# Patient Record
Sex: Female | Born: 1935 | Race: White | Hispanic: No | Marital: Married | Smoking: Never smoker
Health system: Southern US, Community
[De-identification: ages and names within clinical notes are randomized; demographics above are authoritative.]

## PROBLEM LIST (undated history)

## (undated) DIAGNOSIS — K649 Unspecified hemorrhoids: Secondary | ICD-10-CM

## (undated) DIAGNOSIS — F32A Depression, unspecified: Secondary | ICD-10-CM

## (undated) DIAGNOSIS — M8580 Other specified disorders of bone density and structure, unspecified site: Secondary | ICD-10-CM

## (undated) DIAGNOSIS — K602 Anal fissure, unspecified: Secondary | ICD-10-CM

## (undated) DIAGNOSIS — G4700 Insomnia, unspecified: Secondary | ICD-10-CM

## (undated) DIAGNOSIS — I10 Essential (primary) hypertension: Secondary | ICD-10-CM

## (undated) DIAGNOSIS — K589 Irritable bowel syndrome without diarrhea: Secondary | ICD-10-CM

## (undated) DIAGNOSIS — F419 Anxiety disorder, unspecified: Secondary | ICD-10-CM

## (undated) DIAGNOSIS — C4491 Basal cell carcinoma of skin, unspecified: Secondary | ICD-10-CM

## (undated) DIAGNOSIS — K5790 Diverticulosis of intestine, part unspecified, without perforation or abscess without bleeding: Secondary | ICD-10-CM

## (undated) DIAGNOSIS — G43909 Migraine, unspecified, not intractable, without status migrainosus: Secondary | ICD-10-CM

## (undated) DIAGNOSIS — H269 Unspecified cataract: Secondary | ICD-10-CM

## (undated) DIAGNOSIS — F329 Major depressive disorder, single episode, unspecified: Secondary | ICD-10-CM

## (undated) DIAGNOSIS — A0472 Enterocolitis due to Clostridium difficile, not specified as recurrent: Secondary | ICD-10-CM

## (undated) DIAGNOSIS — K645 Perianal venous thrombosis: Secondary | ICD-10-CM

## (undated) DIAGNOSIS — T7840XA Allergy, unspecified, initial encounter: Secondary | ICD-10-CM

## (undated) HISTORY — DX: Unspecified cataract: H26.9

## (undated) HISTORY — DX: Insomnia, unspecified: G47.00

## (undated) HISTORY — DX: Anxiety disorder, unspecified: F41.9

## (undated) HISTORY — DX: Essential (primary) hypertension: I10

## (undated) HISTORY — DX: Major depressive disorder, single episode, unspecified: F32.9

## (undated) HISTORY — PX: APPENDECTOMY: SHX54

## (undated) HISTORY — DX: Basal cell carcinoma of skin, unspecified: C44.91

## (undated) HISTORY — DX: Anal fissure, unspecified: K60.2

## (undated) HISTORY — DX: Diverticulosis of intestine, part unspecified, without perforation or abscess without bleeding: K57.90

## (undated) HISTORY — DX: Other specified disorders of bone density and structure, unspecified site: M85.80

## (undated) HISTORY — DX: Allergy, unspecified, initial encounter: T78.40XA

## (undated) HISTORY — DX: Perianal venous thrombosis: K64.5

## (undated) HISTORY — PX: BASAL CELL CARCINOMA EXCISION: SHX1214

## (undated) HISTORY — DX: Depression, unspecified: F32.A

## (undated) HISTORY — DX: Unspecified hemorrhoids: K64.9

---

## 1997-09-14 HISTORY — PX: CATARACT EXTRACTION, BILATERAL: SHX1313

## 1998-08-30 ENCOUNTER — Other Ambulatory Visit: Admission: RE | Admit: 1998-08-30 | Discharge: 1998-08-30 | Payer: Self-pay | Admitting: Internal Medicine

## 1999-09-05 ENCOUNTER — Other Ambulatory Visit: Admission: RE | Admit: 1999-09-05 | Discharge: 1999-09-05 | Payer: Self-pay | Admitting: Internal Medicine

## 1999-09-15 HISTORY — PX: COLON RESECTION: SHX5231

## 1999-09-18 ENCOUNTER — Encounter: Admission: RE | Admit: 1999-09-18 | Discharge: 1999-09-18 | Payer: Self-pay | Admitting: Psychiatry

## 1999-09-18 ENCOUNTER — Encounter: Payer: Self-pay | Admitting: Neurology

## 1999-11-12 ENCOUNTER — Inpatient Hospital Stay (HOSPITAL_COMMUNITY): Admission: AD | Admit: 1999-11-12 | Discharge: 1999-11-15 | Payer: Self-pay | Admitting: Gastroenterology

## 1999-11-12 ENCOUNTER — Encounter: Payer: Self-pay | Admitting: Gastroenterology

## 2000-01-12 ENCOUNTER — Encounter (HOSPITAL_BASED_OUTPATIENT_CLINIC_OR_DEPARTMENT_OTHER): Payer: Self-pay | Admitting: Internal Medicine

## 2000-01-12 ENCOUNTER — Encounter: Admission: RE | Admit: 2000-01-12 | Discharge: 2000-01-12 | Payer: Self-pay | Admitting: Internal Medicine

## 2000-01-15 ENCOUNTER — Encounter: Admission: RE | Admit: 2000-01-15 | Discharge: 2000-01-15 | Payer: Self-pay | Admitting: Internal Medicine

## 2000-01-15 ENCOUNTER — Encounter (HOSPITAL_BASED_OUTPATIENT_CLINIC_OR_DEPARTMENT_OTHER): Payer: Self-pay | Admitting: Internal Medicine

## 2000-01-31 ENCOUNTER — Encounter: Payer: Self-pay | Admitting: Emergency Medicine

## 2000-01-31 ENCOUNTER — Inpatient Hospital Stay (HOSPITAL_COMMUNITY): Admission: EM | Admit: 2000-01-31 | Discharge: 2000-02-01 | Payer: Self-pay | Admitting: Emergency Medicine

## 2000-03-18 ENCOUNTER — Encounter: Payer: Self-pay | Admitting: *Deleted

## 2000-03-18 ENCOUNTER — Encounter (INDEPENDENT_AMBULATORY_CARE_PROVIDER_SITE_OTHER): Payer: Self-pay | Admitting: *Deleted

## 2000-03-18 ENCOUNTER — Inpatient Hospital Stay (HOSPITAL_COMMUNITY): Admission: RE | Admit: 2000-03-18 | Discharge: 2000-03-26 | Payer: Self-pay | Admitting: *Deleted

## 2001-01-24 ENCOUNTER — Encounter (HOSPITAL_BASED_OUTPATIENT_CLINIC_OR_DEPARTMENT_OTHER): Payer: Self-pay | Admitting: Internal Medicine

## 2001-01-24 ENCOUNTER — Encounter: Admission: RE | Admit: 2001-01-24 | Discharge: 2001-01-24 | Payer: Self-pay | Admitting: Internal Medicine

## 2001-01-31 ENCOUNTER — Encounter (HOSPITAL_BASED_OUTPATIENT_CLINIC_OR_DEPARTMENT_OTHER): Payer: Self-pay | Admitting: Internal Medicine

## 2001-01-31 ENCOUNTER — Encounter: Admission: RE | Admit: 2001-01-31 | Discharge: 2001-01-31 | Payer: Self-pay | Admitting: Internal Medicine

## 2002-01-17 ENCOUNTER — Encounter: Payer: Self-pay | Admitting: Internal Medicine

## 2002-01-17 ENCOUNTER — Encounter: Admission: RE | Admit: 2002-01-17 | Discharge: 2002-01-17 | Payer: Self-pay | Admitting: Internal Medicine

## 2003-02-01 ENCOUNTER — Encounter: Payer: Self-pay | Admitting: Internal Medicine

## 2003-02-01 ENCOUNTER — Encounter: Admission: RE | Admit: 2003-02-01 | Discharge: 2003-02-01 | Payer: Self-pay | Admitting: Internal Medicine

## 2004-02-20 ENCOUNTER — Encounter: Admission: RE | Admit: 2004-02-20 | Discharge: 2004-02-20 | Payer: Self-pay | Admitting: Internal Medicine

## 2005-02-25 ENCOUNTER — Encounter: Admission: RE | Admit: 2005-02-25 | Discharge: 2005-02-25 | Payer: Self-pay | Admitting: Internal Medicine

## 2005-09-14 HISTORY — PX: TRANSANAL EXCISION OF RECTAL MASS WITH HEMORRHOID INJECTION: SHX6135

## 2006-03-03 ENCOUNTER — Encounter: Admission: RE | Admit: 2006-03-03 | Discharge: 2006-03-03 | Payer: Self-pay | Admitting: Internal Medicine

## 2007-03-07 ENCOUNTER — Encounter: Admission: RE | Admit: 2007-03-07 | Discharge: 2007-03-07 | Payer: Self-pay | Admitting: Internal Medicine

## 2007-03-16 ENCOUNTER — Encounter: Admission: RE | Admit: 2007-03-16 | Discharge: 2007-03-16 | Payer: Self-pay | Admitting: Internal Medicine

## 2007-09-19 ENCOUNTER — Encounter: Admission: RE | Admit: 2007-09-19 | Discharge: 2007-09-19 | Payer: Self-pay | Admitting: Internal Medicine

## 2008-03-07 ENCOUNTER — Encounter: Admission: RE | Admit: 2008-03-07 | Discharge: 2008-03-07 | Payer: Self-pay | Admitting: Internal Medicine

## 2009-03-08 ENCOUNTER — Encounter: Admission: RE | Admit: 2009-03-08 | Discharge: 2009-03-08 | Payer: Self-pay | Admitting: Internal Medicine

## 2010-03-10 ENCOUNTER — Encounter: Admission: RE | Admit: 2010-03-10 | Discharge: 2010-03-10 | Payer: Self-pay | Admitting: Internal Medicine

## 2011-01-30 NOTE — Op Note (Signed)
[place] [facility]  Patient:    Christina Escobar, Christina Escobar                    MRN: [id number] Proc. Date: 03/19/00 Adm. Date:  [id number] Attending:  Daine Gip CC:         Genene Churn. Sherin Quarry, M.D.             Jacqulynn Cadet Jennelle Human, M.D.             Thomas B. Samuella Cota, M.D.                           Operative Report  CCS NUMBER:  [id number].  PREOPERATIVE DIAGNOSIS:  Chronic sigmoid diverticulitis with stenosis.  POSTOPERATIVE DIAGNOSIS:  Chronic sigmoid diverticulitis with stenosis.  OPERATION:  Sigmoid colectomy, incidental appendectomy.  SURGEON:  Maisie Fus B. Samuella Cota, M.D.  ASSISTANTSheppard Plumber. Earlene Plater, M.D.  ANESTHESIA:  General, anesthesiologist and CRNA.  DESCRIPTION OF PROCEDURE:  Patient was taken to the operating room, placed on the table in supine position and after satisfactory general anesthetic with intubation, a Foley catheter was inserted and the entire abdomen was prepped and draped into a sterile field.  A midline incision was made just below the umbilicus to the suprapubic area.  The incision was taken through skin and subcutaneous tissue and the midline fascia was divided.  Exploration revealed a normal-feeling stomach, liver and the gallbladder felt normal without stones.  The small bowel was then run from the ligament of Treitz to the ileocecal valve, with no abnormality being noted.  Patient had a fair along appendix and an incidental appendectomy was carried out.  The mesoappendix was cross-clamped, divided and ligated with 2-0 black silk.  The appendix was then clamped and amputated.  The stump was tied with a 2-0 chromic catgut.  The stump was then inverted with a pursestring suture of 3-0 black silk.  The patient had adhesions of the sigmoid colon to the lateral peritoneal reflection.  Sigmoid colon was fairly redundant.  The adhesions were taken down.  The left ureter was identified.  A 20-cm segment of sigmoid colon was then removed  by dividing the mesocolon and ligating with 2-0 black silk; the bowel proximal appeared normal as did the distal bowel.  The bowel was divided probably at the area of the rectosigmoid.  The specimen was removed.  The anastomosis was then carried out as a single-layer end-to-end anastomosis using 2-0 silk, with the knots tied on the inside, except the last few sutures which were placed as Gambee sutures.  Liberal anastomosis was created.  The proximal crushed end of the bowel had been excised and the distal bowel was never clamped.  There appeared to be no undue tension on the anastomosis.  The mesenteric defect was closed with a running suture of 2-0 chromic catgut. Patient had normal-appearing tubes and ovaries and the uterus was small with only a couple of small fibroids.  The abdomen was copiously irrigated with saline and hemostasis was obtained.  The omentum and transverse colon were then placed over the small bowel.  The midline incision was closed with two running sutures of #1 PDS, with the knot tied in the middle.  The wound was copiously irrigated with saline and skin was closed with the skin staples. Dry sterile dressing was applied.  Patient seemed to tolerate the procedure well and was taken to the PACU in satisfactory condition.DD:  03/19/00  TD:  03/19/00 Job: [id number]

## 2011-01-30 NOTE — Discharge Summary (Signed)
[place]. [facility]  Patient:    Christina Escobar, Christina Escobar                    MRN: [id number] Adm. Date:  [id number] Disc. Date: [id number] Attending:  Daine Gip                           Discharge Summary  There was no dictation for this report. DD:  04/12/00 TD:  04/13/00 Job: [id number]

## 2011-01-30 NOTE — Discharge Summary (Signed)
[facility]  Patient:    Christina Escobar, Christina Escobar                    MRN: [id number] Adm. Date:  [id number] Disc. Date: [id number] Attending:  Daine Gip CC:         Genene Churn. Sherin Quarry, M.D.                           Discharge Summary  ADMITTING DIAGNOSES: Left lower abdominal pain, possible diverticulitis.  FINAL DIAGNOSES:  Left lower quadrant pain, possible diverticulitis, improved.  PROCEDURE:  CT of the abdomen and pelvis.  BRIEF HISTORY:  The patient was admitted overnight for left-sided abdominal pain. She had a history in the past of abdominal pain that was presumed to be diverticulitis. She was admitted with 1 day of severe lower abdominal pain when she came into the emergency room.  PHYSICAL EXAMINATION:  VITAL SIGNS:  Temperature of 100.3.  HEART:  Normal.  LUNGS:  Normal.  ABDOMEN:  Soft with good bowel sounds. Mild tenderness in the left lower quadrant with possible early rebound.  RECTAL:  There was a posterior anal fissure that was not actively bleeding.  For more details, please see dictated admission history and physical.  HOSPITAL COURSE:  The patient was admitted to the medical floor and placed on IV Unasyn. She was only allowed clear liquids. White count was 15.5 and subsequently the white count the following morning was 12.4.  CT scan showed mild diverticulosis without actual diverticulitis. By the following morning, the patient was clinically feeling much better and was tolerating liquids. Her exam was much less tender. The feeling was that she may well have had mild diverticulitis.  DISPOSITION:  The patient is discharged home to see Dr. Sherin Quarry in the office the following week.  DISCHARGE MEDICATIONS:  Augmentin 500 mg t.i.d. She will advance her diet as tolerated and continue all of her outpatient medicines. DD:  02/19/00 TD:  02/25/00 Job: [id number]

## 2011-01-30 NOTE — Discharge Summary (Signed)
[place]. [facility]  Patient:    Christina Escobar, Christina Escobar                    MRN: [id number] Adm. Date:  [id number] Disc. Date: [id number] Attending:  Daine Gip CC:         Genene Churn. Sherin Quarry, M.D.             Thomas B. Samuella Cota, M.D.                           Discharge Summary  CHIEF COMPLAINT:  Colonic diverticulitis.  HISTORY OF PRESENT ILLNESS:  This 77 year old white female was seen in consultation with Dr. Tasia Catchings.  The patient gives a history of developing clinical sigmoid diverticulitis in 1996.  She underwent a colonoscopy at that time, which showed diverticulosis.  She has never had a barium enema.  She has had further attacks of right lower quadrant pain associated with low-grade fever with elevated white count.  She had an attack in November 1999 and was hospitalized at [facility] from November 12, 1999 to November 15, 1999 and had another attack starting Jan 31, 2000, when she was seen in the emergency room and kept overnight and received IV fluids.  She began having some left lower quadrant abdominal pain in early June.  The patient was seen in the office on February 26, 2000.  At that time, she was on Septra DS two tablets a day.  The patient had a CT scan of the abdomen and pelvis in February 2001 when she was at [facility].  The patient describes her pain as being colicky abdominal pain associated with a temperature of usually around 100.  As mentioned above, she has never had a barium enema. Dr. Sherin Quarry thought it was reasonable to plan colonoscopy and then have her admitted to the hospital the same day with surgery planned the next day.  The patient has undergone colonoscopy today by Dr. Sherin Quarry and is now admitted for surgery tomorrow.  ALLERGIES:  No medication produces a rash but when she was taking CIPRO and FLAGYL for diverticulitis she developed severe diarrhea and was taken off of that after she was admitted to the  hospital.  CURRENT MEDICATIONS: 1. Accupril 40 mg daily. 2. Lazol 25 mg on Monday, Wednesday, and Friday. 3. Evista 60 mg daily. 4. Lorazepam 1 mg twice a day. 5. Remeron 30 mg daily for anxiety. 6. Imitrex 25 mg p.r.n. for headaches. 7. Ambien 10 mg h.s. p.r.n.  PAST SURGICAL HISTORY:  Bilateral cataract surgery in 1999.  HOSPITALIZATIONS:  Only in February 2001 at [facility] with diverticulitis.  REVIEW OF SYSTEMS:  She has had good results from her cataract surgery.  No recent sore throats or ear infections.  No history of thyroid disease.  Does not smoke and has never smoked.  No history of any cardiac problems.  The patient has never been jaundiced, had hepatitis, or infectious mono.  Bowel elimination is usually two to three times a day.  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  She is not using suppositories.  She has nocturia x 2.  SOCIAL HISTORY:  Her husband is 51 years old, living and well.  He is a retired Development worker, international aid.  They have one adopted son.  They also have a son and daughter.  PHYSICAL EXAMINATION:  VITAL  SIGNS:  Weight 102 pounds, temperature 97.  GENERAL:  The patient is a well-developed, well-nourished white female in no acute distress.  NECK:  Supple without thyroid enlargement.  CHEST:  Clear.  HEART:  No murmurs.  BREASTS:  No masses felt in either breast or axilla.  ABDOMEN:  Liver, spleen, and kidney not felt.  The patient had some minimal tenderness in the left lower quadrant of the abdomen.  Bowel sounds were normal.  RECTAL:  Only slight increase in sphincter tone.  She is somewhat tender in the posterior midline.  IMPRESSION:  Chronic sigmoid diverticulitis.  The patient had colonoscopy, as mentioned above.  She was taken to the operating room on March 19, 2000, at which time she underwent a sigmoid colectomy and an incidental appendectomy.  She had  adhesions about the sigmoid colon and about 20 cm of colon was removed.  Exploration otherwise unremarkable.  A nasogastric tube was not placed.  The patient was started on clear liquids on the third postoperative day, at which time the Foley catheter was also removed.  The patient was continued on a clear liquid diet until she was passing flatus well on the sixth postoperative day and she was progressed to a full liquid diet and was eating a regular diet on the seventh postoperative day, at which time she was discharged home improved.  The patients skin staples have been removed and the wound treated with benzoin and Steri-Strips.  There was no evidence of any wound infection.  LABORATORY DATA:  While in the hospital, it showed a hemoglobin of 11.4, hematocrit of 33.4, white count 6600.  Electrolytes were normal.  EKG showed normal sinus rhythm with first-degree AV block and an old septal infarct, age undetermined, abnormal EKG.  Chest x-ray on March 18, 2000 showed no active lung disease.  The final pathology report is on the chart and the report is as noted above.  FINAL DIAGNOSIS:  Sigmoid diverticulitis.  OPERATIONS:  March 19, 2000:  Sigmoid colectomy, incidental appendectomy.  CONDITION ON DISCHARGE:  Improved.  FOLLOW-UP:  The patient will be seen back in the office for a follow-up in about 10 days.  DISCHARGE MEDICATIONS:  She was given a prescription for Vicodin for pain. DD:  04/12/00 TD:  04/13/00 Job: [id number]

## 2011-01-30 NOTE — H&P (Signed)
[facility]  Patient:    Christina Escobar, Christina Escobar                    MRN: [id number] Adm. Date:  [id number] Disc. Date: [id number] Attending:  Daine Gip CC:         Genene Churn. Sherin Quarry, M.D.             Jonelle Sports. Cheryll Cockayne, M.D.                         History and Physical  REASON FOR ADMISSION:  Abdominal pain.  HISTORY OF PRESENT ILLNESS:  The patient is a 77 year old woman who is the wife of Dr. Junius Argyle.  She has a history of diverticulitis going back ten years  ago.  Approximately ten years ago, she had her first episode and then went about ten years or so without any further problems.  One year ago, she had en episode of diverticulitis treated clinically with improvement.  In November 2000 and February of 2001, she had subsequent episodes.  She denies chronic constipation and, in fact, since these past two episodes of diverticulitis, her stools have been on he loose side.  She was treated in February and March with medications and apparently had an MRI of her abdomen at [facility], all consistent with diverticulitis.  She was  intolerant of Cipro and Flagyl.  [place] and felt reasonably normal yesterday.  She had what she felt was a normal bowel  movement.  She was not constipated.  She had some solid stool, followed by several loose stools whenever she had a bowel movement.  She woke up at 3:00 a.m. this morning with lower abdominal pain.  She got up and went hiking, thinking this would help.  Her pain became aggressively worse hiking to the point that she was unable to continue to hike.  She and her husband drove back to [place].  She better  sitting in the car.  I checked her temperature at home and it was 100.8.  She has had no dysuria and very little flatus today.  With this history, we had her come to the ER for evaluation.  CURRENT MEDICATIONS:  Accupril 40 mg q.d.  Indapamide 2.5  mg Monday, Wednesday nd Friday.  Evista 60 mg q.d.  Lorazepam 1 mg b.i.d. p.r.n.  Imitrex p.r.n. migraine headaches.  Remeron p.r.n.  Ambien p.r.n.  ALLERGIES:  She is intolerant of Cipro and Flagyl, which made her shake and tremble.  She is intolerant of erythromycin, which gives her GI upset.  SHe has  been on penicillin in the past with no problems.  PAST MEDICAL HISTORY: 1. Hypertension. 2. History of migraine headaches. 3. Osteoporosis by bone density.  Never any previous symptoms.  PAST SURGICAL HISTORY:  Cataract surgery.  She has never had any abdominal surgery.  FAMILY HISTORY:  Her mother died of heart failure.  Her father died of Alzheimers. There is no family history of GI cancer.  Family history is negative for renal stones.  REVIEW OF SYSTEMS:  Remarkable for no significant heart disease.  She has had anal pain and occasional bleeding with bowel movement.  This has been going on several times over the past six months and has never really completely healed.  LABORATORY DATA:  Current available data reveals a white count of 15,000.  Acute abdominal series shows no evidence of free air.  PHYSICAL EXAMINATION;  VITAL SIGNS:  Temperature 100.3.  Pulse 107.  Blood pressure 138/84.  GENERAL:  Alert white female in no acute distress.  HEENT:  Sclerae anicteric.  Extraocular movements grossly intact.  Cerumen occluding the right ear canal.  Throat - No evidence of thrush.  Mucous membranes appear normal.  LUNGS:  Clear anteriorly and posteriorly.  HEART:  Regular rate and rhythm without murmurs or gallops.  ABDOMEN:  Soft with good bowel sounds.  There is tenderness in the lower abdomen with possible rebound that appears to be really in the midline, possibly more to the right.  RECTAL:  Posterior anal fissure that is not actively bleeding.  Stool brown and  heme negative.  There is tenderness in the rectal exam, more on the right than n the  left.  ASSESSMENT: 1. Lower abdominal pain, probably diverticulitis.  There is a questionable fullness    on the abdominal series and with tenderness on the right side as well as the    left.  I would be concerned about appendicitis, as well.  There is no free air    and no evidence of any acute perforation. 2. Posterior anal fissure.  PLAN: 1. Will go ahead and treat her empirically with Unasyn. 2. Will give her only sips of clear liquids. 3. Will obtain a CT of the abdomen and pelvis to rule out appendicitis. DD:  01/31/00 TD:  02/02/00 Job: [id number]

## 2011-02-12 ENCOUNTER — Other Ambulatory Visit: Payer: Self-pay | Admitting: Internal Medicine

## 2011-02-12 DIAGNOSIS — Z1231 Encounter for screening mammogram for malignant neoplasm of breast: Secondary | ICD-10-CM

## 2011-03-17 ENCOUNTER — Ambulatory Visit
Admission: RE | Admit: 2011-03-17 | Discharge: 2011-03-17 | Disposition: A | Discharge status: Home / Self Care | Payer: Medicare Other | Source: Ambulatory Visit | Attending: Internal Medicine | Admitting: Internal Medicine

## 2011-03-17 DIAGNOSIS — Z1231 Encounter for screening mammogram for malignant neoplasm of breast: Secondary | ICD-10-CM

## 2011-11-05 ENCOUNTER — Encounter (INDEPENDENT_AMBULATORY_CARE_PROVIDER_SITE_OTHER): Payer: Self-pay | Admitting: Surgery

## 2011-11-05 ENCOUNTER — Ambulatory Visit (INDEPENDENT_AMBULATORY_CARE_PROVIDER_SITE_OTHER): Payer: Medicare Other | Admitting: Surgery

## 2011-11-05 VITALS — BP 100/64 | HR 62 | Temp 96.8°F | Resp 16 | Ht 63.0 in | Wt 109.0 lb

## 2011-11-05 DIAGNOSIS — K645 Perianal venous thrombosis: Secondary | ICD-10-CM | POA: Insufficient documentation

## 2011-11-05 HISTORY — PX: INCISION AND DRAINAGE: SHX5863

## 2011-11-05 NOTE — Progress Notes (Signed)
Chief Complaint  Patient presents with  . New Evaluation    Thrombosed hemorrhoids - referral from Dr. Odelia Gage, [facility]    HISTORY: The patient is a 78 year old white female referred by her gastroenterologist for newly diagnosed thrombosed external hemorrhoids. Patient has previously been treated in our practice by my partner for internal hemorrhoid disease. That was approximately 6 years ago. Patient has not had prior external thrombosed hemorrhoids. Approximately 2 days ago following exercise she developed pain and swelling. She has had no bleeding. She was seen yesterday by her gastroenterologist and referred to our practice for evaluation.  Past Medical History  Diagnosis Date  . Hypertension   . Thrombosed hemorrhoids   . Cataracts, bilateral      Current Outpatient Prescriptions  Medication Sig Dispense Refill  . acetaminophen (TYLENOL) 500 MG tablet Take 500 mg by mouth as needed.      . Calcium Carbonate-Vitamin D (CALTRATE 600+D PO) Take by mouth 2 (two) times daily.      Marland Kitchen EVISTA 60 MG tablet Daily.      . Hydrocortisone Acetate (PROCTOZONE H RE) Place rectally as needed.      . indapamide (LOZOL) 2.5 MG tablet Daily.      . Loperamide HCl (IMODIUM PO) Take by mouth as needed.      Marland Kitchen LORazepam (ATIVAN) 0.5 MG tablet Daily.      . mirtazapine (REMERON) 45 MG tablet Daily.      . Multiple Vitamins-Minerals (CENTRUM SILVER PO) Take by mouth daily.      . polyethylene glycol powder (GLYCOLAX/MIRALAX) powder Daily.      . psyllium (METAMUCIL) 58.6 % packet Take 1 packet by mouth daily.      . quinapril (ACCUPRIL) 40 MG tablet Daily.      . SUMAtriptan (IMITREX) 25 MG tablet Take 25 mg by mouth as needed.      . zolpidem (AMBIEN) 10 MG tablet as needed.          No Known Allergies   Family History  Problem Relation Age of Onset  . Alzheimer's disease Mother   . Heart disease Father      History   Social History  . Marital Status: Married   Spouse Name: N/A    Number of Children: N/A  . Years of Education: N/A   Social History Main Topics  . Smoking status: Never Smoker   . Smokeless tobacco: Never Used  . Alcohol Use: Yes     occasional glass of wine  . Drug Use: No  . Sexually Active: None   Other Topics Concern  . None   Social History Narrative  . None     REVIEW OF SYSTEMS - PERTINENT POSITIVES ONLY: History of internal hemorrhoids. Previous colonoscopy. No recent bleeding. Moderate pain.  EXAM: Filed Vitals:   11/05/11 1515  BP: 100/64  Pulse: 62  Temp: 96.8 F (36 C)  Resp: 16    HEENT: normocephalic; pupils equal and reactive; sclerae clear; dentition good; mucous membranes moist NECK:  symmetric on extension; no palpable anterior or posterior cervical lymphadenopathy; no supraclavicular masses; no tenderness CHEST: clear to auscultation bilaterally without rales, rhonchi, or wheezes CARDIAC: regular rate and rhythm without significant murmur; peripheral pulses are full RECTAL: External examination shows an obvious thrombosed external hemorrhoid in the right mid anal verge. There is slight edema and prolapse in the posterior midline. There is no ulceration and no sign of bleeding. There is moderate tenderness. EXT:  non-tender without edema;  no deformity NEURO: no gross focal deficits; no sign of tremor   LABORATORY RESULTS: See [facility] (CHL-Epic) for most recent results   RADIOLOGY RESULTS: See [facility] (CHL-Epic) for most recent results  Procedure: Under aseptic conditions local anesthetic is infiltrated throughout the area. Using the iris scissors an elliptical incision is made. The vein is opened and a moderate amount of thrombus was evacuated as was some of the ectatic vein.  Dry gauze dressing is placed. Patient tolerated the procedure well.   IMPRESSION: #1 thrombosed external hemorrhoid #2 history of internal hemorrhoids  PLAN: Usual instructions for local wound  care are given to the patient. Patient will return as needed.  Velora Heckler, MD, FACS General & Endocrine Surgery [facility]   Visit Diagnoses: 1. Hemorrhoids, external, thrombosed     Primary Care Physician: Gaspar Garbe, MD, MD  GI:  Dr. Odelia Gage, [facility]

## 2011-11-05 NOTE — Patient Instructions (Signed)
ANORECTAL PROCEDURES: 1.  Tub soaks 2-3 times daily in warm water (may add Epsom salts if desired) 2.  Stool softener for one month (store brand Miralax or Colace) 3.  Avoid toilet paper - use baby wipes or Tucks pads 4.  Increase water intake - 6-8 glasses daily 5.  Apply dry pad to area until drainage stops 

## 2011-12-22 ENCOUNTER — Other Ambulatory Visit: Payer: Self-pay | Admitting: Internal Medicine

## 2011-12-22 DIAGNOSIS — N63 Unspecified lump in unspecified breast: Secondary | ICD-10-CM

## 2011-12-25 ENCOUNTER — Ambulatory Visit
Admission: RE | Admit: 2011-12-25 | Discharge: 2011-12-25 | Disposition: A | Discharge status: Home / Self Care | Payer: Medicare Other | Source: Ambulatory Visit | Attending: Internal Medicine | Admitting: Internal Medicine

## 2011-12-25 DIAGNOSIS — N63 Unspecified lump in unspecified breast: Secondary | ICD-10-CM

## 2012-02-24 ENCOUNTER — Other Ambulatory Visit: Payer: Self-pay | Admitting: Internal Medicine

## 2012-02-24 DIAGNOSIS — Z1231 Encounter for screening mammogram for malignant neoplasm of breast: Secondary | ICD-10-CM

## 2012-04-11 ENCOUNTER — Ambulatory Visit
Admission: RE | Admit: 2012-04-11 | Discharge: 2012-04-11 | Disposition: A | Discharge status: Home / Self Care | Payer: Medicare Other | Source: Ambulatory Visit | Attending: Internal Medicine | Admitting: Internal Medicine

## 2012-04-11 DIAGNOSIS — Z1231 Encounter for screening mammogram for malignant neoplasm of breast: Secondary | ICD-10-CM

## 2012-07-25 ENCOUNTER — Ambulatory Visit
Admission: RE | Admit: 2012-07-25 | Discharge: 2012-07-25 | Disposition: A | Discharge status: Home / Self Care | Payer: Medicare Other | Source: Ambulatory Visit | Attending: Gastroenterology | Admitting: Gastroenterology

## 2012-07-25 ENCOUNTER — Other Ambulatory Visit: Payer: Self-pay | Admitting: Gastroenterology

## 2012-07-25 DIAGNOSIS — K5900 Constipation, unspecified: Secondary | ICD-10-CM

## 2012-09-13 ENCOUNTER — Encounter (HOSPITAL_BASED_OUTPATIENT_CLINIC_OR_DEPARTMENT_OTHER): Payer: Self-pay | Admitting: *Deleted

## 2012-09-13 ENCOUNTER — Emergency Department (HOSPITAL_BASED_OUTPATIENT_CLINIC_OR_DEPARTMENT_OTHER): Payer: Medicare Other

## 2012-09-13 ENCOUNTER — Emergency Department (HOSPITAL_BASED_OUTPATIENT_CLINIC_OR_DEPARTMENT_OTHER)
Admission: EM | Admit: 2012-09-13 | Discharge: 2012-09-14 | Disposition: A | Discharge status: Home / Self Care | Payer: Medicare Other | Attending: Emergency Medicine | Admitting: Emergency Medicine

## 2012-09-13 DIAGNOSIS — Z961 Presence of intraocular lens: Secondary | ICD-10-CM | POA: Insufficient documentation

## 2012-09-13 DIAGNOSIS — Z79899 Other long term (current) drug therapy: Secondary | ICD-10-CM | POA: Insufficient documentation

## 2012-09-13 DIAGNOSIS — Z8669 Personal history of other diseases of the nervous system and sense organs: Secondary | ICD-10-CM | POA: Insufficient documentation

## 2012-09-13 DIAGNOSIS — Z8719 Personal history of other diseases of the digestive system: Secondary | ICD-10-CM | POA: Insufficient documentation

## 2012-09-13 DIAGNOSIS — Z9849 Cataract extraction status, unspecified eye: Secondary | ICD-10-CM | POA: Insufficient documentation

## 2012-09-13 DIAGNOSIS — G43909 Migraine, unspecified, not intractable, without status migrainosus: Secondary | ICD-10-CM | POA: Insufficient documentation

## 2012-09-13 DIAGNOSIS — R110 Nausea: Secondary | ICD-10-CM | POA: Insufficient documentation

## 2012-09-13 DIAGNOSIS — I10 Essential (primary) hypertension: Secondary | ICD-10-CM | POA: Insufficient documentation

## 2012-09-13 HISTORY — DX: Migraine, unspecified, not intractable, without status migrainosus: G43.909

## 2012-09-13 MED ORDER — DIPHENHYDRAMINE HCL 50 MG/ML IJ SOLN
25.0000 mg | Freq: Once | INTRAMUSCULAR | Status: AC
Start: 2012-09-13 — End: 2012-09-13
  Administered 2012-09-13: 25 mg via INTRAVENOUS
  Filled 2012-09-13: qty 1

## 2012-09-13 MED ORDER — DEXAMETHASONE SODIUM PHOSPHATE 10 MG/ML IJ SOLN
10.0000 mg | Freq: Once | INTRAMUSCULAR | Status: AC
  Administered 2012-09-13: 10 mg via INTRAVENOUS
  Filled 2012-09-13: qty 1

## 2012-09-13 MED ORDER — OXYCODONE-ACETAMINOPHEN 5-325 MG PO TABS: 2.0000 | ORAL_TABLET | ORAL | Status: DC | PRN

## 2012-09-13 MED ORDER — MORPHINE SULFATE 4 MG/ML IJ SOLN
4.0000 mg | Freq: Once | INTRAMUSCULAR | Status: AC
  Administered 2012-09-13: 4 mg via INTRAVENOUS
  Filled 2012-09-13: qty 1

## 2012-09-13 MED ORDER — METOCLOPRAMIDE HCL 5 MG/ML IJ SOLN
10.0000 mg | Freq: Once | INTRAMUSCULAR | Status: AC
Start: 2012-09-13 — End: 2012-09-13
  Administered 2012-09-13: 10 mg via INTRAVENOUS
  Filled 2012-09-13: qty 2

## 2012-09-13 MED ORDER — SODIUM CHLORIDE 0.9 % IV SOLN
Freq: Once | INTRAVENOUS | Status: AC
  Administered 2012-09-13: 21:00:00 via INTRAVENOUS

## 2012-09-13 NOTE — ED Notes (Signed)
Pt with migraine HA since Friday pt has been using Imitrex without relief pt also with nausea

## 2012-09-13 NOTE — ED Notes (Addendum)
Pt states that she has been taking imitrex so frequently that she was told by her PCP not to take anymore and to come to ED

## 2012-09-13 NOTE — ED Provider Notes (Signed)
History     CSN: [id number]  Arrival date & time 09/13/12  1742   First MD Initiated Contact with Patient 09/13/12 1915      Chief Complaint  Patient presents with  . Migraine    (Consider location/radiation/quality/duration/timing/severity/associated sxs/prior treatment) HPI Comments: Patient presents with a headache. She has a history of migraines headaches that she's had for the majority of her life. She typically uses Imitrex with good relief. She states that for about the last week she's had increased frequency of her headaches and is been using Imitrex daily. She states that Imitrex will take the headache away for a few hours and then it tends to come back again. She describes the pain as a throbbing pain across her temples which is in the same location that she typically has her migraines. She denies any neck pain or stiffness. She denies any fevers or chills. She denies any recent head injuries. She denies any neurologic deficits or vision changes. Denies any balance problems. She states that she had a onset of a headache this morning. It subsequently went away but then came back again this afternoon. She states it's a normal type pain that she has with her migraines but she hasn't had one this bad since she started taking Imitrex.  Patient is a 78 y.o. female presenting with migraines.  Migraine Associated symptoms include headaches. Pertinent negatives include no chest pain, no abdominal pain and no shortness of breath.    Past Medical History  Diagnosis Date  . Hypertension   . Thrombosed hemorrhoids   . Cataracts, bilateral   . Migraine     Past Surgical History  Procedure Date  . Eye surgery 1999    bilateral cataract removal  . Colon surgery 2001    colon resection    Family History  Problem Relation Age of Onset  . Alzheimer's disease Mother   . Heart disease Father     History  Substance Use Topics  . Smoking status: Never Smoker   . Smokeless tobacco:  Never Used  . Alcohol Use: Yes     Comment: occasional glass of wine    OB History    Grav Para Term Preterm Abortions TAB SAB Ect Mult Living                  Review of Systems  Constitutional: Negative for fever, chills, diaphoresis and fatigue.  HENT: Negative for congestion, rhinorrhea, sneezing and neck pain.   Eyes: Negative.   Respiratory: Negative for cough, chest tightness and shortness of breath.   Cardiovascular: Negative for chest pain and leg swelling.  Gastrointestinal: Positive for nausea. Negative for vomiting, abdominal pain, diarrhea and blood in stool.  Genitourinary: Negative for frequency, hematuria, flank pain and difficulty urinating.  Musculoskeletal: Negative for back pain and arthralgias.  Skin: Negative for rash.  Neurological: Positive for headaches. Negative for dizziness, speech difficulty, weakness and numbness.    Allergies  Review of patient's allergies indicates no known allergies.  Home Medications   Current Outpatient Rx  Name  Route  Sig  Dispense  Refill  . ACETAMINOPHEN 500 MG PO TABS   Oral   Take 500 mg by mouth as needed.         Marland Kitchen CALTRATE 600+D PO   Oral   Take by mouth 2 (two) times daily.         Marland Kitchen EVISTA 60 MG PO TABS      Daily.         Marland Kitchen  PROCTOZONE H RE   Rectal   Place rectally as needed.         . INDAPAMIDE 2.5 MG PO TABS      Daily.         . IMODIUM PO   Oral   Take by mouth as needed.         Marland Kitchen LORAZEPAM 0.5 MG PO TABS      Daily.         Marland Kitchen MIRTAZAPINE 45 MG PO TABS      Daily.         . CENTRUM SILVER PO   Oral   Take by mouth daily.         . OXYCODONE-ACETAMINOPHEN 5-325 MG PO TABS   Oral   Take 2 tablets by mouth every 4 (four) hours as needed for pain.   6 tablet   0   . POLYETHYLENE GLYCOL 3350 PO POWD      Daily.         . PSYLLIUM 58.6 % PO PACK   Oral   Take 1 packet by mouth daily.         . QUINAPRIL HCL 40 MG PO TABS      Daily.         .  SUMATRIPTAN SUCCINATE 25 MG PO TABS   Oral   Take 25 mg by mouth as needed.         Marland Kitchen ZOLPIDEM TARTRATE 10 MG PO TABS      as needed.            BP 124/63  Pulse 81  Temp 97.9 F (36.6 C) (Oral)  Resp 16  SpO2 96%  Physical Exam  Constitutional: She is oriented to person, place, and time. She appears well-developed and well-nourished.  HENT:  Head: Normocephalic and atraumatic.  Eyes: Pupils are equal, round, and reactive to light.  Neck: Normal range of motion. Neck supple.       No meningeal signs  Cardiovascular: Normal rate, regular rhythm and normal heart sounds.   Pulmonary/Chest: Effort normal and breath sounds normal. No respiratory distress. She has no wheezes. She has no rales. She exhibits no tenderness.  Abdominal: Soft. Bowel sounds are normal. There is no tenderness. There is no rebound and no guarding.  Musculoskeletal: Normal range of motion. She exhibits no edema.  Lymphadenopathy:    She has no cervical adenopathy.  Neurological: She is alert and oriented to person, place, and time. She has normal strength. No cranial nerve deficit or sensory deficit. GCS eye subscore is 4. GCS verbal subscore is 5. GCS motor subscore is 6.  Skin: Skin is warm and dry. No rash noted.  Psychiatric: She has a normal mood and affect.    ED Course  Procedures (including critical care time)  Labs Reviewed - No data to display Ct Head Wo Contrast  09/13/2012  *RADIOLOGY REPORT*  Clinical Data: Headache  CT HEAD WITHOUT CONTRAST  Technique:  Contiguous axial images were obtained from the base of the skull through the vertex without contrast.  Comparison: None.  Findings: There is no evidence for acute hemorrhage, hydrocephalus, mass lesion, or abnormal extra-axial fluid collection.  No definite CT evidence for acute infarction.  Diffuse loss of parenchymal volume is consistent with atrophy. Patchy low attenuation in the deep hemispheric and periventricular white matter is  nonspecific, but likely reflects chronic microvascular ischemic demyelination. The visualized paranasal sinuses and mastoid air cells are clear.  IMPRESSION:  No acute intracranial abnormality.  Atrophy with chronic small vessel white matter ischemic demyelination.   Original Report Authenticated By: Kennith Center, M.D.      1. Migraine       MDM  Patient was given migraine cocktail and then morphine for her pain. She's feeling much better after that and says her headache is almost completely resolved. Her pain is consistent with her past migraines. She did have an increased frequency of her migraines I did do a head CT which was unremarkable. However she has no neck pain or other atypical symptoms suggestive of subarachnoid hemorrhage or meningitis. I advised her followup with her primary care physician within 2 days if her symptoms are not improving or return here as needed for any worsening symptoms or if her headache becomes atypical.        Rolan Bucco, MD 09/13/12 2317

## 2012-09-14 ENCOUNTER — Emergency Department (HOSPITAL_BASED_OUTPATIENT_CLINIC_OR_DEPARTMENT_OTHER)
Admission: EM | Admit: 2012-09-14 | Discharge: 2012-09-14 | Disposition: A | Discharge status: Home / Self Care | Payer: Medicare Other | Attending: Emergency Medicine | Admitting: Emergency Medicine

## 2012-09-14 ENCOUNTER — Encounter (HOSPITAL_BASED_OUTPATIENT_CLINIC_OR_DEPARTMENT_OTHER): Payer: Self-pay | Admitting: *Deleted

## 2012-09-14 DIAGNOSIS — Z8669 Personal history of other diseases of the nervous system and sense organs: Secondary | ICD-10-CM | POA: Insufficient documentation

## 2012-09-14 DIAGNOSIS — I10 Essential (primary) hypertension: Secondary | ICD-10-CM | POA: Insufficient documentation

## 2012-09-14 DIAGNOSIS — Z79899 Other long term (current) drug therapy: Secondary | ICD-10-CM | POA: Insufficient documentation

## 2012-09-14 DIAGNOSIS — R51 Headache: Secondary | ICD-10-CM

## 2012-09-14 DIAGNOSIS — Z8719 Personal history of other diseases of the digestive system: Secondary | ICD-10-CM | POA: Insufficient documentation

## 2012-09-14 DIAGNOSIS — Z8679 Personal history of other diseases of the circulatory system: Secondary | ICD-10-CM | POA: Insufficient documentation

## 2012-09-14 HISTORY — DX: Irritable bowel syndrome, unspecified: K58.9

## 2012-09-14 LAB — CBC WITH DIFFERENTIAL/PLATELET
Basophils Absolute: 0 K/uL (ref 0.0–0.1)
Basophils Relative: 0 % (ref 0–1)
Eosinophils Absolute: 0 10*3/uL (ref 0.0–0.7)
Eosinophils Relative: 0 % (ref 0–5)
HCT: 35.2 % — ABNORMAL LOW (ref 36.0–46.0)
Hemoglobin: 12 g/dL (ref 12.0–15.0)
Lymphocytes Relative: 12 % (ref 12–46)
Lymphs Abs: 1.5 K/uL (ref 0.7–4.0)
MCH: 31.5 pg (ref 26.0–34.0)
MCHC: 34.1 g/dL (ref 30.0–36.0)
MCV: 92.4 fL (ref 78.0–100.0)
Monocytes Absolute: 0.7 K/uL (ref 0.1–1.0)
Monocytes Relative: 6 % (ref 3–12)
Neutro Abs: 9.6 10*3/uL — ABNORMAL HIGH (ref 1.7–7.7)
Neutrophils Relative %: 81 % — ABNORMAL HIGH (ref 43–77)
Platelets: 221 10*3/uL (ref 150–400)
RBC: 3.81 MIL/uL — ABNORMAL LOW (ref 3.87–5.11)
RDW: 12.1 % (ref 11.5–15.5)
WBC: 11.8 K/uL — ABNORMAL HIGH (ref 4.0–10.5)

## 2012-09-14 LAB — URINALYSIS, ROUTINE W REFLEX MICROSCOPIC
Bilirubin Urine: NEGATIVE
Glucose, UA: NEGATIVE mg/dL
Hgb urine dipstick: NEGATIVE
Ketones, ur: 15 mg/dL — AB
Leukocytes, UA: NEGATIVE
Nitrite: NEGATIVE
Protein, ur: NEGATIVE mg/dL
Specific Gravity, Urine: 1.016 (ref 1.005–1.030)
Urobilinogen, UA: 0.2 mg/dL (ref 0.0–1.0)
pH: 6 (ref 5.0–8.0)

## 2012-09-14 LAB — BASIC METABOLIC PANEL WITH GFR
BUN: 18 mg/dL (ref 6–23)
CO2: 23 meq/L (ref 19–32)
Calcium: 9.2 mg/dL (ref 8.4–10.5)
Creatinine, Ser: 0.8 mg/dL (ref 0.50–1.10)
Glucose, Bld: 125 mg/dL — ABNORMAL HIGH (ref 70–99)

## 2012-09-14 LAB — BASIC METABOLIC PANEL
Chloride: 100 mEq/L (ref 96–112)
GFR calc Af Amer: 79 mL/min — ABNORMAL LOW (ref >90)
GFR calc non Af Amer: 68 mL/min — ABNORMAL LOW (ref >90)
Potassium: 3.9 mEq/L (ref 3.5–5.1)
Sodium: 137 mEq/L (ref 135–145)

## 2012-09-14 LAB — SEDIMENTATION RATE: Sed Rate: 40 mm/hr — ABNORMAL HIGH (ref 0–22)

## 2012-09-14 MED ORDER — PROMETHAZINE HCL 25 MG PO TABS: 25.0000 mg | ORAL_TABLET | Freq: Four times a day (QID) | ORAL | Status: DC | PRN

## 2012-09-14 MED ORDER — PREDNISONE 20 MG PO TABS: 40.0000 mg | ORAL_TABLET | Freq: Every day | ORAL | Status: DC

## 2012-09-14 MED ORDER — LORAZEPAM 2 MG/ML IJ SOLN
0.5000 mg | Freq: Once | INTRAMUSCULAR | Status: AC
  Administered 2012-09-14: 0.5 mg via INTRAVENOUS
  Filled 2012-09-14: qty 1

## 2012-09-14 MED ORDER — OXYCODONE-ACETAMINOPHEN 5-325 MG PO TABS: ORAL_TABLET | ORAL | Status: DC

## 2012-09-14 MED ORDER — DIPHENHYDRAMINE HCL 50 MG/ML IJ SOLN
25.0000 mg | Freq: Once | INTRAMUSCULAR | Status: AC
  Administered 2012-09-14: 25 mg via INTRAVENOUS
  Filled 2012-09-14: qty 1

## 2012-09-14 MED ORDER — MORPHINE SULFATE 4 MG/ML IJ SOLN
4.0000 mg | Freq: Once | INTRAMUSCULAR | Status: AC
  Administered 2012-09-14: 4 mg via INTRAVENOUS
  Filled 2012-09-14: qty 1

## 2012-09-14 MED ORDER — OXYCODONE-ACETAMINOPHEN 5-325 MG PO TABS
1.0000 | ORAL_TABLET | Freq: Once | ORAL | Status: AC
  Administered 2012-09-14: 1 via ORAL
  Filled 2012-09-14 (×2): qty 1

## 2012-09-14 MED ORDER — PROMETHAZINE HCL 25 MG/ML IJ SOLN
25.0000 mg | Freq: Once | INTRAMUSCULAR | Status: AC
  Administered 2012-09-14: 25 mg via INTRAVENOUS
  Filled 2012-09-14: qty 1

## 2012-09-14 NOTE — ED Notes (Signed)
Patient is alert and oriented X 3.  Patient's husband at the bedside.  Patient states that pain has not improved since the last medication administration.  MD aware, new orders given.

## 2012-09-14 NOTE — ED Notes (Signed)
Pt states she has a hx of migraines and was seen here last night for same. Also saw her Dr. And was told she was taking too much Imitrex and needed to stop. +nausea.

## 2012-09-14 NOTE — Discharge Instructions (Signed)
General Headache Without Cause A headache is pain or discomfort felt around the head or neck area. The specific cause of a headache may not be found. There are many causes and types of headaches. A few common ones are:  Tension headaches.  Migraine headaches.  Cluster headaches.  Chronic daily headaches. HOME CARE INSTRUCTIONS   Keep all follow-up appointments with your caregiver or any specialist referral.  Only take over-the-counter or prescription medicines for pain or discomfort as directed by your caregiver.  Lie down in a dark, quiet room when you have a headache.  Keep a headache journal to find out what may trigger your migraine headaches. For example, write down:  What you eat and drink.  How much sleep you get.  Any change to your diet or medicines.  Try massage or other relaxation techniques.  Put ice packs or heat on the head and neck. Use these 3 to 4 times per day for 15 to 20 minutes each time, or as needed.  Limit stress.  Sit up straight, and do not tense your muscles.  Quit smoking if you smoke.  Limit alcohol use.  Decrease the amount of caffeine you drink, or stop drinking caffeine.  Eat and sleep on a regular schedule.  Get 7 to 9 hours of sleep, or as recommended by your caregiver.  Keep lights dim if bright lights bother you and make your headaches worse. SEEK MEDICAL CARE IF:   You have problems with the medicines you were prescribed.  Your medicines are not working.  You have a change from the usual headache.  You have nausea or vomiting. SEEK IMMEDIATE MEDICAL CARE IF:   Your headache becomes severe.  You have a fever.  You have a stiff neck.  You have loss of vision.  You have muscular weakness or loss of muscle control.  You start losing your balance or have trouble walking.  You feel faint or pass out.  You have severe symptoms that are different from your first symptoms. MAKE SURE YOU:   Understand these  instructions.  Will watch your condition.  Will get help right away if you are not doing well or get worse. Document Released: 08/31/2005 Document Revised: 11/23/2011 Document Reviewed: 09/16/2011 [employer] Patient Information 2013 [employer].  

## 2012-09-14 NOTE — ED Provider Notes (Signed)
History     CSN: [id number]  Arrival date & time 09/14/12  1319   First MD Initiated Contact with Patient 09/14/12 1505      Chief Complaint  Patient presents with  . Migraine    (Consider location/radiation/quality/duration/timing/severity/associated sxs/prior treatment) HPI Comments: Patient with long-standing history of migraines since she was in her 15s, usually improved with Imitrex. She has had a persistent migraine for nearly a week primarily over the left temple but has had pain both on the left and right and across her forehead. Symptoms are very similar to prior migraines. She reports usually Imitrex ulcer headaches and she never gets into the face of having photophobia and vomiting. This particular headache has caused her to vomit today. She was seen last night and treated with a headache cocktail which did not seem to help much, she received 2 doses of IV morphine which did improve her headache. She also had a set CT scan which was interpreted as showing no acute or significantly abnormal findings. She reports she did go home and slept about 5 hours, but has woken up and headache seems to have come back and is almost just as severe as it was before. She denies any focal numbness or weakness. She has gone through nearly an entire box of her Imitrex, has not used another one since yesterday morning and has been told that she should not use excessive amounts Imitrex. She denies fever, rash, stiff neck. She denies any trauma. She denies any change in her vision. She does endorse slight photophobia. Family has not noticed a change in voice, slurred speech, confusion. Patient reports pain at left temple seems slightly worse when she rubs on her temple area.  Patient is a 79 y.o. female presenting with migraines. The history is provided by the patient.  Migraine Associated symptoms include headaches. Pertinent negatives include no chest pain, no abdominal pain and no shortness of breath.     Past Medical History  Diagnosis Date  . Hypertension   . Thrombosed hemorrhoids   . Cataracts, bilateral   . Migraine   . IBS (irritable bowel syndrome)     Past Surgical History  Procedure Date  . Eye surgery 1999    bilateral cataract removal  . Colon surgery 2001    colon resection    Family History  Problem Relation Age of Onset  . Alzheimer's disease Mother   . Heart disease Father     History  Substance Use Topics  . Smoking status: Never Smoker   . Smokeless tobacco: Never Used  . Alcohol Use: Yes     Comment: occasional glass of wine    OB History    Grav Para Term Preterm Abortions TAB SAB Ect Mult Living                  Review of Systems  Constitutional: Negative for fever and chills.  HENT: Negative for congestion, rhinorrhea, neck pain and neck stiffness.   Eyes: Positive for photophobia. Negative for visual disturbance.  Respiratory: Negative for shortness of breath.   Cardiovascular: Negative for chest pain.  Gastrointestinal: Positive for nausea and vomiting. Negative for abdominal pain and diarrhea.  Neurological: Positive for headaches. Negative for syncope, weakness and numbness.  All other systems reviewed and are negative.    Allergies  Review of patient's allergies indicates no known allergies.  Home Medications   Current Outpatient Rx  Name  Route  Sig  Dispense  Refill  . ACETAMINOPHEN  500 MG PO TABS   Oral   Take 500 mg by mouth as needed.         Marland Kitchen CALTRATE 600+D PO   Oral   Take by mouth 2 (two) times daily.         Marland Kitchen EVISTA 60 MG PO TABS      Daily.         Marland Kitchen PROCTOZONE H RE   Rectal   Place rectally as needed.         . INDAPAMIDE 2.5 MG PO TABS      Daily.         . IMODIUM PO   Oral   Take by mouth as needed.         Marland Kitchen LORAZEPAM 0.5 MG PO TABS      Daily.         Marland Kitchen MIRTAZAPINE 45 MG PO TABS      Daily.         . CENTRUM SILVER PO   Oral   Take by mouth daily.         .  OXYCODONE-ACETAMINOPHEN 5-325 MG PO TABS   Oral   Take 2 tablets by mouth every 4 (four) hours as needed for pain.   6 tablet   0   . POLYETHYLENE GLYCOL 3350 PO POWD      Daily.         Marland Kitchen PREDNISONE 20 MG PO TABS   Oral   Take 2 tablets (40 mg total) by mouth daily.   14 tablet   0   . PSYLLIUM 58.6 % PO PACK   Oral   Take 1 packet by mouth daily.         . QUINAPRIL HCL 40 MG PO TABS      Daily.         . SUMATRIPTAN SUCCINATE 25 MG PO TABS   Oral   Take 25 mg by mouth as needed.         Marland Kitchen ZOLPIDEM TARTRATE 10 MG PO TABS      as needed.            BP 136/68  Pulse 90  Temp 99 F (37.2 C) (Oral)  Resp 18  Ht 5\' 3"  (1.6 m)  Wt 110 lb (49.896 kg)  BMI 19.49 kg/m2  SpO2 100%  Physical Exam  Nursing note and vitals reviewed. Constitutional: She is oriented to person, place, and time. She appears well-developed and well-nourished.  HENT:  Head: Normocephalic and atraumatic.  Eyes: EOM are normal. Pupils are equal, round, and reactive to light.  Neck: Normal range of motion. Neck supple.  Cardiovascular: Normal rate and intact distal pulses.   No murmur heard. Pulmonary/Chest: Effort normal. No respiratory distress. She has no wheezes.  Abdominal: Soft. She exhibits no distension. There is no tenderness. There is no rebound.  Neurological: She is alert and oriented to person, place, and time. No cranial nerve deficit. She exhibits normal muscle tone. Coordination normal.  Skin: Skin is warm and dry. No rash noted.  Psychiatric: She has a normal mood and affect.    ED Course  Procedures (including critical care time)  Labs Reviewed  CBC WITH DIFFERENTIAL - Abnormal; Notable for the following:    WBC 11.8 (*)     RBC 3.81 (*)     HCT 35.2 (*)     Neutrophils Relative 81 (*)     Neutro Abs 9.6 (*)  All other components within normal limits  BASIC METABOLIC PANEL - Abnormal; Notable for the following:    Glucose, Bld 125 (*)     GFR calc  non Af Amer 68 (*)     GFR calc Af Amer 79 (*)     All other components within normal limits  SEDIMENTATION RATE - Abnormal; Notable for the following:    Sed Rate 40 (*)     All other components within normal limits  URINALYSIS, ROUTINE W REFLEX MICROSCOPIC   Ct Head Wo Contrast  09/13/2012  *RADIOLOGY REPORT*  Clinical Data: Headache  CT HEAD WITHOUT CONTRAST  Technique:  Contiguous axial images were obtained from the base of the skull through the vertex without contrast.  Comparison: None.  Findings: There is no evidence for acute hemorrhage, hydrocephalus, mass lesion, or abnormal extra-axial fluid collection.  No definite CT evidence for acute infarction.  Diffuse loss of parenchymal volume is consistent with atrophy. Patchy low attenuation in the deep hemispheric and periventricular white matter is nonspecific, but likely reflects chronic microvascular ischemic demyelination. The visualized paranasal sinuses and mastoid air cells are clear.  IMPRESSION: No acute intracranial abnormality.  Atrophy with chronic small vessel white matter ischemic demyelination.   Original Report Authenticated By: Kennith Center, M.D.      1. Headache     5:51 PM Sed rate is marginally elevated given her age factored in.  Will put her on prednisone and refer her back to Dr. Wylene Simmer for follow up, and consideration for temporal arteritis.    MDM   I reviewed patient's ED note from yesterday. Since patient didn't have much relief with typical migraine cocktail, I gave patient IV Phenergan as well as Benadryl. Patient noted very minimal headache improvement and now she reports headache seems to be getting worse. I've explained to the patient and family my concerns for the possibility of narcotic induced rebound headache. Given she is feeling worse, I have resorted to giving her morphine again which did improve her headache yesterday. I reviewed that she did indeed have a negative noncontrast head CT yesterday.  Given the location, I will also send a sedimentation rate as well as obtain basic blood tests and urinalysis. I anticipate that these will be normal however.        Gavin Pound. Suhaib Guzzo, MD 09/14/12 1757

## 2012-12-22 ENCOUNTER — Ambulatory Visit (INDEPENDENT_AMBULATORY_CARE_PROVIDER_SITE_OTHER): Payer: Medicare Other | Admitting: General Surgery

## 2012-12-22 ENCOUNTER — Encounter (INDEPENDENT_AMBULATORY_CARE_PROVIDER_SITE_OTHER): Payer: Self-pay | Admitting: General Surgery

## 2012-12-22 VITALS — BP 118/70 | HR 72 | Temp 98.2°F | Resp 16 | Ht 63.0 in | Wt 110.0 lb

## 2012-12-22 DIAGNOSIS — R159 Full incontinence of feces: Secondary | ICD-10-CM

## 2012-12-22 NOTE — Progress Notes (Signed)
Chief Complaint  Patient presents with  . Encopresis    HISTORY: Christina Escobar is a 79 y.o. female who presents to the office with fecal incontinence.  Other symptoms include inability to determine air vs stool and multiple BM's a day.  This had been occurring for ~2 yrs and getting worse.  She has tried metamucil and miralax in the past with no success.  Spicy foods makes the symptoms worse. She denies any leakage.  She has several episodes a day of urgency.  She has an episode of incontinence once every other week or so.  She denies any incontinence to flatus.  It is intermittent in nature.   Her fiber intake is good.  her last colonoscopy was march 2014. She has had 2 normal vaginal deliveries.  Past Medical History  Diagnosis Date  . Hypertension   . Thrombosed hemorrhoids   . Cataracts, bilateral   . Migraine   . IBS (irritable bowel syndrome)       Past Surgical History  Procedure Laterality Date  . Eye surgery  1999    bilateral cataract removal  . Colon surgery  2001    colon resection  . Incision and drainage  11/05/2011    thrombosed hemorrhoid        Current Outpatient Prescriptions  Medication Sig Dispense Refill  . acetaminophen (TYLENOL) 500 MG tablet Take 500 mg by mouth as needed.      . Calcium Carbonate-Vitamin D (CALTRATE 600+D PO) Take by mouth 2 (two) times daily.      Marland Kitchen EVISTA 60 MG tablet Daily.      . indapamide (LOZOL) 2.5 MG tablet Daily.      . Loperamide HCl (IMODIUM PO) Take by mouth as needed.      Marland Kitchen LORazepam (ATIVAN) 0.5 MG tablet Daily.      . mirtazapine (REMERON) 45 MG tablet Daily.      . Multiple Vitamins-Minerals (CENTRUM SILVER PO) Take by mouth daily.      . polyethylene glycol powder (GLYCOLAX/MIRALAX) powder Daily.      . psyllium (METAMUCIL) 58.6 % packet Take 1 packet by mouth daily.      . quinapril (ACCUPRIL) 40 MG tablet Daily.      . SUMAtriptan (IMITREX) 25 MG tablet Take 25 mg by mouth as needed.      . zolpidem (AMBIEN) 10  MG tablet as needed.        No current facility-administered medications for this visit.      No Known Allergies    Family History  Problem Relation Age of Onset  . Alzheimer's disease Mother   . Heart disease Father     History   Social History  . Marital Status: Married    Spouse Name: N/A    Number of Children: N/A  . Years of Education: N/A   Social History Main Topics  . Smoking status: Never Smoker   . Smokeless tobacco: Never Used  . Alcohol Use: Yes     Comment: occasional glass of wine  . Drug Use: No  . Sexually Active: None   Other Topics Concern  . None   Social History Narrative  . None      REVIEW OF SYSTEMS - PERTINENT POSITIVES ONLY: Review of Systems - General ROS: negative for - chills, fever or weight loss Hematological and Lymphatic ROS: negative for - bleeding problems, blood clots or bruising Respiratory ROS: no cough, shortness of breath, or wheezing Cardiovascular ROS: no chest  pain or dyspnea on exertion Gastrointestinal ROS: no abdominal pain, change in bowel habits, or black or bloody stools Genito-Urinary ROS: no dysuria, trouble voiding, or hematuria  EXAM: Filed Vitals:   12/22/12 1530  BP: 118/70  Pulse: 72  Temp: 98.2 F (36.8 C)  Resp: 16    General appearance: alert and cooperative Resp: clear to auscultation bilaterally Cardio: regular rate and rhythm GI: soft, non-tender; bowel sounds normal; no masses,  no organomegaly   Procedure: Anoscopy Surgeon: Christina Fus Diagnosis: fecal incontinence  Assistant: Christina Escobar After the risks and benefits were explained, verbal consent was obtained for above procedure  Anesthesia: none Findings: Minimal hemorrhoidal disease, good resting tone, weak squeeze (gluteal compensation), good push, normal appearing mucosa, no palpable sphincter defects    ASSESSMENT AND PLAN: Christina Escobar is a pleasant 79 y.o. F with fecal urgency and occasional fecal incontinence.  This has been  gradually worsening.  On exam, her entire ext anal sphincter appears to not contract.  I do not appreciate any sphincter defects at this time.  I presented the options of continued medical management with increasing her fiber intake, biofeedback therapy, rectal [place] to evaluate for need for sphincter repair, Solesta injection, and sacral nerve stimulator.  I think that Solesta injections and biofeedback would be reasonable things to consider in her situation.  She would like to think about this.  She will call the office when she has made a decision.    Vanita Panda, MD Colon and Rectal Surgery / General Surgery [facility]      Visit Diagnoses: 1. Fecal incontinence     Primary Care Physician: Gaspar Garbe, MD

## 2012-12-22 NOTE — Patient Instructions (Signed)
Bowel Incontinence  What is incontinence? Incontinence is the impaired ability to control gas or stool. Its severity ranges from mild difficulty with gas control to severe loss of control over liquid and formed stools. Incontinence to stool is a common problem, but often it is not discussed due to embarassment. What causes incontinence? There are many causes of incontinence. Injury during childbirth is one of the most common causes. These injuries may cause a tear in the anal muscles. The nerves supplying the anal muscles may also be injured. While some injuries may be recognized immediately following childbirth, many others may go unnoticed and not become a problem until later in life. In these situations, a prior childbirth may not be recognized as the cause of incontinence. Anal operations or traumatic injury to the tissue surrounding the anal region similarly can damage the anal muscles and hinder bowel control. Some individuals experience loss of strength in the anal muscles as they age. As a result, a minor control problem in a younger person may become more significant later in life. Diarrhea may be associated with a feeling of urgency or stool leakage due to the frequent liquid stools passing through the anal opening. If bleeding accompanies your bowel movements or you have lack of bowel control, consult your physician. These symptoms may indicate inflammation within the colon (colitis), a rectal tumor or rectal prolapse - all conditions that require prompt evaluation by a physician.    How is the cause of incontinence determined? An initial discussion of the problem with your physician will help establish the degree of control difficulty and its impact on your lifestyle. Many clues to the origin of incontinence may be found in patient histories. For example, a woman's history of past childbirths is very important. Multiple pregnancies, large weight babies, forceps deliveries, or episiotomies  may contribute to muscle or nerve injury at the time of childbirth. In some cases, medical illnesses and medications play a role in problems with control. A physical exam of the anal region should be performed. It may readily identify an obvious injury to the anal muscles. In addition, an ultrasound probe can be used within the anal area to provide a picture of the muscles and show areas in which the anal muscles have been injured. Frequently, additional studies are required to define the anal area more completely. In a test called anal manometry, a small catheter is placed into the anus to record pressure as patients relax and tighten the anal muscles. This test can demonstrate how weak or strong the muscle really is. A separate test may also be conducted to determine if the nerves that go to the anal muscles are functioning properly. What can be done to correct the problem? Treatment of incontinence may include: .     Dietary changes .     Constipating medications .     Muscle strengthening exercises .     Biofeedback  Injectable bulking agents  Surgical muscle repair  Artificial anal sphincter  Sacral nerve stimulator  After a careful history, physical examination and testing to determine the cause and severity of the problem, treatment can be addressed. Mild problems may be treated very simply with dietary changes and the use of some constipating medications. Diseases which cause inflammation in the rectum, such as colitis, may contribute to anal control problems. Treating these diseases also may eliminate or improve symptoms of incontinence. Sometimes a change in prescribed medications may help. Your physician also may recommend simple home exercises that may   strengthen the anal muscles to help in mild cases. A type of physical therapy called biofeedback can be used to help patients sense when stool is ready to be evacuated and help strengthen the muscles. Injuries to the anal muscles may be  repaired with surgery. Some individuals may benefit from a technique that delivers electrical energy to the skin and muscles surrounding the anus which results in firming and thickening of this area to help with continence. In certain individuals that have nerve damage or anal muscles that are damaged beyond repair, an artificial sphincter may be implanted. The artificial sphincter is a plastic, fluid filled doughnut that is surgically implanted around the damaged anal sphincter. This artificial sphincter keeps the anal canal closed. When an individual wants to have a bowel movement, the fluid can be pumped out of the doughnut to allow the anal canal to open. In extreme cases, patients may find that a colostomy is the best option for improving their quality of life. What is a colon and rectal surgeon? Colon and rectal surgeons are experts in the surgical and non-surgical treatment of diseases of the colon, rectum and anus. They have completed advanced surgical training in the treatment of these diseases as well as full general surgical training. Board-certified colon and rectal surgeons complete residencies in general surgery and colon and rectal surgery, and pass intensive examinations conducted by the American Board of Surgery and the American Board of Colon and Rectal Surgery. They are well-versed in the treatment of both benign and malignant diseases of the colon, rectum and anus and are able to perform routine screening examinations and surgically treat conditions if indicated to do so.  2012 American Society of Colon & Rectal Surgeons    

## 2012-12-29 ENCOUNTER — Encounter (INDEPENDENT_AMBULATORY_CARE_PROVIDER_SITE_OTHER): Payer: Self-pay

## 2013-02-22 ENCOUNTER — Ambulatory Visit (INDEPENDENT_AMBULATORY_CARE_PROVIDER_SITE_OTHER): Payer: Medicare Other | Admitting: Neurology

## 2013-02-22 ENCOUNTER — Telehealth: Payer: Self-pay | Admitting: Neurology

## 2013-02-22 VITALS — BP 112/66 | HR 72 | Temp 98.2°F

## 2013-02-22 DIAGNOSIS — G43909 Migraine, unspecified, not intractable, without status migrainosus: Secondary | ICD-10-CM

## 2013-02-22 MED ORDER — VALPROATE SODIUM 500 MG/5ML IV SOLN
1000.0000 mg | INTRAVENOUS | Status: DC
  Administered 2013-02-22: 1000 mg via INTRAVENOUS

## 2013-02-22 NOTE — Telephone Encounter (Signed)
I spoke to [facility], and he advised patient come in for a Depacon infusion.  I called and relayed this message to patient and she is coming in today for infusion.

## 2013-02-22 NOTE — Progress Notes (Signed)
Patient here for Depacon 1000mg  IV.   Patient to treatment room.  Under aseptic technique IV started in left AC, 22 g angiocath, good blood return.  Patient states her migraine 9 last night, today a little less.   Infusion started at 1245, upon completion patient states her pain has traveled to the back of her head, no change in pain level.  She wanted to know what to do to prevent them from coming on, imitrex is not working.  I told her I would have the doctor call her later today, and I will get her an appointment next week before she goes on vacation.

## 2013-02-28 ENCOUNTER — Encounter: Payer: Self-pay | Admitting: Nurse Practitioner

## 2013-02-28 ENCOUNTER — Ambulatory Visit (INDEPENDENT_AMBULATORY_CARE_PROVIDER_SITE_OTHER): Payer: Medicare Other | Admitting: Nurse Practitioner

## 2013-02-28 VITALS — BP 131/80 | HR 89 | Temp 97.9°F | Ht 63.0 in | Wt 106.0 lb

## 2013-02-28 DIAGNOSIS — G43909 Migraine, unspecified, not intractable, without status migrainosus: Secondary | ICD-10-CM | POA: Insufficient documentation

## 2013-02-28 NOTE — Patient Instructions (Addendum)
Continue current medications as advised.  Follow up with Dr. Frances Furbish 2 months.

## 2013-02-28 NOTE — Progress Notes (Signed)
[facility]  PATIENT: Christina Escobar DOB: 13-Feb-1930   HISTORY FROM: patient REASON FOR VISIT: follow up   HISTORICAL  CHIEF COMPLAINT:  Chief Complaint  Patient presents with  . Follow-up    migraines    HISTORY OF PRESENT ILLNESS:  79 year old pleasant right-handed Caucasian female with long-standing history of migraine headaches. She states that she started having migraine headaches when she was about 79 years old. At that time for the most part her migraines were controlled with Cafergot and then went to the Imitrex when it came on the market and she felt it was lifesaver for her. For years she was well controlled without medication. She used to see Dr. Anne Hahn and his nurse practitioner here in our office but did not have to come for the past 2 years because her migraines were well controlled with use of Imitrex as needed. However recently after having been on a trip to the [facility] she came back with headaches and found herself taking Imitrex 3 days in a row. Then on New Year's Eve she finally had to go to the emergency room and was given IV fluids and oxycodone as well as a prescription for prednisone. According to records the emergency room doctor checked her ESR and it was 40. The sedimentation rate would speak against the existence of giant cell arteritis and the patient ended up not taking the prednisone. However she did not improve in the next day she had a go back to the emergency room. The prednisone she got in the emergency room helped and she was given 40 mg daily to take for a week. She has 3 pills left. She took 20 mg this morning. Her headaches have resolved after the second ER visit. She had a left-sided headache before along with nausea and vomiting and photophobia as well as sonophobia. Her typical migraine is bilateral or bitemporal generalized and associated with photophobia but not often with nausea.  She rarely has had any headaches  thankfully and the only times she has had headaches were triggered by sleep deprivation or changes in her day-to-day schedule or traveling with jet lag. She is mostly worried about why she had an unrelenting headache that did not resolve and prompted her to go the emergency room 2 days in a row. Currently she feels back to baseline and is in good spirits. She reports no other changes other than recent traveling with air travel and possible dehydration as well as jet lag.  UPDATE: 02/28/13: Patient comes in for followup today after coming in office on 02/22/2013 acutely for an infusion of Depacon 1000 mg IV for severe migraine.  She states that the Depacon infusion dulled the headache a little but she do not see much benefit. She is not having headache today and has not had headache since that day. She states that her headaches started coming back after a long absence close to the beginning of this year in which she started having severe problems with IBS and fecal incontinence.  In the past for migraines usually were brought on by vacations but did not occur until after getting home from vacation.  She feels that the increased stress that she has felt since the beginning of her problems with IBS and incontinence has brought back the migraines.  She has questions about how often she is to take her Imitrex and oxycodone.  She and her husband plan on leaving for vacation on Saturday. She has never taken a preventative  medicine for migraine because they are never more frequent than once a month.  REVIEW OF SYSTEMS: Full 14 system review of systems performed and notable only for ringing in ears, bowel incontinence, and allergies, anxiety, not enough sleep, decreased energy.  ALLERGIES: Allergies  Allergen Reactions  . Flagyl (Metronidazole)     HOME MEDICATIONS: Outpatient Prescriptions Prior to Visit  Medication Sig Dispense Refill  . acetaminophen (TYLENOL) 500 MG tablet Take 500 mg by mouth as needed.       . Calcium Carbonate-Vitamin D (CALTRATE 600+D PO) Take by mouth 2 (two) times daily.      . indapamide (LOZOL) 2.5 MG tablet Daily.      . Loperamide HCl (IMODIUM PO) Take by mouth as needed.      Marland Kitchen LORazepam (ATIVAN) 0.5 MG tablet Daily.      . mirtazapine (REMERON) 45 MG tablet Daily.      . Multiple Vitamins-Minerals (CENTRUM SILVER PO) Take by mouth daily.      . polyethylene glycol powder (GLYCOLAX/MIRALAX) powder Daily.      . psyllium (METAMUCIL) 58.6 % packet Take 1 packet by mouth daily.      . quinapril (ACCUPRIL) 40 MG tablet Daily.      . SUMAtriptan (IMITREX) 25 MG tablet Take 25 mg by mouth as needed.      . zolpidem (AMBIEN) 10 MG tablet as needed.       Marland Kitchen EVISTA 60 MG tablet Daily.       Facility-Administered Medications Prior to Visit  Medication Dose Route Frequency Provider Last Rate Last Dose  . valproate (DEPACON) 1,000 mg in sodium chloride 0.9 % 100 mL IVPB  1,000 mg Intravenous Continuous Suanne Marker, MD   1,000 mg at 02/22/13 1253   . EVISTA 60 MG tablet    Sig: Take 1 tablet by mouth daily.  . Oxycodone HCl 10 MG TABS    Sig: Take by mouth.  . promethazine (PHENERGAN) 12.5 MG tablet    Sig: Take 12.5 mg by mouth every 6 (six) hours as needed for nausea.     PAST MEDICAL HISTORY: Past Medical History  Diagnosis Date  . Hypertension   . Thrombosed hemorrhoids   . Cataracts, bilateral   . Migraine   . IBS (irritable bowel syndrome)   . Basal cell carcinoma   . Anxiety   . Depression   . Osteopenia   . Diverticulosis     hx of stricture in colon  . Hemorrhoids   . Insomnia     PAST SURGICAL HISTORY: Past Surgical History  Procedure Laterality Date  . Eye surgery  1999    bilateral cataract removal  . Colon surgery  2001    colon resection  . Incision and drainage  11/05/2011    thrombosed hemorrhoid  . Transanal excision of rectal mass with hemorrhoid injection  2007  . Basal cell carcinoma excision  2007,2008    FAMILY  HISTORY: Family History  Problem Relation Age of Onset  . Alzheimer's disease Mother   . Heart disease Father     SOCIAL HISTORY: History   Social History  . Marital Status: Married    Spouse Name: Solicitor    Number of Children: 2  . Years of Education: MA   Occupational History  . Not on file.   Social History Main Topics  . Smoking status: Never Smoker   . Smokeless tobacco: Never Used  . Alcohol Use: Yes     Comment: occasional  glass of wine   . Drug Use: No  . Sexually Active: Not on file   Other Topics Concern  . Not on file   Social History Narrative   Pt lives at home with her Spouse. She has 3 children, (1 adopted). Spouse is a retired Development worker, international aid.   Caffeine Use: 1-2 cups daily.     PHYSICAL EXAM  Filed Vitals:   02/28/13 1000  BP: 131/80  Pulse: 89  Temp: 97.9 F (36.6 C)  TempSrc: Oral  Height: 5\' 3"  (1.6 m)  Weight: 106 lb (48.081 kg)   Body mass index is 18.78 kg/(m^2).  GENERAL EXAM: Patient is in no distress, pleasant Caucasian female, well-developed.  CARDIOVASCULAR: Regular rate and rhythm, no murmurs, no carotid bruits  NEUROLOGIC: MENTAL STATUS: awake, alert, language fluent, comprehension intact, naming intact CRANIAL NERVE: no papilledema on fundoscopic exam, pupils equal and reactive to light, visual fields full to confrontation, extraocular muscles intact, no nystagmus, facial sensation and strength symmetric, uvula midline, shoulder shrug symmetric, tongue midline. MOTOR: normal bulk and tone, full strength in the BUE, BLE SENSORY: normal and symmetric to light touch, pinprick, temperature, vibration and proprioception COORDINATION: finger-nose-finger, fine finger movements normal REFLEXES: deep tendon reflexes present and symmetric GAIT/STATION: narrow based gait; able to walk on toes, heels and tandem; romberg is negative   DIAGNOSTIC DATA (LABS, IMAGING, TESTING) - I reviewed patient records, labs, notes, testing and  imaging myself where available.  Lab Results  Component Value Date   WBC 11.8* 09/14/2012   HGB 12.0 09/14/2012   HCT 35.2* 09/14/2012   MCV 92.4 09/14/2012   PLT 221 09/14/2012      Component Value Date/Time   NA 137 09/14/2012 1547   K 3.9 09/14/2012 1547   CL 100 09/14/2012 1547   CO2 23 09/14/2012 1547   GLUCOSE 125* 09/14/2012 1547   BUN 18 09/14/2012 1547   CREATININE 0.80 09/14/2012 1547   CALCIUM 9.2 09/14/2012 1547   GFRNONAA 68* 09/14/2012 1547   GFRAA 79* 09/14/2012 1547     ASSESSMENT AND PLAN  79 y.o. year old female  has a past medical history of Hypertension; Thrombosed hemorrhoids; Cataracts, bilateral; Migraine; IBS (irritable bowel syndrome); Basal cell carcinoma; Anxiety; Depression; Osteopenia; Diverticulosis; Hemorrhoids; and Insomnia. here with migraines. Counseled patient on use of Imitrex for acute migraine, no more than 2 pills in a 24-hour period but she may take them on consecutive days if needed. Advised patient to take oxycodone only if needed and if Imitrex has not taken her headache away.  Recommended that she keep herself well hydrated while traveling and keep a good sleep-wake schedule.  Do not feel that migraine prophylaxis is warranted because headaches are still once a month or less.  Follow up with Dr. Frances Furbish 2 months.  Makarios Madlock NP-C 02/28/2013, 11:10 AM  [facility] [address] [place], [place] [address] [phone]

## 2013-03-03 NOTE — Progress Notes (Signed)
I reviewed note and agree with plan.   Trinda Harlacher R. Dreana Britz, MD 03/03/2013, 12:08 AM Certified in Neurology, Neurophysiology and Neuroimaging  [facility] [address], [address] [place], [place] [address] [phone]  

## 2013-04-10 ENCOUNTER — Other Ambulatory Visit: Payer: Self-pay

## 2013-04-10 DIAGNOSIS — Z1231 Encounter for screening mammogram for malignant neoplasm of breast: Secondary | ICD-10-CM

## 2013-05-01 ENCOUNTER — Ambulatory Visit: Payer: Medicare Other

## 2013-05-02 ENCOUNTER — Ambulatory Visit
Admission: RE | Admit: 2013-05-02 | Discharge: 2013-05-02 | Disposition: A | Discharge status: Home / Self Care | Payer: Medicare Other | Source: Ambulatory Visit

## 2013-05-02 DIAGNOSIS — Z1231 Encounter for screening mammogram for malignant neoplasm of breast: Secondary | ICD-10-CM

## 2013-05-22 ENCOUNTER — Encounter: Payer: Self-pay | Admitting: Neurology

## 2013-05-22 ENCOUNTER — Ambulatory Visit (INDEPENDENT_AMBULATORY_CARE_PROVIDER_SITE_OTHER): Payer: Medicare Other | Admitting: Neurology

## 2013-05-22 VITALS — BP 131/67 | HR 70 | Temp 97.9°F | Ht 63.0 in | Wt 108.0 lb

## 2013-05-22 DIAGNOSIS — G43909 Migraine, unspecified, not intractable, without status migrainosus: Secondary | ICD-10-CM

## 2013-05-22 MED ORDER — PREDNISONE 20 MG PO TABS: 20.0000 mg | ORAL_TABLET | Freq: Every day | ORAL | Status: DC

## 2013-05-22 NOTE — Progress Notes (Signed)
Subjective:    Patient ID: Christina Escobar is a 79 y.o. female.  HPI  Interim history:   Christina Escobar is a very pleasant 79 year old right-handed woman who presents for followup consultation of her migraine headaches. She is unaccompanied today. I first met her on 09/21/2012, at which time she gave a long-standing history of migraine headaches since age 63 approximately. In the past she used Cafergot and Imitrex. She had seen Dr. Anne Hahn in our office in the past. She had flareup of her migraines in December of last year and was treated in the emergency room with IV fluids and oxycodone as well as prednisone. In the interim on 02/22/2013 she came in for Depacon infusion 1000 milligrams, which was marginally helpful. She was then seen by our nurse practitioner on 02/28/2013 and followup and reported feeling better. She has an underlying medical history basal cell cancer, anxiety, depression, osteopenia, irritable bowel syndrome, hemorrhoids, insomnia and diverticulosis. She feels, that cutting back on her Miralax has helped her migraines. She is also going to cut back on her traveling by plane.   Her Past Medical History Is Significant For: Past Medical History  Diagnosis Date  . Hypertension   . Thrombosed hemorrhoids   . Cataracts, bilateral   . Migraine   . IBS (irritable bowel syndrome)   . Basal cell carcinoma   . Anxiety   . Depression   . Osteopenia   . Diverticulosis     hx of stricture in colon  . Hemorrhoids   . Insomnia     Her Past Surgical History Is Significant For: Past Surgical History  Procedure Laterality Date  . Eye surgery  1999    bilateral cataract removal  . Colon surgery  2001    colon resection  . Incision and drainage  11/05/2011    thrombosed hemorrhoid  . Transanal excision of rectal mass with hemorrhoid injection  2007  . Basal cell carcinoma excision  2007,2008    Her Family History Is Significant For: Family History  Problem Relation Age of  Onset  . Alzheimer's disease Mother   . Heart disease Father     Her Social History Is Significant For: History   Social History  . Marital Status: Married    Spouse Name: Solicitor    Number of Children: 2  . Years of Education: MA   Social History Main Topics  . Smoking status: Never Smoker   . Smokeless tobacco: Never Used  . Alcohol Use: Yes     Comment: occasional glass of wine   . Drug Use: No  . Sexual Activity: None   Other Topics Concern  . None   Social History Narrative   Pt lives at home with her Spouse. She has 3 children, (1 adopted). Spouse is a retired Development worker, international aid.   Caffeine Use: 1-2 cups daily.    Her Allergies Are:  Allergies  Allergen Reactions  . Flagyl [Metronidazole]   :   Her Current Medications Are:  Outpatient Encounter Prescriptions as of 05/22/2013  Medication Sig Dispense Refill  . acetaminophen (TYLENOL) 500 MG tablet Take 500 mg by mouth as needed.      . Calcium Carbonate-Vitamin D (CALTRATE 600+D PO) Take by mouth 2 (two) times daily.      Marland Kitchen EVISTA 60 MG tablet Take 1 tablet by mouth daily.      . indapamide (LOZOL) 2.5 MG tablet Daily.      . Loperamide HCl (IMODIUM PO) Take by mouth as needed.      Marland Kitchen  LORazepam (ATIVAN) 0.5 MG tablet Take 2.5 mg by mouth Daily.       . mirtazapine (REMERON) 45 MG tablet Daily.      . Multiple Vitamins-Minerals (CENTRUM SILVER PO) Take by mouth daily.      . Oxycodone HCl 10 MG TABS Take by mouth.      . polyethylene glycol powder (GLYCOLAX/MIRALAX) powder Take by mouth as needed.       . promethazine (PHENERGAN) 12.5 MG tablet Take 12.5 mg by mouth every 6 (six) hours as needed for nausea.      . psyllium (METAMUCIL) 58.6 % packet Take 1 packet by mouth daily.      . quinapril (ACCUPRIL) 40 MG tablet Daily.      . SUMAtriptan (IMITREX) 25 MG tablet Take 25 mg by mouth as needed.      . zolpidem (AMBIEN) 10 MG tablet as needed.        Facility-Administered Encounter Medications as of 05/22/2013   Medication Dose Route Frequency Provider Last Rate Last Dose  . valproate (DEPACON) 1,000 mg in sodium chloride 0.9 % 100 mL IVPB  1,000 mg Intravenous Continuous Suanne Marker, MD   1,000 mg at 02/22/13 1253  : Review of Systems  Gastrointestinal: Positive for constipation.  Neurological: Positive for headaches.    Objective:  Neurologic Exam  Physical Exam Physical Examination:   Filed Vitals:   05/22/13 1530  BP: 131/67  Pulse: 70  Temp: 97.9 F (36.6 C)    General Examination: The patient is a very pleasant 79 y.o. female in no acute distress. She appears well-developed and well-nourished and well groomed.   Head: Normocephalic, atraumatic,no palpable  vessels at her temples no temporal tenderness. neck is supple with FROM Ears, Nose and Throat:   speech is clear Neck: supple Respiratory: clear to auscultation bilaterally Cardiovascular: regular rate rhythm,  with no murmurs, rubs, or gallops Musculoskeletal: no pitting edema, no joint defomites, no muscle tenderness Skin: slightly dry and warm to touch Trunk:  normal posture for age  Neurologic Exam  Mental Status: pleasant, awake, alert, cooperative to history, talking, and casual conversation.  oriented in all 3 spheres,  good memory noted. Cranial Nerves: CN II-XII pupils were equal round reactive to light.  Fundi were sharp bilaterally.  Extraocular movements were full.  Visual fields were full on confrontational test.  Facial sensation and strength were normal.  Hearing was intact to finger rubbing bilaterally.  Uvula tongue were midline.  Head turning and shoulder shrugging were normal and symmetric.  Tongue protrusion into the cheeks strength were normal.  Motor: Normal tone, bulk, and strength. Sensory: Normal to light touch. Coordination: Normal finger-to-nose. There was no dysmetria Or ataxia noticed. Gait and Station: Narrow based and steady,  posture age-appropriate.Romberg sign: Negative Reflexes: Deep  tendon reflexes:  2+ throughout, she has an area of redness around her L knee, s/p recent staph infection.   Assessment and Plan:   In summary, Christina Escobar is a very pleasant 79 y.o.-year old female with a history of migraine headaches since her 41s. She has been well controlled over the past years but experienced a longer lasting migraine attacks prompting her to go to the emergency room 2 days in a row in December. She was given Depacon infusion in June and has had limited use of Imitrex in the last 6 months. She is requesting a Prednisone Rx to be able to use it as needed if she has a a prednisone taper  at the time. She had IV depacon in June for a flare up. She feels, that her cutting back on her Miralax has helped her migraines and she kept a diary. She has a non-focal exam. Her migraines are triggered by jet lag with sleep deprivation, dehydration and changes in her food schedule. She has taken 9 pills of Imitrex since February and has taken oxycodone of 5 occasions. I do not think she will need daily preventative medications. I recommended that she keep herself well hydrated keep a good sleep-wake schedule.  As far as medications are concerned, I recommended the following at this time: Use Imitrex sparingly, avoid narcotics, and I prescribed a short Prednisone course for 3 days, for as needed use, especially if she is on a trip out of town. I answered all her questions today and the patient was in agreement with the above outlined plan. I would like to see the patient back in 6 months, sooner if the need arises and encouraged her to call with any interim questions, concerns, problems or updates.

## 2013-05-22 NOTE — Patient Instructions (Addendum)
Please remember, common headache triggers are: sleep deprivation, dehydration, overheating, stress, hypoglycemia or skipping meals and blood sugar fluctuations, excessive pain medications or excessive alcohol use or caffeine withdrawal. Some people have food triggers such as aged cheese, orange juice or chocolate, especially dark chocolate, or MSG (monosodium glutamate). Try to avoid these headache triggers as much possible. It may be helpful to keep a headache diary to figure out what makes your headaches worse or brings them on and what alleviates them. Some people report headache onset after exercise but studies have shown that regular exercise may actually prevent headaches from coming. If you have exercise-induced headaches, please make sure that you drink plenty of fluid before and after exercising and that you do not over do it and do not overheat. I have prescribed a 3 day course of oral prednisone for use as needed, especially when you are away on a trip. Use Imitrex sparingly.

## 2013-08-26 ENCOUNTER — Encounter (HOSPITAL_BASED_OUTPATIENT_CLINIC_OR_DEPARTMENT_OTHER): Payer: Self-pay | Admitting: Emergency Medicine

## 2013-08-26 ENCOUNTER — Emergency Department (HOSPITAL_BASED_OUTPATIENT_CLINIC_OR_DEPARTMENT_OTHER)
Admission: EM | Admit: 2013-08-26 | Discharge: 2013-08-26 | Disposition: A | Discharge status: Home / Self Care | Payer: Medicare Other | Attending: Emergency Medicine | Admitting: Emergency Medicine

## 2013-08-26 DIAGNOSIS — F3289 Other specified depressive episodes: Secondary | ICD-10-CM | POA: Insufficient documentation

## 2013-08-26 DIAGNOSIS — IMO0002 Reserved for concepts with insufficient information to code with codable children: Secondary | ICD-10-CM | POA: Insufficient documentation

## 2013-08-26 DIAGNOSIS — G4700 Insomnia, unspecified: Secondary | ICD-10-CM | POA: Insufficient documentation

## 2013-08-26 DIAGNOSIS — R6889 Other general symptoms and signs: Secondary | ICD-10-CM | POA: Insufficient documentation

## 2013-08-26 DIAGNOSIS — R519 Headache, unspecified: Secondary | ICD-10-CM

## 2013-08-26 DIAGNOSIS — Z85828 Personal history of other malignant neoplasm of skin: Secondary | ICD-10-CM | POA: Insufficient documentation

## 2013-08-26 DIAGNOSIS — J029 Acute pharyngitis, unspecified: Secondary | ICD-10-CM | POA: Insufficient documentation

## 2013-08-26 DIAGNOSIS — F329 Major depressive disorder, single episode, unspecified: Secondary | ICD-10-CM | POA: Insufficient documentation

## 2013-08-26 DIAGNOSIS — Z8669 Personal history of other diseases of the nervous system and sense organs: Secondary | ICD-10-CM | POA: Insufficient documentation

## 2013-08-26 DIAGNOSIS — Z8719 Personal history of other diseases of the digestive system: Secondary | ICD-10-CM | POA: Insufficient documentation

## 2013-08-26 DIAGNOSIS — Z79899 Other long term (current) drug therapy: Secondary | ICD-10-CM | POA: Insufficient documentation

## 2013-08-26 DIAGNOSIS — I10 Essential (primary) hypertension: Secondary | ICD-10-CM | POA: Insufficient documentation

## 2013-08-26 DIAGNOSIS — G43909 Migraine, unspecified, not intractable, without status migrainosus: Secondary | ICD-10-CM | POA: Insufficient documentation

## 2013-08-26 DIAGNOSIS — K589 Irritable bowel syndrome without diarrhea: Secondary | ICD-10-CM | POA: Insufficient documentation

## 2013-08-26 DIAGNOSIS — F411 Generalized anxiety disorder: Secondary | ICD-10-CM | POA: Insufficient documentation

## 2013-08-26 MED ORDER — DEXAMETHASONE SODIUM PHOSPHATE 10 MG/ML IJ SOLN
6.0000 mg | Freq: Once | INTRAMUSCULAR | Status: AC
  Administered 2013-08-26: 6 mg via INTRAVENOUS
  Filled 2013-08-26: qty 1

## 2013-08-26 MED ORDER — METOCLOPRAMIDE HCL 5 MG/ML IJ SOLN
10.0000 mg | Freq: Once | INTRAMUSCULAR | Status: AC
  Administered 2013-08-26: 10 mg via INTRAVENOUS
  Filled 2013-08-26: qty 2

## 2013-08-26 MED ORDER — FENTANYL CITRATE 0.05 MG/ML IJ SOLN
50.0000 ug | Freq: Once | INTRAMUSCULAR | Status: AC
  Administered 2013-08-26: 50 ug via INTRAVENOUS
  Filled 2013-08-26: qty 2

## 2013-08-26 MED ORDER — SODIUM CHLORIDE 0.9 % IV BOLUS (SEPSIS)
500.0000 mL | Freq: Once | INTRAVENOUS | Status: AC
  Administered 2013-08-26: 500 mL via INTRAVENOUS

## 2013-08-26 MED ORDER — DIPHENHYDRAMINE HCL 50 MG/ML IJ SOLN
25.0000 mg | Freq: Once | INTRAMUSCULAR | Status: AC
  Administered 2013-08-26: 25 mg via INTRAVENOUS
  Filled 2013-08-26: qty 1

## 2013-08-26 MED ORDER — HYDROMORPHONE HCL PF 1 MG/ML IJ SOLN
0.5000 mg | Freq: Once | INTRAMUSCULAR | Status: AC
  Administered 2013-08-26: 0.5 mg via INTRAVENOUS
  Filled 2013-08-26: qty 1

## 2013-08-26 MED ORDER — KETOROLAC TROMETHAMINE 15 MG/ML IJ SOLN: 15.0000 mg | Freq: Once | INTRAMUSCULAR | Status: DC

## 2013-08-26 MED ORDER — PREDNISONE 20 MG PO TABS: ORAL_TABLET | ORAL | Status: DC

## 2013-08-26 NOTE — ED Provider Notes (Signed)
CSN: [id number]     Arrival date & time 08/26/13  1441 History  This chart was scribed for Enid Skeens, MD by Dorothey Baseman, ED Scribe. This patient was seen in room MH01/MH01 and the patient's care was started at 4:08 PM.    Chief Complaint  Patient presents with  . Headache   Patient is a 79 y.o. female presenting with headaches. The history is provided by the patient. No language interpreter was used.  Headache Pain location:  Frontal and R temporal Onset quality:  Sudden Timing:  Constant Chronicity:  Recurrent Similar to prior headaches: yes   Context: activity   Relieved by:  Nothing Ineffective treatments:  Acetaminophen and prescription medications (oxycodon, imitrex, prednisone) Associated symptoms: sore throat   Associated symptoms: no abdominal pain, no back pain, no diarrhea, no fever, no neck pain, no numbness and no vomiting   Risk factors: insomnia    HPI Comments: Christina Escobar is a 79 y.o. Female with a history of migraines who presents to the Emergency Department complaining of a constant headache to the middle of the forehead and right temple area onset yesterday while driving home from the beach. She reports that her current symptoms feel similar to her prior migraines. She reports taking Imitrex,Tylenol, oxycodone, and prednisone at home without relief. She reports associated sore throat, rhinorrhea, and sneezing the presented just before the headache. She denies neck pain, chest pain, back pain, abdominal pain, weakness, numbness, visual disturbance, emesis, diarrhea, gait problems, or fever. Patient also has a history of HTN and insomnia.   Past Medical History  Diagnosis Date  . Hypertension   . Thrombosed hemorrhoids   . Cataracts, bilateral   . Migraine   . IBS (irritable bowel syndrome)   . Basal cell carcinoma   . Anxiety   . Depression   . Osteopenia   . Diverticulosis     hx of stricture in colon  . Hemorrhoids   . Insomnia    Past Surgical  History  Procedure Laterality Date  . Eye surgery  1999    bilateral cataract removal  . Colon surgery  2001    colon resection  . Incision and drainage  11/05/2011    thrombosed hemorrhoid  . Transanal excision of rectal mass with hemorrhoid injection  2007  . Basal cell carcinoma excision  2007,2008   Family History  Problem Relation Age of Onset  . Alzheimer's disease Mother   . Heart disease Father    History  Substance Use Topics  . Smoking status: Never Smoker   . Smokeless tobacco: Never Used  . Alcohol Use: Yes     Comment: occasional glass of wine    OB History   Grav Para Term Preterm Abortions TAB SAB Ect Mult Living                 Review of Systems  Constitutional: Negative for fever.  HENT: Positive for rhinorrhea, sneezing and sore throat.   Eyes: Negative for visual disturbance.  Cardiovascular: Negative for chest pain.  Gastrointestinal: Negative for vomiting, abdominal pain and diarrhea.  Musculoskeletal: Negative for back pain, gait problem and neck pain.  Neurological: Positive for headaches. Negative for weakness and numbness.  All other systems reviewed and are negative.    Allergies  Flagyl  Home Medications   Current Outpatient Rx  Name  Route  Sig  Dispense  Refill  . acetaminophen (TYLENOL) 500 MG tablet   Oral   Take 500 mg  by mouth as needed.         . Calcium Carbonate-Vitamin D (CALTRATE 600+D PO)   Oral   Take by mouth 2 (two) times daily.         Marland Kitchen EVISTA 60 MG tablet   Oral   Take 1 tablet by mouth daily.         . indapamide (LOZOL) 2.5 MG tablet      Daily.         . Loperamide HCl (IMODIUM PO)   Oral   Take by mouth as needed.         Marland Kitchen LORazepam (ATIVAN) 0.5 MG tablet   Oral   Take 2.5 mg by mouth Daily.          . mirtazapine (REMERON) 45 MG tablet      Daily.         . Multiple Vitamins-Minerals (CENTRUM SILVER PO)   Oral   Take by mouth daily.         . Oxycodone HCl 10 MG TABS    Oral   Take by mouth.         . polyethylene glycol powder (GLYCOLAX/MIRALAX) powder   Oral   Take by mouth as needed.          . predniSONE (DELTASONE) 20 MG tablet   Oral   Take 1 tablet (20 mg total) by mouth daily. For 3 days, for severe migraine attack.   3 tablet   0   . promethazine (PHENERGAN) 12.5 MG tablet   Oral   Take 12.5 mg by mouth every 6 (six) hours as needed for nausea.         . psyllium (METAMUCIL) 58.6 % packet   Oral   Take 1 packet by mouth daily.         . quinapril (ACCUPRIL) 40 MG tablet      Daily.         . SUMAtriptan (IMITREX) 25 MG tablet   Oral   Take 25 mg by mouth as needed.         . zolpidem (AMBIEN) 10 MG tablet      as needed.           Triage Vitals: BP 144/72  Pulse 97  Temp(Src) 99.7 F (37.6 C) (Oral)  Resp 16  Ht 5\' 3"  (1.6 m)  Wt 109 lb (49.442 kg)  BMI 19.31 kg/m2  SpO2 98%  Physical Exam  Nursing note and vitals reviewed. Constitutional: She is oriented to person, place, and time. She appears well-developed and well-nourished. No distress.  HENT:  Head: Normocephalic and atraumatic.  Eyes: Conjunctivae and EOM are normal. Pupils are equal, round, and reactive to light.  Neck: Normal range of motion. Neck supple.  Pulmonary/Chest: Effort normal. No respiratory distress.  Abdominal: She exhibits no distension.  Musculoskeletal: Normal range of motion.  Neurological: She is alert and oriented to person, place, and time. No cranial nerve deficit.  No drift. Finger to nose intact bilaterally. Normal strength and sensation throughout.  5+ strength in UE and LE with f/e at major joints. Sensation to palpation intact in UE and LE. CNs 2-12 grossly intact.  EOMFI.  PERRL.   Finger nose and coordination intact bilateral.   Visual fields intact to finger testing.   Skin: Skin is warm and dry.  Psychiatric: She has a normal mood and affect. Her behavior is normal.    ED Course  Procedures (including  critical care  time)  DIAGNOSTIC STUDIES: Oxygen Saturation is 98% on room air, normal by my interpretation.    COORDINATION OF CARE: 4:13 PM- Offered patient a CT of the head, but patient refused due to radiation concerns. Will order a migraine cocktail to manage symptoms. Discussed treatment plan with patient at bedside and patient verbalized agreement.   6:36 PM- Patient reports that her symptoms have not significantly improved. Will order UA. Will order Decadron, Dilaudid, and Reglan to manage symptoms. Discussed treatment plan with patient at bedside and patient verbalized agreement.   8:17 PM- Discussed that ESR results were normal. Patient reports feeling much better. Will discharge patient with prednisone. Discussed treatment plan with patient at bedside and patient verbalized agreement.   Labs Review Labs Reviewed  SEDIMENTATION RATE   Imaging Review No results found.  EKG Interpretation   None       MDM   1. Headache    I personally performed the services described in this documentation, which was scribed in my presence. The recorded information has been reviewed and is accurate.  HA similar to previous, pt refuses head CT, she has capacity to make decisions, she says identical to previous and she understands I cannot diagnose bleed without. First recheck no improvement. Steroids and dilaudid given. Fluids given. Pt improved on second recheck.  No meningeal signs. ESR low.    Results and differential diagnosis were discussed with the patient. Close follow up outpatient was discussed, patient comfortable with the plan.     Enid Skeens, MD 08/27/13 [phone]

## 2013-08-26 NOTE — ED Notes (Signed)
Pt c/o h/a and nausea x 1 day 

## 2013-11-20 ENCOUNTER — Ambulatory Visit (INDEPENDENT_AMBULATORY_CARE_PROVIDER_SITE_OTHER): Payer: Medicare Other | Admitting: Neurology

## 2013-11-20 ENCOUNTER — Encounter (INDEPENDENT_AMBULATORY_CARE_PROVIDER_SITE_OTHER): Payer: Self-pay

## 2013-11-20 ENCOUNTER — Encounter: Payer: Self-pay | Admitting: Neurology

## 2013-11-20 VITALS — BP 110/58 | HR 78 | Temp 97.0°F | Ht 63.0 in | Wt 111.0 lb

## 2013-11-20 DIAGNOSIS — G43909 Migraine, unspecified, not intractable, without status migrainosus: Secondary | ICD-10-CM

## 2013-11-20 NOTE — Progress Notes (Signed)
Subjective:    Patient ID: Christina Escobar is a 80 y.o. female.  HPI    Interim history:   Christina Escobar is a very pleasant 80 year old right-handed woman with an underlying medical history basal cell cancer, anxiety, depression, osteopenia, irritable bowel syndrome, hemorrhoids, insomnia and diverticulosis. who presents for followup consultation of her migraine headaches. She is unaccompanied today. I last saw her on 05/22/2013, at which time I asked her to stay well-hydrated, keep a scheduled sleep and wake time and avoid jetlag, and use Imitrex sparingly, avoid narcotics, and I prescribed a short Prednisone course for 3 days, for as needed use, especially if she is on a trip out of town. In the interim, she presented to the emergency room on 08/26/2013 with a migraine attack which did not improve with her home medication regimen including her prednisone. She was treated with Toradol and Benadryl, then IV fluids and fentanyl, Dilaudid, and Reglan, she eventually improved and was discharged on a four-day course of prednisone.  Today, she reports, that she is generally stable. She has recent seen her PCP for her yearly check up. She generally has a mild headache once a week. Her headaches usually are at night. She tries to eat regularly and drinks water. She and her husband live at [facility].   I first met her on 09/21/2012, at which time she gave a long-standing history of migraine headaches since age 28 approximately. In the past she used Cafergot and Imitrex. She had seen Dr. Jannifer Franklin in our office in the past. She had flareup of her migraines in December 2013, and was treated in the emergency room with IV fluids and oxycodone as well as prednisone. In the interim, on 02/22/2013 she came in for Depacon infusion 1000 milligrams, which was marginally helpful. She was then seen by our nurse practitioner on 02/28/2013 in followup and reported feeling better.   Her Past Medical History Is  Significant For: Past Medical History  Diagnosis Date  . Hypertension   . Thrombosed hemorrhoids   . Cataracts, bilateral   . Migraine   . IBS (irritable bowel syndrome)   . Basal cell carcinoma   . Anxiety   . Depression   . Osteopenia   . Diverticulosis     hx of stricture in colon  . Hemorrhoids   . Insomnia     Her Past Surgical History Is Significant For: Past Surgical History  Procedure Laterality Date  . Eye surgery  1999    bilateral cataract removal  . Colon surgery  2001    colon resection  . Incision and drainage  11/05/2011    thrombosed hemorrhoid  . Transanal excision of rectal mass with hemorrhoid injection  2007  . Basal cell carcinoma excision  2007,2008    Her Family History Is Significant For: Family History  Problem Relation Age of Onset  . Alzheimer's disease Mother   . Heart disease Father     Her Social History Is Significant For: History   Social History  . Marital Status: Married    Spouse Name: Paediatric nurse    Number of Children: 2  . Years of Education: MA   Social History Main Topics  . Smoking status: Never Smoker   . Smokeless tobacco: Never Used  . Alcohol Use: Yes     Comment: occasional glass of wine   . Drug Use: No  . Sexual Activity: None   Other Topics Concern  . None   Social History Narrative  Pt lives at home with her Spouse. She has 3 children, (1 adopted). Spouse is a retired Film/video editor.   Caffeine Use: 1-2 cups daily.    Her Allergies Are:  Allergies  Allergen Reactions  . Flagyl [Metronidazole]   :   Her Current Medications Are:  Outpatient Encounter Prescriptions as of 11/20/2013  Medication Sig  . acetaminophen (TYLENOL) 500 MG tablet Take 500 mg by mouth as needed.  . Calcium Carbonate-Vitamin D (CALTRATE 600+D PO) Take by mouth 2 (two) times daily.  Marland Kitchen EVISTA 60 MG tablet Take 1 tablet by mouth daily.  . indapamide (LOZOL) 2.5 MG tablet Daily.  . Loperamide HCl (IMODIUM PO) Take by mouth as needed.   Marland Kitchen LORazepam (ATIVAN) 0.5 MG tablet Take 2.5 mg by mouth Daily.   . mirtazapine (REMERON) 45 MG tablet Daily.  . Multiple Vitamins-Minerals (CENTRUM SILVER PO) Take by mouth daily.  . Oxycodone HCl 10 MG TABS Take by mouth.  . polyethylene glycol powder (GLYCOLAX/MIRALAX) powder Take by mouth as needed.   . predniSONE (DELTASONE) 20 MG tablet Take 1 tablet (20 mg total) by mouth daily. For 3 days, for severe migraine attack.  . predniSONE (DELTASONE) 20 MG tablet 2 tabs po daily x 4 days  . psyllium (METAMUCIL) 58.6 % packet Take 1 packet by mouth daily.  . quinapril (ACCUPRIL) 40 MG tablet Daily.  . SUMAtriptan (IMITREX) 25 MG tablet Take 25 mg by mouth as needed.  . zolpidem (AMBIEN) 10 MG tablet as needed.   . promethazine (PHENERGAN) 12.5 MG tablet Take 12.5 mg by mouth every 6 (six) hours as needed for nausea.   Review of Systems:  Out of a complete 14 point review of systems, all are reviewed and negative with the exception of these symptoms as listed below:   Review of Systems  Constitutional: Negative.   Eyes: Negative.   Respiratory: Negative.   Cardiovascular: Negative.   Gastrointestinal: Negative.   Endocrine: Negative.   Genitourinary: Negative.   Musculoskeletal: Negative.   Skin: Negative.   Allergic/Immunologic: Negative.   Neurological: Positive for headaches.  Hematological: Negative.   Psychiatric/Behavioral: Positive for sleep disturbance (eds).    Objective:  Neurologic Exam  Physical Exam Physical Examination:   Filed Vitals:   11/20/13 1425  BP: 110/58  Pulse: 78  Temp: 97 F (36.1 C)    General Examination: The patient is a very pleasant 80 y.o. female in no acute distress. She appears well-developed and well-nourished and well groomed.   Head: Normocephalic, atraumatic,no palpable  vessels at her temples no temporal tenderness. neck is supple with FROM Ears, Nose and Throat:   speech is clear Neck: supple Respiratory: clear to auscultation  bilaterally Cardiovascular: regular rate rhythm,  with no murmurs, rubs, or gallops Musculoskeletal: no pitting edema, no joint defomites, no muscle tenderness Skin: slightly dry and warm to touch Trunk:  normal posture for age  Neurologic Exam  Mental Status: pleasant, awake, alert, cooperative to history, talking, and casual conversation, oriented in all 3 spheres,  good memory noted.  Cranial Nerves: CN II-XII pupils were equal round reactive to light. Fundi were sharp bilaterally. Extraocular movements were full.  Visual fields were full on confrontational test.  Facial sensation and strength were normal.  Hearing was intact to finger rubbing bilaterally.  Uvula tongue were midline. Head turning and shoulder shrugging were normal and symmetric.  Tongue protrusion into the cheeks strength were normal.  Motor: Normal tone, bulk, and strength. Sensory: Normal to light touch and .  Coordination: Normal finger-to-nose. There was no dysmetria or ataxia noticed. Gait and Station: Narrow based and steady,  posture age-appropriate. Romberg: Negative Reflexes: Deep tendon reflexes are 2+ throughout.   Assessment and Plan:   In summary, Christina Escobar is a very pleasant 80 year old female with a history of migraine headaches since her 36s. She has been well controlled over the past years but experienced a longer lasting migraine attack prompting her to go to the emergency room in December. She was given multiple medications, which eventually helped. She has been using tylenol, oxycodone, and imitrex prn. She felt, that cutting back on her Miralax has helped her migraines and she keeps. She has a non-focal exam. Her migraines are triggered by jet lag with sleep deprivation, dehydration and changes in her food schedule. She has infrequent migraines enough, that she will not need daily preventative medications. I recommended that she keep herself well hydrated keep a good sleep-wake schedule.  As far as  medications are concerned, I recommended the following at this time: Use Imitrex sparingly, avoid narcotics, and she can take excedrine without aspirin.  I answered all her questions today and the patient was in agreement with the above outlined plan. I would like to see the patient back in 6 months, sooner if the need arises and encouraged her to call with any interim questions, concerns, problems or updates.

## 2013-11-20 NOTE — Patient Instructions (Addendum)
Please remember, common headache triggers are: sleep deprivation, dehydration, overheating, stress, hypoglycemia or skipping meals and blood sugar fluctuations, excessive pain medications or excessive alcohol use or caffeine withdrawal. Some people have food triggers such as aged cheese, orange juice or chocolate, especially dark chocolate, or MSG (monosodium glutamate). Try to avoid these headache triggers as much possible. It may be helpful to keep a headache diary to figure out what makes your headaches worse or brings them on and what alleviates them. Some people report headache onset after exercise but studies have shown that regular exercise may actually prevent headaches from coming. If you have exercise-induced headaches, please make sure that you drink plenty of fluid before and after exercising and that you do not over do it and do not overheat.  I do not think the prednisone has helped you. Take tylenol as needed. Use Imitrex as needed. Avoid oxycodone. You can take excedrine without aspirin as needed, but remember, it has Tylenol in it. You can repeat Imitrex after 1 hour for a total of 2 pills in 24 hours. Do not take Imitrex more than 3 times a week.

## 2014-04-10 ENCOUNTER — Other Ambulatory Visit: Payer: Self-pay

## 2014-04-10 DIAGNOSIS — Z1231 Encounter for screening mammogram for malignant neoplasm of breast: Secondary | ICD-10-CM

## 2014-05-04 ENCOUNTER — Ambulatory Visit
Admission: RE | Admit: 2014-05-04 | Discharge: 2014-05-04 | Disposition: A | Discharge status: Home / Self Care | Payer: Medicare Other | Source: Ambulatory Visit

## 2014-05-04 ENCOUNTER — Encounter (INDEPENDENT_AMBULATORY_CARE_PROVIDER_SITE_OTHER): Payer: Self-pay

## 2014-05-04 DIAGNOSIS — Z1231 Encounter for screening mammogram for malignant neoplasm of breast: Secondary | ICD-10-CM

## 2014-05-24 ENCOUNTER — Ambulatory Visit: Payer: Medicare Other | Admitting: Neurology

## 2014-09-08 ENCOUNTER — Emergency Department (HOSPITAL_COMMUNITY)
Admission: EM | Admit: 2014-09-08 | Discharge: 2014-09-08 | Disposition: A | Discharge status: Home / Self Care | Payer: Medicare Other | Attending: Emergency Medicine | Admitting: Emergency Medicine

## 2014-09-08 ENCOUNTER — Encounter (HOSPITAL_COMMUNITY): Payer: Self-pay | Admitting: Family Medicine

## 2014-09-08 ENCOUNTER — Emergency Department (HOSPITAL_COMMUNITY): Payer: Medicare Other

## 2014-09-08 DIAGNOSIS — Y93K1 Activity, walking an animal: Secondary | ICD-10-CM | POA: Insufficient documentation

## 2014-09-08 DIAGNOSIS — G43909 Migraine, unspecified, not intractable, without status migrainosus: Secondary | ICD-10-CM | POA: Diagnosis not present

## 2014-09-08 DIAGNOSIS — F419 Anxiety disorder, unspecified: Secondary | ICD-10-CM | POA: Insufficient documentation

## 2014-09-08 DIAGNOSIS — Y998 Other external cause status: Secondary | ICD-10-CM | POA: Diagnosis not present

## 2014-09-08 DIAGNOSIS — W19XXXA Unspecified fall, initial encounter: Secondary | ICD-10-CM

## 2014-09-08 DIAGNOSIS — Z79899 Other long term (current) drug therapy: Secondary | ICD-10-CM | POA: Insufficient documentation

## 2014-09-08 DIAGNOSIS — S4992XA Unspecified injury of left shoulder and upper arm, initial encounter: Secondary | ICD-10-CM | POA: Insufficient documentation

## 2014-09-08 DIAGNOSIS — I10 Essential (primary) hypertension: Secondary | ICD-10-CM | POA: Diagnosis not present

## 2014-09-08 DIAGNOSIS — S32512A Fracture of superior rim of left pubis, initial encounter for closed fracture: Secondary | ICD-10-CM | POA: Diagnosis not present

## 2014-09-08 DIAGNOSIS — Z8719 Personal history of other diseases of the digestive system: Secondary | ICD-10-CM | POA: Diagnosis not present

## 2014-09-08 DIAGNOSIS — G4700 Insomnia, unspecified: Secondary | ICD-10-CM | POA: Insufficient documentation

## 2014-09-08 DIAGNOSIS — M25512 Pain in left shoulder: Secondary | ICD-10-CM

## 2014-09-08 DIAGNOSIS — Z85828 Personal history of other malignant neoplasm of skin: Secondary | ICD-10-CM | POA: Insufficient documentation

## 2014-09-08 DIAGNOSIS — Z7952 Long term (current) use of systemic steroids: Secondary | ICD-10-CM | POA: Diagnosis not present

## 2014-09-08 DIAGNOSIS — S32592A Other specified fracture of left pubis, initial encounter for closed fracture: Secondary | ICD-10-CM

## 2014-09-08 DIAGNOSIS — F329 Major depressive disorder, single episode, unspecified: Secondary | ICD-10-CM | POA: Diagnosis not present

## 2014-09-08 DIAGNOSIS — Y9289 Other specified places as the place of occurrence of the external cause: Secondary | ICD-10-CM | POA: Diagnosis not present

## 2014-09-08 DIAGNOSIS — W01198A Fall on same level from slipping, tripping and stumbling with subsequent striking against other object, initial encounter: Secondary | ICD-10-CM | POA: Diagnosis not present

## 2014-09-08 MED ORDER — HYDROCODONE-ACETAMINOPHEN 5-325 MG PO TABS
1.0000 | ORAL_TABLET | Freq: Once | ORAL | Status: AC
  Administered 2014-09-08: 1 via ORAL
  Filled 2014-09-08: qty 1

## 2014-09-08 MED ORDER — HYDROCODONE-ACETAMINOPHEN 5-325 MG PO TABS: 1.0000 | ORAL_TABLET | Freq: Four times a day (QID) | ORAL | Status: DC | PRN

## 2014-09-08 NOTE — ED Notes (Signed)
Waiting on [facility] to bring [facility] to the ED

## 2014-09-08 NOTE — Discharge Instructions (Signed)
Stable Pelvic Fracture °You have one or more fractures (this means there is a break in the bones) of the pelvis. The pelvis is the ring of bones that make up your hipbones. These are the bones you sit on and the lower part of the spine. It is like a boney ring where your legs attach and which supports your upper body. You have an undisplaced fracture. This means the bones are in good position. The pelvic fracture you have is a simple (uncomplicated) fracture. °DIAGNOSIS  °X-rays usually diagnose these fractures. °TREATMENT  °The goal of treating pelvic fractures is to get the bones to heal in a good position. The patient should return to normal activities as soon as possible. Such fractures are often treated with normal bed rest and conservative measures.  °HOME CARE INSTRUCTIONS  °· You should be on bed rest for as long as directed by your caregiver. Change positions of your legs every 1-2 hours to maintain good blood flow. You may sit as long as is tolerable. Following this, you may do usual activities, but avoid strenuous activities for as long as directed by your caregiver. °· Only take over-the-counter or prescription medicines for pain, discomfort, or fever as directed by your caregiver. °· Bed rest may also be used for discomfort. °· Resume your activities when you are able. Use a cane or crutch on the injured side to reduce pain while walking, as needed. °· If you develop increased pain or discomfort not relieved with medications, contact your caregiver. °· Warning: Do not drive a car or operate a motor vehicle until your caregiver specifically tells you it is safe to do so. °SEEK IMMEDIATE MEDICAL CARE IF:  °· You feel light-headed or faint, develop chest pain or shortness of breath. °· An unexplained oral temperature above 102° F (38.9° C) develops. °· You develop blood in the urine or in the stools. °· There is difficulty urinating, and/or having a bowel movement, or pain with these efforts. °· There is a  difficulty or increased pain with walking. °· There is swelling in one or both legs that is not normal. °Document Released: 11/09/2001 Document Revised: 01/15/2014 Document Reviewed: 04/13/2008 °[employer]® Patient Information ©2015 [employer]. This information is not intended to replace advice given to you by your health care provider. Make sure you discuss any questions you have with your health care provider. ° °

## 2014-09-08 NOTE — ED Provider Notes (Signed)
CSN: [id number]     Arrival date & time 09/08/14  1008 History  This chart was scribed for non-physician practitioner working with Orpah Greek, by Molli Posey, ED Scribe. This patient was seen in room TR11C/TR11C and the patient's care was started at 11:58 AM.      Chief Complaint  Patient presents with  . Fall   The history is provided by the patient. No language interpreter was used.   HPI Comments: Christina Escobar is a 80 y.o. female with a history of HTN who presents to the Emergency Department complaining of a fall that occurred this morning at 9AM. Pt states she was walking her dog and her dog took off and caused her to fall to the ground. She complains of slight pain in her left arm and left groin area. She denies LOC. Pt states her pain worsens with certain movements. She reports she is not on any blood thinning medication. She reports no alleviating factors at this time.     Past Medical History  Diagnosis Date  . Hypertension   . Thrombosed hemorrhoids   . Cataracts, bilateral   . Migraine   . IBS (irritable bowel syndrome)   . Basal cell carcinoma   . Anxiety   . Depression   . Osteopenia   . Diverticulosis     hx of stricture in colon  . Hemorrhoids   . Insomnia    Past Surgical History  Procedure Laterality Date  . Eye surgery  1999    bilateral cataract removal  . Colon surgery  2001    colon resection  . Incision and drainage  11/05/2011    thrombosed hemorrhoid  . Transanal excision of rectal mass with hemorrhoid injection  2007  . Basal cell carcinoma excision  2007,2008   Family History  Problem Relation Age of Onset  . Alzheimer's disease Mother   . Heart disease Father    History  Substance Use Topics  . Smoking status: Never Smoker   . Smokeless tobacco: Never Used  . Alcohol Use: Yes     Comment: occasional glass of wine    OB History    No data available     Review of Systems  Musculoskeletal: Positive for myalgias and  arthralgias.  All other systems reviewed and are negative.   Allergies  Flagyl  Home Medications   Prior to Admission medications   Medication Sig Start Date End Date Taking? Authorizing Provider  acetaminophen (TYLENOL) 500 MG tablet Take 500 mg by mouth as needed.    Historical Provider, MD  Calcium Carbonate-Vitamin D (CALTRATE 600+D PO) Take by mouth 2 (two) times daily.    Historical Provider, MD  EVISTA 60 MG tablet Take 1 tablet by mouth daily. 01/30/13   Historical Provider, MD  indapamide (LOZOL) 2.5 MG tablet Daily. 10/16/11   Historical Provider, MD  Loperamide HCl (IMODIUM PO) Take by mouth as needed.    Historical Provider, MD  LORazepam (ATIVAN) 0.5 MG tablet Take 2.5 mg by mouth Daily.  10/29/11   Historical Provider, MD  mirtazapine (REMERON) 45 MG tablet Daily. 10/16/11   Historical Provider, MD  Multiple Vitamins-Minerals (CENTRUM SILVER PO) Take by mouth daily.    Historical Provider, MD  Oxycodone HCl 10 MG TABS Take by mouth.    Historical Provider, MD  polyethylene glycol powder (GLYCOLAX/MIRALAX) powder Take by mouth as needed.  10/16/11   Historical Provider, MD  predniSONE (DELTASONE) 20 MG tablet Take 1 tablet (20 mg  total) by mouth daily. For 3 days, for severe migraine attack. 05/22/13   Star Age, MD  predniSONE (DELTASONE) 20 MG tablet 2 tabs po daily x 4 days 08/26/13   Mariea Clonts, MD  promethazine (PHENERGAN) 12.5 MG tablet Take 12.5 mg by mouth every 6 (six) hours as needed for nausea.    Historical Provider, MD  psyllium (METAMUCIL) 58.6 % packet Take 1 packet by mouth daily.    Historical Provider, MD  quinapril (ACCUPRIL) 40 MG tablet Daily. 10/16/11   Historical Provider, MD  SUMAtriptan (IMITREX) 25 MG tablet Take 25 mg by mouth as needed.    Historical Provider, MD  zolpidem (AMBIEN) 10 MG tablet as needed.  09/21/11   Historical Provider, MD   BP 131/56 mmHg  Pulse 78  Temp(Src) 98.2 F (36.8 C) (Oral)  Resp 18  SpO2 100% Physical Exam   Constitutional: She is oriented to person, place, and time. She appears well-developed and well-nourished. No distress.  HENT:  Head: Normocephalic and atraumatic.  Right Ear: External ear normal.  Left Ear: External ear normal.  Mouth/Throat: Oropharynx is clear and moist.  Eyes: Conjunctivae are normal. Pupils are equal, round, and reactive to light. Right eye exhibits no discharge. Left eye exhibits no discharge.  Neck: Neck supple. No tracheal deviation present.  Cardiovascular: Normal rate.   Pulmonary/Chest: Effort normal. No respiratory distress.  Abdominal: She exhibits no distension.  Musculoskeletal:       Cervical back: Normal.       Thoracic back: Normal.       Lumbar back: Normal.  No shortening or rotation of the left leg. Pt has full rom with pain. No deformity noted. Tender in the lateral left shoulder. Full rom no deformity or swelling  area  Neurological: She is alert and oriented to person, place, and time.  Skin: Skin is warm and dry. She is not diaphoretic.  Psychiatric: She has a normal mood and affect. Her behavior is normal.  Nursing note and vitals reviewed.   ED Course  Procedures   DIAGNOSTIC STUDIES: Oxygen Saturation is 100% on RA, normal by my interpretation.    COORDINATION OF CARE: 12:02 PM Discussed treatment plan with pt at bedside and pt agreed to plan.   Labs Review Labs Reviewed - No data to display  Imaging Review Dg Hip Complete Left  09/08/2014   CLINICAL DATA:  80 year old female fell in her yd today while walking dog. Left hip pain. Initial encounter.  EXAM: LEFT HIP - COMPLETE 2+ VIEW  COMPARISON:  None.  FINDINGS: Bone mineralization is within normal limits for age. The femoral heads are normally located. Hip joint spaces are preserved. Grossly intact proximal right femur. Proximal left femur appears intact.  However, there is a minimally displaced fracture of the medial left superior pubic ramus, about 12 mm lateral to the symphysis  pubis. The fracture may have a horizontal component tracking toward the symphysis. The inferior pubic rami appear intact. No other acute pelvic fracture. SI joints within normal limits.  IMPRESSION: 1. Mildly displaced fracture of the medial left superior pubic ramus. 2. No other acute fracture or dislocation identified about the left hip.   Electronically Signed   By: Lars Pinks M.D.   On: 09/08/2014 14:24   Dg Shoulder Left  09/08/2014   CLINICAL DATA:  Fall, acute left shoulder pain earlier today  EXAM: LEFT SHOULDER - 2+ VIEW  COMPARISON:  None.  FINDINGS: Limited two view exam. Patient was unable  to perform an axillary view. Degenerative changes of the glenohumeral joint with glenoid bony spurring. No gross malalignment or fracture. AC joint aligned.  IMPRESSION: Limited exam.  No definite acute osseous finding.  Degenerative changes.   Electronically Signed   By: Daryll Brod M.D.   On: 09/08/2014 14:22     EKG Interpretation None      MDM   Final diagnoses:  Fracture of multiple pubic rami, left, closed, initial encounter    Pt [facility] walker here by [facility]. Pt is neurologically intact. Will treat with hydrocodone for pain discussed follow up with ortho. Pt lives at [facility] and has assistance if needed   I personally performed the services described in this documentation, which was scribed in my presence. The recorded information has been reviewed and is accurate.       Glendell Docker, NP 09/08/14 [place], MD 09/09/14 [phone]

## 2014-09-08 NOTE — ED Notes (Signed)
Pt sts that she was walking her dog and her dog took off and cause her to fall to the ground. sts slight pain in left arm and left groin area. Pt MAE well.

## 2014-09-21 ENCOUNTER — Ambulatory Visit (INDEPENDENT_AMBULATORY_CARE_PROVIDER_SITE_OTHER): Payer: Medicare Other | Admitting: Family Medicine

## 2014-09-21 VITALS — BP 126/68 | HR 82 | Temp 98.2°F | Resp 17 | Ht 63.0 in | Wt 112.0 lb

## 2014-09-21 DIAGNOSIS — R05 Cough: Secondary | ICD-10-CM

## 2014-09-21 DIAGNOSIS — J029 Acute pharyngitis, unspecified: Secondary | ICD-10-CM

## 2014-09-21 DIAGNOSIS — R059 Cough, unspecified: Secondary | ICD-10-CM

## 2014-09-21 LAB — POCT RAPID STREP A (OFFICE): Rapid Strep A Screen: NEGATIVE

## 2014-09-21 MED ORDER — BENZONATATE 100 MG PO CAPS: ORAL_CAPSULE | ORAL | Status: DC

## 2014-09-21 NOTE — Patient Instructions (Addendum)
Drink plenty of fluids and get enough rest  Take tylenol 500 mg 1-2 tablets three times daily if needed for pain  Return if worse  Take Tessalon one pill 3 times daily (benzonatate) if needed for cough    Pharyngitis Pharyngitis is redness, pain, and swelling (inflammation) of your pharynx.  CAUSES  Pharyngitis is usually caused by infection. Most of the time, these infections are from viruses (viral) and are part of a cold. However, sometimes pharyngitis is caused by bacteria (bacterial). Pharyngitis can also be caused by allergies. Viral pharyngitis may be spread from person to person by coughing, sneezing, and personal items or utensils (cups, forks, spoons, toothbrushes). Bacterial pharyngitis may be spread from person to person by more intimate contact, such as kissing.  SIGNS AND SYMPTOMS  Symptoms of pharyngitis include:   Sore throat.   Tiredness (fatigue).   Low-grade fever.   Headache.  Joint pain and muscle aches.  Skin rashes.  Swollen lymph nodes.  Plaque-like film on throat or tonsils (often seen with bacterial pharyngitis). DIAGNOSIS  Your health care provider will ask you questions about your illness and your symptoms. Your medical history, along with a physical exam, is often all that is needed to diagnose pharyngitis. Sometimes, a rapid strep test is done. Other lab tests may also be done, depending on the suspected cause.  TREATMENT  Viral pharyngitis will usually get better in 3-4 days without the use of medicine. Bacterial pharyngitis is treated with medicines that kill germs (antibiotics).  HOME CARE INSTRUCTIONS   Drink enough water and fluids to keep your urine clear or pale yellow.   Only take over-the-counter or prescription medicines as directed by your health care provider:   If you are prescribed antibiotics, make sure you finish them even if you start to feel better.   Do not take aspirin.   Get lots of rest.   Gargle with 8 oz of  salt water ( tsp of salt per 1 qt of water) as often as every 1-2 hours to soothe your throat.   Throat lozenges (if you are not at risk for choking) or sprays may be used to soothe your throat. SEEK MEDICAL CARE IF:   You have large, tender lumps in your neck.  You have a rash.  You cough up green, yellow-brown, or bloody spit. SEEK IMMEDIATE MEDICAL CARE IF:   Your neck becomes stiff.  You drool or are unable to swallow liquids.  You vomit or are unable to keep medicines or liquids down.  You have severe pain that does not go away with the use of recommended medicines.  You have trouble breathing (not caused by a stuffy nose). MAKE SURE YOU:   Understand these instructions.  Will watch your condition.  Will get help right away if you are not doing well or get worse. Document Released: 08/31/2005 Document Revised: 06/21/2013 Document Reviewed: 05/08/2013 [facility] Patient Information 2015 [place], [place]. This information is not intended to replace advice given to you by your health care provider. Make sure you discuss any questions you have with your health care provider.

## 2014-09-21 NOTE — Progress Notes (Signed)
Subjective: 81 year old lady who lives at [facility].  She has had a pharyngitis for several days. It started with a sore throat. She has little cough now but still has a sore throat. Does not have much head congestion. She's not had fever. She is married and her husband has not gotten it yet.  Objective: Her TMs are normal. Throat erythematous without exudate right now. She had seen some mucus. Her neck was supple without significant nodes. Chest is clear to auscultation. Heart regular without murmurs.  Assessment: Pharyngitis Cough  Plan: Strep test  and decide treatment   Results for orders placed or performed in visit on 09/21/14  POCT rapid strep A  Result Value Ref Range   Rapid Strep A Screen Negative Negative   Symptomatic treatment.

## 2014-09-21 NOTE — Addendum Note (Signed)
Addended by: Madie Reno on: 09/21/2014 04:25 PM   Modules accepted: Level of Service

## 2014-09-23 LAB — CULTURE, GROUP A STREP

## 2014-12-05 ENCOUNTER — Other Ambulatory Visit (HOSPITAL_COMMUNITY): Payer: Self-pay | Admitting: Internal Medicine

## 2014-12-05 ENCOUNTER — Ambulatory Visit (HOSPITAL_COMMUNITY)
Admission: RE | Admit: 2014-12-05 | Discharge: 2014-12-05 | Disposition: A | Discharge status: Home / Self Care | Payer: Medicare Other | Source: Ambulatory Visit | Attending: Internal Medicine | Admitting: Internal Medicine

## 2014-12-05 DIAGNOSIS — M810 Age-related osteoporosis without current pathological fracture: Secondary | ICD-10-CM | POA: Diagnosis not present

## 2014-12-05 MED ORDER — DENOSUMAB 60 MG/ML ~~LOC~~ SOLN
60.0000 mg | Freq: Once | SUBCUTANEOUS | Status: AC
  Administered 2014-12-05: 60 mg via SUBCUTANEOUS
  Filled 2014-12-05: qty 1

## 2014-12-05 NOTE — Progress Notes (Signed)
Uneventful 1st injection of PROLIA. Pt was observed 15 minutes post injection then escorted ambulatory by staff to  Exit where she is to meet her husband at the car

## 2014-12-05 NOTE — Discharge Instructions (Signed)
PROLIA °Denosumab injection °What is this medicine? °DENOSUMAB (den oh sue mab) slows bone breakdown. Prolia is used to treat osteoporosis in women after menopause and in men. Xgeva is used to prevent bone fractures and other bone problems caused by cancer bone metastases. Xgeva is also used to treat giant cell tumor of the bone. °This medicine may be used for other purposes; ask your health care provider or pharmacist if you have questions. °COMMON BRAND NAME(S): Prolia, XGEVA °What should I tell my health care provider before I take this medicine? °They need to know if you have any of these conditions: °-dental disease °-eczema °-infection or history of infections °-kidney disease or on dialysis °-low blood calcium or vitamin D °-malabsorption syndrome °-scheduled to have surgery or tooth extraction °-taking medicine that contains denosumab °-thyroid or parathyroid disease °-an unusual reaction to denosumab, other medicines, foods, dyes, or preservatives °-pregnant or trying to get pregnant °-breast-feeding °How should I use this medicine? °This medicine is for injection under the skin. It is given by a health care professional in a hospital or clinic setting. °If you are getting Prolia, a special MedGuide will be given to you by the pharmacist with each prescription and refill. Be sure to read this information carefully each time. °For Prolia, talk to your pediatrician regarding the use of this medicine in children. Special care may be needed. For Xgeva, talk to your pediatrician regarding the use of this medicine in children. While this drug may be prescribed for children as young as 13 years for selected conditions, precautions do apply. °Overdosage: If you think you've taken too much of this medicine contact a poison control center or emergency room at once. °Overdosage: If you think you have taken too much of this medicine contact a poison control center or emergency room at once. °NOTE: This medicine is only  for you. Do not share this medicine with others. °What if I miss a dose? °It is important not to miss your dose. Call your doctor or health care professional if you are unable to keep an appointment. °What may interact with this medicine? °Do not take this medicine with any of the following medications: °-other medicines containing denosumab °This medicine may also interact with the following medications: °-medicines that suppress the immune system °-medicines that treat cancer °-steroid medicines like prednisone or cortisone °This list may not describe all possible interactions. Give your health care provider a list of all the medicines, herbs, non-prescription drugs, or dietary supplements you use. Also tell them if you smoke, drink alcohol, or use illegal drugs. Some items may interact with your medicine. °What should I watch for while using this medicine? °Visit your doctor or health care professional for regular checks on your progress. Your doctor or health care professional may order blood tests and other tests to see how you are doing. °Call your doctor or health care professional if you get a cold or other infection while receiving this medicine. Do not treat yourself. This medicine may decrease your body's ability to fight infection. °You should make sure you get enough calcium and vitamin D while you are taking this medicine, unless your doctor tells you not to. Discuss the foods you eat and the vitamins you take with your health care professional. °See your dentist regularly. Brush and floss your teeth as directed. Before you have any dental work done, tell your dentist you are receiving this medicine. °Do not become pregnant while taking this medicine or for 5 months after   stopping it. Women should inform their doctor if they wish to become pregnant or think they might be pregnant. There is a potential for serious side effects to an unborn child. Talk to your health care professional or pharmacist for  more information. °What side effects may I notice from receiving this medicine? °Side effects that you should report to your doctor or health care professional as soon as possible: °-allergic reactions like skin rash, itching or hives, swelling of the face, lips, or tongue °-breathing problems °-chest pain °-fast, irregular heartbeat °-feeling faint or lightheaded, falls °-fever, chills, or any other sign of infection °-muscle spasms, tightening, or twitches °-numbness or tingling °-skin blisters or bumps, or is dry, peels, or red °-slow healing or unexplained pain in the mouth or jaw °-unusual bleeding or bruising °Side effects that usually do not require medical attention (Report these to your doctor or health care professional if they continue or are bothersome.): °-muscle pain °-stomach upset, gas °This list may not describe all possible side effects. Call your doctor for medical advice about side effects. You may report side effects to FDA at 1-800-FDA-1088. °Where should I keep my medicine? °This medicine is only given in a clinic, doctor's office, or other health care setting and will not be stored at home. °NOTE: This sheet is a summary. It may not cover all possible information. If you have questions about this medicine, talk to your doctor, pharmacist, or health care provider. °© 2015, [employer]. (2012-02-29 12:37:47) °Osteoporosis °Throughout your life, your body breaks down old bone and replaces it with new bone. As you get older, your body does not replace bone as quickly as it breaks it down. By the age of 30 years, most people begin to gradually lose bone because of the imbalance between bone loss and replacement. Some people lose more bone than others. Bone loss beyond a specified normal degree is considered osteoporosis.  °Osteoporosis affects the strength and durability of your bones. The inside of the ends of your bones and your flat bones, like the bones of your pelvis, look like  honeycomb, filled with tiny open spaces. As bone loss occurs, your bones become less dense. This means that the open spaces inside your bones become bigger and the walls between these spaces become thinner. This makes your bones weaker. Bones of a person with osteoporosis can become so weak that they can break (fracture) during minor accidents, such as a simple fall. °CAUSES  °The following factors have been associated with the development of osteoporosis: °· Smoking. °· Drinking more than 2 alcoholic drinks several days per week. °· Long-term use of certain medicines: °¨ Corticosteroids. °¨ Chemotherapy medicines. °¨ Thyroid medicines. °¨ Antiepileptic medicines. °¨ Gonadal hormone suppression medicine. °¨ Immunosuppression medicine. °· Being underweight. °· Lack of physical activity. °· Lack of exposure to the sun. This can lead to vitamin D deficiency. °· Certain medical conditions: °¨ Certain inflammatory bowel diseases, such as Crohn disease and ulcerative colitis. °¨ Diabetes. °¨ Hyperthyroidism. °¨ Hyperparathyroidism. °RISK FACTORS °Anyone can develop osteoporosis. However, the following factors can increase your risk of developing osteoporosis: °· Gender--Women are at higher risk than men. °· Age--Being older than 50 years increases your risk. °· Ethnicity--White and Asian people have an increased risk. °· Weight --Being extremely underweight can increase your risk of osteoporosis. °· Family history of osteoporosis--Having a family member who has developed osteoporosis can increase your risk. °SYMPTOMS  °Usually, people with osteoporosis have no symptoms.  °DIAGNOSIS  °Signs during   a physical exam that may prompt your caregiver to suspect osteoporosis include: °· Decreased height. This is usually caused by the compression of the bones that form your spine (vertebrae) because they have weakened and become fractured. °· A curving or rounding of the upper back (kyphosis). °To confirm signs of osteoporosis,  your caregiver may request a procedure that uses 2 low-dose X-ray beams with different levels of energy to measure your bone mineral density (dual-energy X-ray absorptiometry [DXA]). Also, your caregiver may check your level of vitamin D. °TREATMENT  °The goal of osteoporosis treatment is to strengthen bones in order to decrease the risk of bone fractures. There are different types of medicines available to help achieve this goal. Some of these medicines work by slowing the processes of bone loss. Some medicines work by increasing bone density. Treatment also involves making sure that your levels of calcium and vitamin D are adequate. °PREVENTION  °There are things you can do to help prevent osteoporosis. Adequate intake of calcium and vitamin D can help you achieve optimal bone mineral density. Regular exercise can also help, especially resistance and weight-bearing activities. If you smoke, quitting smoking is an important part of osteoporosis prevention. °MAKE SURE YOU: °· Understand these instructions. °· Will watch your condition. °· Will get help right away if you are not doing well or get worse. °FOR MORE INFORMATION °www.osteo.org and www.nof.org °Document Released: 06/10/2005 Document Revised: 12/26/2012 Document Reviewed: 08/15/2011 °Exit[employer]® Patient Information ©2015 [employer]. This information is not intended to replace advice given to you by your health care provider. Make sure you discuss any questions you have with your health care provider. ° ° °

## 2014-12-06 ENCOUNTER — Encounter: Payer: Self-pay | Admitting: Physician Assistant

## 2014-12-21 ENCOUNTER — Encounter: Payer: Self-pay | Admitting: Gastroenterology

## 2014-12-26 ENCOUNTER — Ambulatory Visit (INDEPENDENT_AMBULATORY_CARE_PROVIDER_SITE_OTHER): Payer: Medicare Other | Admitting: Physician Assistant

## 2014-12-26 ENCOUNTER — Encounter: Payer: Self-pay | Admitting: Physician Assistant

## 2014-12-26 VITALS — BP 140/82 | HR 80 | Ht 61.5 in | Wt 112.4 lb

## 2014-12-26 DIAGNOSIS — M6289 Other specified disorders of muscle: Secondary | ICD-10-CM

## 2014-12-26 DIAGNOSIS — R159 Full incontinence of feces: Secondary | ICD-10-CM | POA: Diagnosis not present

## 2014-12-26 DIAGNOSIS — N8184 Pelvic muscle wasting: Secondary | ICD-10-CM

## 2014-12-26 NOTE — Patient Instructions (Signed)
You have been scheduled to see Christina Escobar, PT at [facility] for evaluation/treatment of pelvic floor dysfunction. Your appointment is on Monday, 12/31/14 @ 11:00 am. Please arrive at 10:45 am for registration. Make certain to bring all medications as well as insurance cards and any copay you may have. This appointment will last about 1 hour.  Their information is as follows: Address: [address], [place], [place] [address]  Phone: [phone]  Please follow up with Dr Ardis Hughs on Tuesday, 03/05/15 @ 2:15 pm. Unfortunately, this is the only date available at this time. If this date does not work, please call our office in 1-2 weeks to see if additional appointments are available.

## 2014-12-27 ENCOUNTER — Encounter: Payer: Self-pay | Admitting: Physician Assistant

## 2014-12-27 NOTE — Progress Notes (Signed)
Patient ID: Christina Escobar, female   DOB: [birth year], 81 y.o.   MRN: [id number]    HPI:  Christina Escobar is a 81 y.o.   female referred by Drenda Freeze, MD for evaluation of incontinence.  [name] underwent a sigmoid colectomy and incidental appendectomy in July 2001 due to sigmoid diverticulitis with stenosis. She had a colonoscopy in February 2007 by Dr. Timmothy Euler that was a normal colonoscopy to the cecum status post sigmoid resection. In 2014 she was evaluated by Dr. Paulita Fujita for hemorrhoids. She had a colonoscopy by Dr. Paulita Fujita in March 2014. She was noted to have benign colonic mucosa and biopsies were negative for microscopic colitis, active inflammation, granulomas or adenomatous changes. A few medium mouth diverticuli were found in the sigmoid and in the descending colon. She was advised to use fiber but states it has not helped her symptoms. She reports that her stools alternate between formed and loose but are more often formed her problem is it takes a long time to get the stool out. She has to sit on the toilet and sometimes strain and occasionally has scant bloody spotting on the toilet tissue. She has no associated rectal pain, itching or burning but she does have a sensation of incomplete evacuation. She has occasional episodes when stool will slip out without her noticing. She has not had to apply pressure to the perineal area to help a stool come out. She does also have some intermittent urinary dribbling. She has been instructed to use Mira lax in the past and feels it does not help. She was sent for anorectal manometry that she had performed at [facility]. 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 She was evaluated by Dr. Marcello Moores in April 2014 he was presented with the options of continued medical management with increased fiber, biofeedback therapy, or rectal ultrasound to evaluate for the need  injection and sacral nerve stimulators. The patient did not pursue any of these options and presents for further evaluation today.  Past medical history includes hypertension, thrombosed hemorrhoids, cataracts, migraines, IBS, 2 vaginal deliveries, colon resection, cataract surgery and incision and drainage of a thrombosed hemorrhoid.   Past Medical History  Diagnosis Date  . Hypertension   . Thrombosed hemorrhoids   . Cataracts, bilateral   . Migraine   . IBS (irritable bowel syndrome)   . Basal cell carcinoma   . Anxiety   . Depression   . Osteopenia   . Diverticulosis     hx of stricture in colon  . Hemorrhoids   . Insomnia   . Allergy   . Anal fissure     Past Surgical History  Procedure Laterality Date  . Cataract extraction, bilateral Bilateral 1999  . Colon resection  2001  . Incision and drainage  11/05/2011    thrombosed hemorrhoid  . Transanal excision of rectal mass with hemorrhoid injection  2007    anal fissure  . Basal cell carcinoma excision  [id number]  . Appendectomy     Family History  Problem Relation Age of Onset  . Alzheimer's disease Mother   . Heart disease Father   . Heart failure Father   . Heart failure Mother   . Other Maternal Grandfather     cerebral hemorrhage  . Skin cancer Brother   . Heart disease Brother    History  Substance Use Topics  . Smoking status:  Never Smoker   . Smokeless tobacco: Never Used  . Alcohol Use: 0.0 oz/week    0 Standard drinks or equivalent per week     Comment: occasional glass of wine -2 per week   Current Outpatient Prescriptions  Medication Sig Dispense Refill  . acetaminophen (TYLENOL) 500 MG tablet Take 500 mg by mouth as needed.    Marland Kitchen amLODipine (NORVASC) 2.5 MG tablet Take 2.5 mg by mouth daily.    . Calcium Carbonate-Vitamin D (CALTRATE 600+D PO) Take by mouth 2 (two) times daily.    . fluticasone (FLONASE) 50 MCG/ACT nasal spray Place 1 spray into both nostrils as needed.     Marland Kitchen  HYDROcodone-acetaminophen (NORCO/VICODIN) 5-325 MG per tablet Take 1-2 tablets by mouth every 6 (six) hours as needed. 15 tablet 0  . Loperamide HCl (IMODIUM PO) Take by mouth as needed.    . loratadine (CLARITIN) 10 MG tablet Take 10 mg by mouth daily.    Marland Kitchen LORazepam (ATIVAN) 0.5 MG tablet Take 2.5 mg by mouth Daily.     . mirtazapine (REMERON) 45 MG tablet Daily.    . Multiple Vitamins-Minerals (CENTRUM SILVER PO) Take by mouth daily.    . polyethylene glycol powder (GLYCOLAX/MIRALAX) powder Take 8.5 g by mouth daily.     . psyllium (METAMUCIL) 58.6 % packet Take 0.5 packets by mouth daily.     . quinapril (ACCUPRIL) 40 MG tablet Daily.    . SUMAtriptan (IMITREX) 25 MG tablet Take 25 mg by mouth as needed.    . zolpidem (AMBIEN) 10 MG tablet as needed.      No current facility-administered medications for this visit.   Allergies  Allergen Reactions  . Erythromycin Nausea And Vomiting  . Flagyl [Metronidazole]      Review of Systems: As noted in history of present illness, otherwise negative.   Prior Endoscopies:  Colonoscopy 11/11/2005 by Dr. Timmothy Euler was a normal colonoscopy to the cecum status post sigmoid resection. Colonoscopy on 11/29/2012 by Dr. Paulita Fujita revealed normal perianal examination. A few medium mouth diverticula were found in the sigmoid colon and in the descending colon. The colon appeared normal biopsies were taken and were negative for microscopic colitis. A retroflexed view of the distal rectum and anal verge was normal and showed no anal or rectal abnormalities.    Physical Exam: BP 140/82 mmHg  Pulse 80  Ht 5' 1.5" (1.562 m)  Wt 112 lb 6 oz (50.973 kg)  BMI 20.89 kg/m2 Constitutional: Pleasant,well-developed, elderly female in no acute distress. HEENT: Normocephalic and atraumatic. Conjunctivae are normal. No scleral icterus. Neck supple.  Cardiovascular: Normal rate, regular rhythm.  Pulmonary/chest: Effort normal and breath sounds normal. No wheezing,  rales or rhonchi. Abdominal: Soft, nondistended, nontender. Bowel sounds active throughout. There are no masses palpable. No hepatomegaly. Extremities: no edema Rectal: Brown stool tests heme negative, anal wink  presents, poor sphincter squeeze tone. Lymphadenopathy: No cervical adenopathy noted. Neurological: Alert and oriented to person place and time. Skin: Skin is warm and dry. No rashes noted. Psychiatric: Normal mood and affect. Behavior is normal.  ASSESSMENT AND PLAN: 81 year old female with a history of IBS experiencing some fecal incontinence likely due to pelvic floor dysfunction. Patient will be scheduled for evaluation by Ileana Roup for evaluation and treatment of pelvic floor dysfunction either by biofeedback or tibial nerve stimulation.. Patient will follow up with Dr. Ardis Hughs in 2 months.    Jaanvi Fizer, Deloris Ping 12/27/2014, 9:46 PM  CC: Drenda Freeze, MD

## 2014-12-28 NOTE — Progress Notes (Signed)
i agree with the above note, plan 

## 2015-01-01 ENCOUNTER — Telehealth: Payer: Self-pay | Admitting: Physician Assistant

## 2015-01-01 NOTE — Telephone Encounter (Signed)
Left message for patient to call back  

## 2015-01-01 NOTE — Telephone Encounter (Signed)
I have spoken to patient and have advised her of Lori's recommendations. She verbalizes understanding.

## 2015-01-01 NOTE — Telephone Encounter (Signed)
sHE CAN USE CALMOSEPTINE TO THE PERIRECTAL AREA FOR ANY IRRITATION. [employer] INSURANCE DOESN'T COVER IT, A THIN LAYER OF DESITIN DIAPER RASH OINTMENT TO AREA WILL PREVENT BREAKDOWN FROM STOOL.

## 2015-01-01 NOTE — Telephone Encounter (Signed)
Cecille Rubin, was there a medication that you wanted to be sent to the pharmacy for patient? I dont see any mention of it in your note or patient instructions but patient is calling for hemorrhoidal medication????

## 2015-03-05 ENCOUNTER — Ambulatory Visit: Payer: Medicare Other | Admitting: Gastroenterology

## 2015-03-19 ENCOUNTER — Encounter: Payer: Self-pay | Admitting: Gastroenterology

## 2015-03-19 ENCOUNTER — Ambulatory Visit (INDEPENDENT_AMBULATORY_CARE_PROVIDER_SITE_OTHER): Payer: Medicare Other | Admitting: Gastroenterology

## 2015-03-19 VITALS — BP 140/76 | HR 80 | Ht 61.5 in | Wt 109.0 lb

## 2015-03-19 DIAGNOSIS — R159 Full incontinence of feces: Secondary | ICD-10-CM | POA: Diagnosis not present

## 2015-03-19 NOTE — Progress Notes (Signed)
HPI: This is a very pleasant 81 yo woman whom i am meeting for the first time today.  She was in our office and saw Cecille Rubin about 3 months ago  Chief complaint is fecal incontinence  Porsche underwent a sigmoid colectomy and incidental appendectomy in July 2001 due to sigmoid diverticulitis with stenosis. She had a colonoscopy in February 2007 by Dr. Timmothy Euler that was a normal colonoscopy to the cecum status post sigmoid resection. In 2014 she was evaluated by Dr. Paulita Fujita for hemorrhoids. She had a colonoscopy by Dr. Paulita Fujita in March 2014. She was noted to have benign colonic mucosa and biopsies were negative for microscopic colitis, active inflammation, granulomas or adenomatous changes. A few medium mouth diverticuli were found in the sigmoid and in the descending colon. She was advised to use fiber but states it has not helped her symptoms. She reports that her stools alternate between formed and loose but are more often formed her problem is it takes a long time to get the stool out. She has to sit on the toilet and sometimes strain and occasionally has scant bloody spotting on the toilet tissue. She has no associated rectal pain, itching or burning but she does have a sensation of incomplete evacuation. She has occasional episodes when stool will slip out without her noticing. She has not had to apply pressure to the perineal area to help a stool come out. She does also have some intermittent urinary dribbling. She has been instructed to use Mira lax in the past and feels it does not help. She was sent for anorectal manometry that she had performed at [facility]. 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 She was evaluated by Dr. Marcello Moores in April 2014 he was presented with the options of continued medical management with increased fiber, biofeedback therapy, or rectal ultrasound to evaluate for the need injection  and sacral nerve stimulators. The patient did not pursue any of these options and presents for further evaluation today.  She was seen here in our office 2-3 months ago and was referred for biofeedback.  Biofeedback appts went somewhat well. She believes she made some progress and is going to be seeing them again.  She was told not to sit on toilett more than 5 minutes and they really pushed water.  She is pretty happy about the outcome, kegle exercises at home.      Past Medical History  Diagnosis Date  . Hypertension   . Thrombosed hemorrhoids   . Cataracts, bilateral   . Migraine   . IBS (irritable bowel syndrome)   . Basal cell carcinoma   . Anxiety   . Depression   . Osteopenia   . Diverticulosis     hx of stricture in colon  . Hemorrhoids   . Insomnia   . Allergy   . Anal fissure     Past Surgical History  Procedure Laterality Date  . Cataract extraction, bilateral Bilateral 1999  . Colon resection  2001  . Incision and drainage  11/05/2011    thrombosed hemorrhoid  . Transanal excision of rectal mass with hemorrhoid injection  2007    anal fissure  . Basal cell carcinoma excision  [id number]  . Appendectomy      Current Outpatient Prescriptions  Medication Sig Dispense Refill  . acetaminophen (TYLENOL) 500 MG tablet Take 500 mg by mouth as needed.    Marland Kitchen  amLODipine (NORVASC) 2.5 MG tablet Take 2.5 mg by mouth daily.    . Calcium Carbonate-Vitamin D (CALTRATE 600+D PO) Take by mouth 2 (two) times daily.    . fluticasone (FLONASE) 50 MCG/ACT nasal spray Place 1 spray into both nostrils as needed.     Marland Kitchen HYDROcodone-acetaminophen (NORCO/VICODIN) 5-325 MG per tablet Take 1-2 tablets by mouth every 6 (six) hours as needed. 15 tablet 0  . Loperamide HCl (IMODIUM PO) Take by mouth as needed.    . loratadine (CLARITIN) 10 MG tablet Take 10 mg by mouth daily.    Marland Kitchen LORazepam (ATIVAN) 0.5 MG tablet Take 2.5 mg by mouth Daily.     . mirtazapine (REMERON) 45 MG tablet Take 45 mg  by mouth daily.     . Multiple Vitamins-Minerals (CENTRUM SILVER PO) Take by mouth daily.    . polyethylene glycol powder (GLYCOLAX/MIRALAX) powder Take 8.5 g by mouth daily.     . psyllium (METAMUCIL) 58.6 % packet Take 0.5 packets by mouth daily.     . quinapril (ACCUPRIL) 40 MG tablet Take 40 mg by mouth Daily.     . SUMAtriptan (IMITREX) 25 MG tablet Take 25 mg by mouth as needed.    . zolpidem (AMBIEN) 10 MG tablet Take 10 mg by mouth at bedtime as needed.      No current facility-administered medications for this visit.    Allergies as of 03/19/2015 - Review Complete 03/19/2015  Allergen Reaction Noted  . Erythromycin Nausea And Vomiting 12/05/2014  . Flagyl [metronidazole]  02/28/2013    Family History  Problem Relation Age of Onset  . Alzheimer's disease Mother   . Heart disease Father   . Heart failure Father   . Heart failure Mother   . Other Maternal Grandfather     cerebral hemorrhage  . Skin cancer Brother   . Heart disease Brother     History   Social History  . Marital Status: Married    Spouse Name: Paediatric nurse  . Number of Children: 2  . Years of Education: MA   Occupational History  . retired Pharmacist, hospital    Social History Main Topics  . Smoking status: Never Smoker   . Smokeless tobacco: Never Used  . Alcohol Use: 0.0 oz/week    0 Standard drinks or equivalent per week     Comment: occasional glass of wine -2 per week  . Drug Use: No  . Sexual Activity: Not on file   Other Topics Concern  . Not on file   Social History Narrative   Pt lives at home with her Spouse. She has 3 children, (1 adopted). Spouse is a retired Film/video editor.   Caffeine Use: 1-2 cups daily.     Physical Exam: BP 140/76 mmHg  Pulse 80  Ht 5' 1.5" (1.562 m)  Wt 109 lb (49.442 kg)  BMI 20.26 kg/m2 Constitutional: generally frail appearing Psychiatric: alert and oriented x3 Abdomen: soft, nontender, nondistended, no obvious ascites, no peritoneal signs, normal bowel  sounds Rectal exam with female assistant in the room: poor anal tone, no distal rectal mass, stool was brown, not checked for hemocult, + small external hemorrhoids  Assessment and plan: 81 y.o. female with fecal incontinence, poor anal tone  She has noticed improvement since starting behavioral therapy and really enjoys the visits, interactions with the team.  Her response in incomplete but hopefully will improve further.  Follow up as needed.     Owens Loffler, MD [facility] 03/19/2015, 3:35 PM

## 2015-03-19 NOTE — Patient Instructions (Addendum)
Continue with biofeedback appts since it sounds like they may be helping you. Please call if any other questions or concerns.

## 2015-04-15 ENCOUNTER — Other Ambulatory Visit: Payer: Self-pay

## 2015-04-15 DIAGNOSIS — Z1231 Encounter for screening mammogram for malignant neoplasm of breast: Secondary | ICD-10-CM

## 2015-05-23 ENCOUNTER — Ambulatory Visit
Admission: RE | Admit: 2015-05-23 | Discharge: 2015-05-23 | Disposition: A | Discharge status: Home / Self Care | Payer: Medicare Other | Source: Ambulatory Visit

## 2015-05-23 DIAGNOSIS — Z1231 Encounter for screening mammogram for malignant neoplasm of breast: Secondary | ICD-10-CM

## 2015-05-27 ENCOUNTER — Other Ambulatory Visit: Payer: Self-pay | Admitting: Internal Medicine

## 2015-05-27 DIAGNOSIS — R928 Other abnormal and inconclusive findings on diagnostic imaging of breast: Secondary | ICD-10-CM

## 2015-06-10 ENCOUNTER — Ambulatory Visit
Admission: RE | Admit: 2015-06-10 | Discharge: 2015-06-10 | Disposition: A | Discharge status: Home / Self Care | Payer: Medicare Other | Source: Ambulatory Visit | Attending: Internal Medicine | Admitting: Internal Medicine

## 2015-06-10 DIAGNOSIS — R928 Other abnormal and inconclusive findings on diagnostic imaging of breast: Secondary | ICD-10-CM

## 2015-07-12 ENCOUNTER — Ambulatory Visit (HOSPITAL_COMMUNITY)
Admission: RE | Admit: 2015-07-12 | Discharge: 2015-07-12 | Disposition: A | Discharge status: Home / Self Care | Payer: Medicare Other | Source: Ambulatory Visit | Attending: Internal Medicine | Admitting: Internal Medicine

## 2015-07-12 ENCOUNTER — Encounter (HOSPITAL_COMMUNITY): Payer: Self-pay

## 2015-07-12 ENCOUNTER — Other Ambulatory Visit (HOSPITAL_COMMUNITY): Payer: Self-pay | Admitting: Internal Medicine

## 2015-07-12 DIAGNOSIS — M810 Age-related osteoporosis without current pathological fracture: Secondary | ICD-10-CM | POA: Insufficient documentation

## 2015-07-12 MED ORDER — DENOSUMAB 60 MG/ML ~~LOC~~ SOLN
60.0000 mg | Freq: Once | SUBCUTANEOUS | Status: AC
  Administered 2015-07-12: 60 mg via SUBCUTANEOUS
  Filled 2015-07-12: qty 1

## 2015-07-12 NOTE — Discharge Instructions (Signed)
Prolia °Denosumab injection °What is this medicine? °DENOSUMAB (den oh sue mab) slows bone breakdown. Prolia is used to treat osteoporosis in women after menopause and in men. Xgeva is used to prevent bone fractures and other bone problems caused by cancer bone metastases. Xgeva is also used to treat giant cell tumor of the bone. °This medicine may be used for other purposes; ask your health care provider or pharmacist if you have questions. °What should I tell my health care provider before I take this medicine? °They need to know if you have any of these conditions: °-dental disease °-eczema °-infection or history of infections °-kidney disease or on dialysis °-low blood calcium or vitamin D °-malabsorption syndrome °-scheduled to have surgery or tooth extraction °-taking medicine that contains denosumab °-thyroid or parathyroid disease °-an unusual reaction to denosumab, other medicines, foods, dyes, or preservatives °-pregnant or trying to get pregnant °-breast-feeding °How should I use this medicine? °This medicine is for injection under the skin. It is given by a health care professional in a hospital or clinic setting. °If you are getting Prolia, a special MedGuide will be given to you by the pharmacist with each prescription and refill. Be sure to read this information carefully each time. °For Prolia, talk to your pediatrician regarding the use of this medicine in children. Special care may be needed. For Xgeva, talk to your pediatrician regarding the use of this medicine in children. While this drug may be prescribed for children as young as 13 years for selected conditions, precautions do apply. °Overdosage: If you think you have taken too much of this medicine contact a poison control center or emergency room at once. °NOTE: This medicine is only for you. Do not share this medicine with others. °What if I miss a dose? °It is important not to miss your dose. Call your doctor or health care professional  if you are unable to keep an appointment. °What may interact with this medicine? °Do not take this medicine with any of the following medications: °-other medicines containing denosumab °This medicine may also interact with the following medications: °-medicines that suppress the immune system °-medicines that treat cancer °-steroid medicines like prednisone or cortisone °This list may not describe all possible interactions. Give your health care provider a list of all the medicines, herbs, non-prescription drugs, or dietary supplements you use. Also tell them if you smoke, drink alcohol, or use illegal drugs. Some items may interact with your medicine. °What should I watch for while using this medicine? °Visit your doctor or health care professional for regular checks on your progress. Your doctor or health care professional may order blood tests and other tests to see how you are doing. °Call your doctor or health care professional if you get a cold or other infection while receiving this medicine. Do not treat yourself. This medicine may decrease your body's ability to fight infection. °You should make sure you get enough calcium and vitamin D while you are taking this medicine, unless your doctor tells you not to. Discuss the foods you eat and the vitamins you take with your health care professional. °See your dentist regularly. Brush and floss your teeth as directed. Before you have any dental work done, tell your dentist you are receiving this medicine. °Do not become pregnant while taking this medicine or for 5 months after stopping it. Women should inform their doctor if they wish to become pregnant or think they might be pregnant. There is a potential for serious side   effects to an unborn child. Talk to your health care professional or pharmacist for more information. °What side effects may I notice from receiving this medicine? °Side effects that you should report to your doctor or health care professional  as soon as possible: °-allergic reactions like skin rash, itching or hives, swelling of the face, lips, or tongue °-breathing problems °-chest pain °-fast, irregular heartbeat °-feeling faint or lightheaded, falls °-fever, chills, or any other sign of infection °-muscle spasms, tightening, or twitches °-numbness or tingling °-skin blisters or bumps, or is dry, peels, or red °-slow healing or unexplained pain in the mouth or jaw °-unusual bleeding or bruising °Side effects that usually do not require medical attention (Report these to your doctor or health care professional if they continue or are bothersome.): °-muscle pain °-stomach upset, gas °This list may not describe all possible side effects. Call your doctor for medical advice about side effects. You may report side effects to FDA at 1-800-FDA-1088. °Where should I keep my medicine? °This medicine is only given in a clinic, doctor's office, or other health care setting and will not be stored at home. °NOTE: This sheet is a summary. It may not cover all possible information. If you have questions about this medicine, talk to your doctor, pharmacist, or health care provider. °  °© 2016, [employer]. (2012-02-29 12:37:47) ° °

## 2015-12-18 ENCOUNTER — Other Ambulatory Visit (HOSPITAL_COMMUNITY): Payer: Self-pay | Admitting: *Deleted

## 2015-12-19 ENCOUNTER — Ambulatory Visit (HOSPITAL_COMMUNITY)
Admission: RE | Admit: 2015-12-19 | Discharge: 2015-12-19 | Disposition: A | Discharge status: Home / Self Care | Payer: Medicare Other | Source: Ambulatory Visit | Attending: Internal Medicine | Admitting: Internal Medicine

## 2015-12-19 DIAGNOSIS — M810 Age-related osteoporosis without current pathological fracture: Secondary | ICD-10-CM | POA: Insufficient documentation

## 2015-12-19 MED ORDER — DENOSUMAB 60 MG/ML ~~LOC~~ SOLN
60.0000 mg | Freq: Once | SUBCUTANEOUS | Status: AC
  Administered 2015-12-19: 60 mg via SUBCUTANEOUS
  Filled 2015-12-19: qty 1

## 2016-01-21 ENCOUNTER — Ambulatory Visit (HOSPITAL_COMMUNITY)
Admission: RE | Admit: 2016-01-21 | Discharge: 2016-01-21 | Disposition: A | Discharge status: Home / Self Care | Payer: Medicare Other | Source: Ambulatory Visit | Attending: Internal Medicine | Admitting: Internal Medicine

## 2016-03-25 ENCOUNTER — Encounter (HOSPITAL_COMMUNITY): Payer: Self-pay

## 2016-03-25 ENCOUNTER — Emergency Department (HOSPITAL_COMMUNITY): Payer: Medicare Other

## 2016-03-25 ENCOUNTER — Observation Stay (HOSPITAL_COMMUNITY)
Admission: EM | Admit: 2016-03-25 | Discharge: 2016-03-28 | Disposition: A | Discharge status: Home / Self Care | Payer: Medicare Other | Attending: Internal Medicine | Admitting: Internal Medicine

## 2016-03-25 DIAGNOSIS — Z79899 Other long term (current) drug therapy: Secondary | ICD-10-CM | POA: Insufficient documentation

## 2016-03-25 DIAGNOSIS — F419 Anxiety disorder, unspecified: Secondary | ICD-10-CM | POA: Diagnosis not present

## 2016-03-25 DIAGNOSIS — J329 Chronic sinusitis, unspecified: Secondary | ICD-10-CM | POA: Insufficient documentation

## 2016-03-25 DIAGNOSIS — Z7951 Long term (current) use of inhaled steroids: Secondary | ICD-10-CM | POA: Diagnosis not present

## 2016-03-25 DIAGNOSIS — K521 Toxic gastroenteritis and colitis: Secondary | ICD-10-CM | POA: Diagnosis present

## 2016-03-25 DIAGNOSIS — A498 Other bacterial infections of unspecified site: Secondary | ICD-10-CM | POA: Diagnosis present

## 2016-03-25 DIAGNOSIS — K529 Noninfective gastroenteritis and colitis, unspecified: Secondary | ICD-10-CM

## 2016-03-25 DIAGNOSIS — I10 Essential (primary) hypertension: Secondary | ICD-10-CM | POA: Insufficient documentation

## 2016-03-25 DIAGNOSIS — A047 Enterocolitis due to Clostridium difficile: Secondary | ICD-10-CM | POA: Diagnosis not present

## 2016-03-25 DIAGNOSIS — T3695XA Adverse effect of unspecified systemic antibiotic, initial encounter: Secondary | ICD-10-CM

## 2016-03-25 DIAGNOSIS — G43909 Migraine, unspecified, not intractable, without status migrainosus: Secondary | ICD-10-CM | POA: Diagnosis not present

## 2016-03-25 DIAGNOSIS — R197 Diarrhea, unspecified: Secondary | ICD-10-CM | POA: Diagnosis present

## 2016-03-25 LAB — COMPREHENSIVE METABOLIC PANEL
ALBUMIN: 4.5 g/dL (ref 3.5–5.0)
ALK PHOS: 74 U/L (ref 38–126)
ALT: 21 U/L (ref 14–54)
ANION GAP: 9 (ref 5–15)
AST: 28 U/L (ref 15–41)
BUN: 13 mg/dL (ref 6–20)
CHLORIDE: 103 mmol/L (ref 101–111)
CO2: 23 mmol/L (ref 22–32)
Calcium: 9.3 mg/dL (ref 8.9–10.3)
Creatinine, Ser: 0.87 mg/dL (ref 0.44–1.00)
GFR calc Af Amer: >60 mL/min (ref >60)
GFR calc non Af Amer: 60 mL/min — ABNORMAL LOW (ref >60)
GLUCOSE: 130 mg/dL — AB (ref 65–99)
POTASSIUM: 3.9 mmol/L (ref 3.5–5.1)
SODIUM: 135 mmol/L (ref 135–145)
TOTAL PROTEIN: 7.9 g/dL (ref 6.5–8.1)
Total Bilirubin: 0.7 mg/dL (ref 0.3–1.2)

## 2016-03-25 LAB — URINALYSIS, ROUTINE W REFLEX MICROSCOPIC
Bilirubin Urine: NEGATIVE
GLUCOSE, UA: NEGATIVE mg/dL
Hgb urine dipstick: NEGATIVE
Ketones, ur: 15 mg/dL — AB
LEUKOCYTES UA: NEGATIVE
NITRITE: NEGATIVE
Protein, ur: NEGATIVE mg/dL
Specific Gravity, Urine: 1.017 (ref 1.005–1.030)
pH: 5 (ref 5.0–8.0)

## 2016-03-25 LAB — LIPASE, BLOOD: LIPASE: 28 U/L (ref 11–51)

## 2016-03-25 LAB — CBC
HCT: 38.1 % (ref 36.0–46.0)
Hemoglobin: 12.9 g/dL (ref 12.0–15.0)
MCH: 30.9 pg (ref 26.0–34.0)
MCHC: 33.9 g/dL (ref 30.0–36.0)
MCV: 91.1 fL (ref 78.0–100.0)
Platelets: 325 10*3/uL (ref 150–400)
RBC: 4.18 MIL/uL (ref 3.87–5.11)
RDW: 12.3 % (ref 11.5–15.5)
WBC: 20.8 10*3/uL — AB (ref 4.0–10.5)

## 2016-03-25 MED ORDER — ACETAMINOPHEN 500 MG PO TABS
1000.0000 mg | ORAL_TABLET | ORAL | Status: DC | PRN
  Administered 2016-03-26 (×2): 1000 mg via ORAL
  Filled 2016-03-25 (×3): qty 2

## 2016-03-25 MED ORDER — ONDANSETRON HCL 4 MG/2ML IJ SOLN
4.0000 mg | Freq: Four times a day (QID) | INTRAMUSCULAR | Status: DC | PRN
  Administered 2016-03-26 – 2016-03-27 (×3): 4 mg via INTRAVENOUS
  Filled 2016-03-25 (×3): qty 2

## 2016-03-25 MED ORDER — AMLODIPINE BESYLATE 5 MG PO TABS
5.0000 mg | ORAL_TABLET | Freq: Every day | ORAL | Status: DC
  Administered 2016-03-26 – 2016-03-28 (×3): 5 mg via ORAL
  Filled 2016-03-25 (×3): qty 1

## 2016-03-25 MED ORDER — LOPERAMIDE HCL 2 MG PO CAPS: 2.0000 mg | ORAL_CAPSULE | Freq: Four times a day (QID) | ORAL | Status: DC | PRN

## 2016-03-25 MED ORDER — KETOROLAC TROMETHAMINE 30 MG/ML IJ SOLN
15.0000 mg | Freq: Once | INTRAMUSCULAR | Status: AC
  Administered 2016-03-25: 15 mg via INTRAVENOUS
  Filled 2016-03-25: qty 1

## 2016-03-25 MED ORDER — FLUTICASONE PROPIONATE 50 MCG/ACT NA SUSP
1.0000 | Freq: Every day | NASAL | Status: DC
  Filled 2016-03-25: qty 16

## 2016-03-25 MED ORDER — SODIUM CHLORIDE 0.9 % IV SOLN
INTRAVENOUS | Status: DC
  Administered 2016-03-26 (×2): via INTRAVENOUS

## 2016-03-25 MED ORDER — SODIUM CHLORIDE 0.9 % IV SOLN
INTRAVENOUS | Status: DC
  Administered 2016-03-25 – 2016-03-28 (×3): via INTRAVENOUS

## 2016-03-25 MED ORDER — IOPAMIDOL (ISOVUE-300) INJECTION 61%
100.0000 mL | Freq: Once | INTRAVENOUS | Status: AC | PRN
  Administered 2016-03-25: 100 mL via INTRAVENOUS

## 2016-03-25 MED ORDER — QUINAPRIL HCL 10 MG PO TABS: 40.0000 mg | ORAL_TABLET | Freq: Every day | ORAL | Status: DC

## 2016-03-25 MED ORDER — MIRTAZAPINE 15 MG PO TABS
45.0000 mg | ORAL_TABLET | Freq: Every day | ORAL | Status: DC
  Administered 2016-03-26 – 2016-03-27 (×3): 45 mg via ORAL
  Filled 2016-03-25 (×3): qty 3
  Filled 2016-03-25: qty 2

## 2016-03-25 MED ORDER — LORAZEPAM 1 MG PO TABS
1.0000 mg | ORAL_TABLET | Freq: Two times a day (BID) | ORAL | Status: DC
  Administered 2016-03-25 – 2016-03-28 (×6): 1 mg via ORAL
  Filled 2016-03-25 (×6): qty 1

## 2016-03-25 MED ORDER — SODIUM CHLORIDE 0.9 % IV BOLUS (SEPSIS)
500.0000 mL | Freq: Once | INTRAVENOUS | Status: AC
  Administered 2016-03-25: 500 mL via INTRAVENOUS

## 2016-03-25 MED ORDER — ENOXAPARIN SODIUM 40 MG/0.4ML ~~LOC~~ SOLN
40.0000 mg | SUBCUTANEOUS | Status: DC
  Administered 2016-03-26 – 2016-03-28 (×3): 40 mg via SUBCUTANEOUS
  Filled 2016-03-25 (×3): qty 0.4

## 2016-03-25 MED ORDER — ZOLPIDEM TARTRATE 5 MG PO TABS
5.0000 mg | ORAL_TABLET | Freq: Every evening | ORAL | Status: DC | PRN
  Administered 2016-03-26 – 2016-03-27 (×3): 5 mg via ORAL
  Filled 2016-03-25 (×3): qty 1

## 2016-03-25 NOTE — ED Notes (Signed)
5E called for report but no one answered

## 2016-03-25 NOTE — ED Notes (Signed)
Pt in CT.

## 2016-03-25 NOTE — ED Notes (Signed)
Pt sitting on bedside commode for about 40 minutes; pt states "I just keep on peeing"

## 2016-03-25 NOTE — ED Provider Notes (Signed)
CSN: [id number]     Arrival date & time 03/25/16  1346 History   First MD Initiated Contact with Patient 03/25/16 1803     Chief Complaint  Patient presents with  . Back Pain  . Emesis  . Diarrhea     (Consider location/radiation/quality/duration/timing/severity/associated sxs/prior Treatment) HPI   Christina Escobar is a 82 y.o. female who presents for evaluation of diarrhea, which began today. Diarrhea is dark in color but does not appear bloody to the patient. She is being treated with an oral antibiotic for "a cold". She had chills earlier today and felt like she had a fever. Temperature was 100.2. She denies cough, chest pain, nausea or vomiting. She's been eating well. She did not eat or drink anything since about 9 AM today because she felt it would make her diarrhea worse. No known sick contacts or abnormal food ingestions. There are no other known modifying factors.   Past Medical History  Diagnosis Date  . Hypertension   . Thrombosed hemorrhoids   . Cataracts, bilateral   . Migraine   . IBS (irritable bowel syndrome)   . Basal cell carcinoma   . Anxiety   . Depression   . Osteopenia   . Diverticulosis     hx of stricture in colon  . Hemorrhoids   . Insomnia   . Allergy   . Anal fissure    Past Surgical History  Procedure Laterality Date  . Cataract extraction, bilateral Bilateral 1999  . Colon resection  2001  . Incision and drainage  11/05/2011    thrombosed hemorrhoid  . Transanal excision of rectal mass with hemorrhoid injection  2007    anal fissure  . Basal cell carcinoma excision  [id number]  . Appendectomy     Family History  Problem Relation Age of Onset  . Alzheimer's disease Mother   . Heart disease Father   . Heart failure Father   . Heart failure Mother   . Other Maternal Grandfather     cerebral hemorrhage  . Skin cancer Brother   . Heart disease Brother    Social History  Substance Use Topics  . Smoking status: Never Smoker   .  Smokeless tobacco: Never Used  . Alcohol Use: 0.0 oz/week    0 Standard drinks or equivalent per week     Comment: occasional glass of wine -2 per week   OB History    No data available     Review of Systems  All other systems reviewed and are negative.     Allergies  Erythromycin and Flagyl  Home Medications   Prior to Admission medications   Medication Sig Start Date End Date Taking? Authorizing Provider  acetaminophen (TYLENOL) 500 MG tablet Take 1,000 mg by mouth as needed for moderate pain.    Yes Historical Provider, MD  amLODipine (NORVASC) 5 MG tablet TK 1 T PO QD 02/17/16  Yes Historical Provider, MD  Calcium Carbonate-Vitamin D (CALTRATE 600+D PO) Take 1 tablet by mouth 2 (two) times daily.    Yes Historical Provider, MD  fluticasone (FLONASE) 50 MCG/ACT nasal spray Place 1 spray into both nostrils at bedtime.    Yes Historical Provider, MD  loperamide (IMODIUM A-D) 2 MG tablet Take 2 mg by mouth 4 (four) times daily as needed for diarrhea or loose stools.   Yes Historical Provider, MD  loratadine (CLARITIN) 10 MG tablet Take 10 mg by mouth daily.   Yes Historical Provider, MD  LORazepam (ATIVAN) 1  MG tablet TK 0.5 T PO BID 03/18/16  Yes Historical Provider, MD  mirtazapine (REMERON) 45 MG tablet Take 45 mg by mouth at bedtime.  10/16/11  Yes Historical Provider, MD  Multiple Vitamins-Minerals (CENTRUM SILVER PO) Take 1 tablet by mouth daily.    Yes Historical Provider, MD  polyethylene glycol powder (GLYCOLAX/MIRALAX) powder Take 8.5 g by mouth at bedtime.  10/16/11  Yes Historical Provider, MD  psyllium (METAMUCIL) 58.6 % packet Take 0.5 packets by mouth daily.    Yes Historical Provider, MD  quinapril (ACCUPRIL) 40 MG tablet Take 40 mg by mouth Daily.  10/16/11  Yes Historical Provider, MD  zolpidem (AMBIEN) 10 MG tablet Take 10 mg by mouth at bedtime as needed for sleep.  09/21/11  Yes Historical Provider, MD  HYDROcodone-acetaminophen (NORCO/VICODIN) 5-325 MG per tablet Take 1-2  tablets by mouth every 6 (six) hours as needed. Patient not taking: Reported on 03/25/2016 09/08/14   Glendell Docker, NP  LORazepam (ATIVAN) 0.5 MG tablet Take 0.25 mg by mouth 2 (two) times daily. Reported on 03/25/2016 10/29/11   Historical Provider, MD  SUMAtriptan (IMITREX) 25 MG tablet Take 25 mg by mouth every 2 (two) hours as needed for migraine or headache.     Historical Provider, MD   BP 137/63 mmHg  Pulse 79  Temp(Src) 98.5 F (36.9 C) (Oral)  Resp 18  SpO2 100% Physical Exam  Constitutional: She is oriented to person, place, and time. She appears well-developed.  Elderly, frail  HENT:  Head: Normocephalic and atraumatic.  Right Ear: External ear normal.  Left Ear: External ear normal.  Mouth/Throat: Oropharyngeal exudate present.  Eyes: Conjunctivae and EOM are normal. Pupils are equal, round, and reactive to light.  Neck: Normal range of motion and phonation normal. Neck supple.  Cardiovascular: Normal rate, regular rhythm and normal heart sounds.   Pulmonary/Chest: Effort normal and breath sounds normal. She exhibits no bony tenderness.  Abdominal: Soft. There is no tenderness (Left lower quadrant, mild).  Musculoskeletal: Normal range of motion.  Neurological: She is alert and oriented to person, place, and time. No cranial nerve deficit or sensory deficit. She exhibits normal muscle tone. Coordination normal.  Skin: Skin is warm, dry and intact.  Psychiatric: She has a normal mood and affect. Her behavior is normal. Judgment and thought content normal.  Nursing note and vitals reviewed.   ED Course  Procedures (including critical care time) Medications  0.9 %  sodium chloride infusion ( Intravenous New Bag/Given 03/25/16 1907)  mirtazapine (REMERON) tablet 45 mg (not administered)  LORazepam (ATIVAN) tablet 1 mg (not administered)  sodium chloride 0.9 % bolus 500 mL (0 mLs Intravenous Stopped 03/25/16 1949)  ketorolac (TORADOL) 30 MG/ML injection 15 mg (15 mg  Intravenous Given 03/25/16 1906)  iopamidol (ISOVUE-300) 61 % injection 100 mL (100 mLs Intravenous Contrast Given 03/25/16 2008)    Patient Vitals for the past 24 hrs:  BP Temp Temp src Pulse Resp SpO2  03/25/16 2200 137/63 mmHg - - 79 - 100 %  03/25/16 2145 - - - 86 - 100 %  03/25/16 2144 132/61 mmHg - - - - -  03/25/16 1812 128/69 mmHg - - 87 18 100 %  03/25/16 1549 145/65 mmHg 98.5 F (36.9 C) Oral 101 14 96 %  03/25/16 1408 147/84 mmHg 100.4 F (38 C) Oral 104 16 98 %    10:20 PM Reevaluation with update and discussion. After initial assessment and treatment, an updated evaluation reveals She states her back  pain is somewhat better. She is not able to yet produce a stool sample that is not contaminated with urine. She states that this is usual for her. Rakesha Dalporto L   10:24 PM-Consult complete with [facility];ist. Patient case explained and discussed. He agrees to admit patient for further evaluation and treatment. Call ended at [facility] - Abnormal; Notable for the following:    Glucose, Bld 130 (*)    GFR calc non Af Amer 60 (*)    All other components within normal limits  CBC - Abnormal; Notable for the following:    WBC 20.8 (*)    All other components within normal limits  URINALYSIS, ROUTINE W REFLEX MICROSCOPIC (NOT AT [facility]) - Abnormal; Notable for the following:    Ketones, ur 15 (*)    All other components within normal limits  GASTROINTESTINAL PANEL BY PCR, STOOL (REPLACES STOOL CULTURE)  C DIFFICILE QUICK SCREEN W PCR REFLEX  LIPASE, BLOOD  POC OCCULT BLOOD, ED    Imaging Review Ct Abdomen Pelvis W Contrast  03/25/2016  CLINICAL DATA:  Low back pain and n/v/d starting this morning. Pain score 8/10. EXAM: CT ABDOMEN AND PELVIS WITH CONTRAST TECHNIQUE: Multidetector CT imaging of the abdomen and pelvis was performed using the standard protocol following bolus administration of intravenous contrast.  CONTRAST:  162mL ISOVUE-300 IOPAMIDOL (ISOVUE-300) INJECTION 61% COMPARISON:  None. FINDINGS: Lower chest:  No acute findings. Hepatobiliary: No masses or other significant abnormality. Pancreas: No mass, inflammatory changes, or other significant abnormality. Spleen: Within normal limits in size and appearance. Adrenals/Urinary Tract: 17 x 21 mm left adrenal mass. No urolithiasis or obstructive uropathy. 10 mm hypodense, fluid attenuating left interpolar renal mass most consistent with a small cyst. Normal bladder. Stomach/Bowel: No bowel wall thickening or bowel dilatation. Multiple air-fluid levels throughout the small bowel and colon. No pneumatosis, pneumoperitoneum or portal venous gas. Two small ventral infraumbilical hernia is containing loops of small bowel. Vascular/Lymphatic: No pathologically enlarged lymph nodes. No evidence of abdominal aortic aneurysm. Abdominal aortic atherosclerosis. Reproductive: No mass or other significant abnormality. Other: None. Musculoskeletal: No suspicious bone lesions identified. Degenerative disc disease throughout the lumbar spine with facet arthropathy. IMPRESSION: 1. Multiple air-fluid levels throughout the small bowel and colon as can be seen with enterocolitis likely secondary to an infectious etiology. 2. Two small ventral infraumbilical hernia is containing loops of small bowel. 3. Indeterminate 17 x 21 mm left adrenal mass which is unchanged in size compared with prior radiology report of a CT abdomen dated 01/31/2000. 4.  Aortic Atherosclerosis (ICD10-170.0) Electronically Signed   By: Kathreen Devoid   On: 03/25/2016 20:33   I have personally reviewed and evaluated these images and lab results as part of my medical decision-making.   EKG Interpretation None      MDM   Final diagnoses:  Enteritis    Nonspecific enteritis, with abdominal discomfort, and reported diarrhea. Patient remains comfortable and will require further testing and treatment for  full diagnostic and therapeutic efficacy. Doubt sepsis, metabolic instability or impending vascular collapse.   Nursing Notes Reviewed/ Care Coordinated, and agree without changes. Applicable Imaging Reviewed.  Interpretation of Laboratory Data incorporated into ED treatment  Plan: Stay in hospital for further testing and treatment    Daleen Bo, MD 03/27/16 1224

## 2016-03-25 NOTE — H&P (Signed)
History and Physical    Christina Escobar [id number] DOB: 1929/10/12 DOA: 03/25/2016   PCP: Haywood Pao, MD Chief Complaint:  Chief Complaint  Patient presents with  . Back Pain  . Emesis  . Diarrhea    HPI: Christina Escobar is a 82 y.o. female with medical history significant of HTN, patient was recently put on cefdinir starting the 4th of the month for sinusitis.  Almost immediately after starting the medication she noticed a change in stool and began to develop diarrhea which has progressed to include N/V as of this morning.  Symptoms are persistent, worsening.  She felt like she had a fever earlier today.  Didn't eat or drink anything since 9 AM today due to feeling that it would make diarrhea worse.  ED Course: C.Diff sent.  Review of Systems: As per HPI otherwise 10 point review of systems negative.    Past Medical History  Diagnosis Date  . Hypertension   . Thrombosed hemorrhoids   . Cataracts, bilateral   . Migraine   . IBS (irritable bowel syndrome)   . Basal cell carcinoma   . Anxiety   . Depression   . Osteopenia   . Diverticulosis     hx of stricture in colon  . Hemorrhoids   . Insomnia   . Allergy   . Anal fissure     Past Surgical History  Procedure Laterality Date  . Cataract extraction, bilateral Bilateral 1999  . Colon resection  2001  . Incision and drainage  11/05/2011    thrombosed hemorrhoid  . Transanal excision of rectal mass with hemorrhoid injection  2007    anal fissure  . Basal cell carcinoma excision  [id number]  . Appendectomy       reports that she has never smoked. She has never used smokeless tobacco. She reports that she drinks alcohol. She reports that she does not use illicit drugs.  Allergies  Allergen Reactions  . Erythromycin Nausea And Vomiting  . Flagyl [Metronidazole] Diarrhea    Family History  Problem Relation Age of Onset  . Alzheimer's disease Mother   . Heart disease Father   . Heart failure Father    . Heart failure Mother   . Other Maternal Grandfather     cerebral hemorrhage  . Skin cancer Brother   . Heart disease Brother       Prior to Admission medications   Medication Sig Start Date End Date Taking? Authorizing Provider  acetaminophen (TYLENOL) 500 MG tablet Take 1,000 mg by mouth as needed for moderate pain.    Yes Historical Provider, MD  amLODipine (NORVASC) 5 MG tablet TK 1 T PO QD 02/17/16  Yes Historical Provider, MD  Calcium Carbonate-Vitamin D (CALTRATE 600+D PO) Take 1 tablet by mouth 2 (two) times daily.    Yes Historical Provider, MD  fluticasone (FLONASE) 50 MCG/ACT nasal spray Place 1 spray into both nostrils at bedtime.    Yes Historical Provider, MD  loperamide (IMODIUM A-D) 2 MG tablet Take 2 mg by mouth 4 (four) times daily as needed for diarrhea or loose stools.   Yes Historical Provider, MD  loratadine (CLARITIN) 10 MG tablet Take 10 mg by mouth daily.   Yes Historical Provider, MD  LORazepam (ATIVAN) 1 MG tablet TK 0.5 T PO BID 03/18/16  Yes Historical Provider, MD  mirtazapine (REMERON) 45 MG tablet Take 45 mg by mouth at bedtime.  10/16/11  Yes Historical Provider, MD  Multiple Vitamins-Minerals (CENTRUM SILVER PO)  Take 1 tablet by mouth daily.    Yes Historical Provider, MD  polyethylene glycol powder (GLYCOLAX/MIRALAX) powder Take 8.5 g by mouth at bedtime.  10/16/11  Yes Historical Provider, MD  psyllium (METAMUCIL) 58.6 % packet Take 0.5 packets by mouth daily.    Yes Historical Provider, MD  quinapril (ACCUPRIL) 40 MG tablet Take 40 mg by mouth Daily.  10/16/11  Yes Historical Provider, MD  zolpidem (AMBIEN) 10 MG tablet Take 10 mg by mouth at bedtime as needed for sleep.  09/21/11  Yes Historical Provider, MD  SUMAtriptan (IMITREX) 25 MG tablet Take 25 mg by mouth every 2 (two) hours as needed for migraine or headache.     Historical Provider, MD    Physical Exam: Filed Vitals:   03/25/16 2145 03/25/16 2200 03/25/16 2215 03/25/16 2230  BP:  137/63  126/111    Pulse: 86 79 83   Temp:      TempSrc:      Resp:      SpO2: 100% 100% 98%       Constitutional: NAD, calm, comfortable Eyes: PERRL, lids and conjunctivae normal ENMT: Mucous membranes are moist. Posterior pharynx clear of any exudate or lesions.Normal dentition.  Neck: normal, supple, no masses, no thyromegaly Respiratory: clear to auscultation bilaterally, no wheezing, no crackles. Normal respiratory effort. No accessory muscle use.  Cardiovascular: Regular rate and rhythm, no murmurs / rubs / gallops. No extremity edema. 2+ pedal pulses. No carotid bruits.  Abdomen: no tenderness, no masses palpated. No hepatosplenomegaly. Bowel sounds positive. Diffuse watery diarrhea in bedside commode, no melena, non-bloody, dosent smell like C.Diff. Musculoskeletal: no clubbing / cyanosis. No joint deformity upper and lower extremities. Good ROM, no contractures. Normal muscle tone.  Skin: no rashes, lesions, ulcers. No induration Neurologic: CN 2-12 grossly intact. Sensation intact, DTR normal. Strength 5/5 in all 4.  Psychiatric: Normal judgment and insight. Alert and oriented x 3. Normal mood.    Labs on Admission: I have personally reviewed following labs and imaging studies  CBC:  Recent Labs Lab 03/25/16 1433  WBC 20.8*  HGB 12.9  HCT 38.1  MCV 91.1  PLT [id number]   Basic Metabolic Panel:  Recent Labs Lab 03/25/16 1433  NA 135  K 3.9  CL 103  CO2 23  GLUCOSE 130*  BUN 13  CREATININE 0.87  CALCIUM 9.3   GFR: CrCl cannot be calculated (Unknown ideal weight.). Liver Function Tests:  Recent Labs Lab 03/25/16 1433  AST 28  ALT 21  ALKPHOS 74  BILITOT 0.7  PROT 7.9  ALBUMIN 4.5    Recent Labs Lab 03/25/16 1433  LIPASE 28   No results for input(s): AMMONIA in the last 168 hours. Coagulation Profile: No results for input(s): INR, PROTIME in the last 168 hours. Cardiac Enzymes: No results for input(s): CKTOTAL, CKMB, CKMBINDEX, TROPONINI in the last 168  hours. BNP (last 3 results) No results for input(s): PROBNP in the last 8760 hours. HbA1C: No results for input(s): HGBA1C in the last 72 hours. CBG: No results for input(s): GLUCAP in the last 168 hours. Lipid Profile: No results for input(s): CHOL, HDL, LDLCALC, TRIG, CHOLHDL, LDLDIRECT in the last 72 hours. Thyroid Function Tests: No results for input(s): TSH, T4TOTAL, FREET4, T3FREE, THYROIDAB in the last 72 hours. Anemia Panel: No results for input(s): VITAMINB12, FOLATE, FERRITIN, TIBC, IRON, RETICCTPCT in the last 72 hours. Urine analysis:    Component Value Date/Time   COLORURINE YELLOW 03/25/2016 [place] 03/25/2016 1550  LABSPEC 1.017 03/25/2016 1550   PHURINE 5.0 03/25/2016 1550   GLUCOSEU NEGATIVE 03/25/2016 1550   HGBUR NEGATIVE 03/25/2016 1550   BILIRUBINUR NEGATIVE 03/25/2016 1550   KETONESUR 15* 03/25/2016 1550   PROTEINUR NEGATIVE 03/25/2016 1550   UROBILINOGEN 0.2 09/14/2012 1751   NITRITE NEGATIVE 03/25/2016 1550   LEUKOCYTESUR NEGATIVE 03/25/2016 1550   Sepsis Labs: @LABRCNTIP (procalcitonin:4,lacticidven:4) )No results found for this or any previous visit (from the past 240 hour(s)).   Radiological Exams on Admission: Ct Abdomen Pelvis W Contrast  03/25/2016  CLINICAL DATA:  Low back pain and n/v/d starting this morning. Pain score 8/10. EXAM: CT ABDOMEN AND PELVIS WITH CONTRAST TECHNIQUE: Multidetector CT imaging of the abdomen and pelvis was performed using the standard protocol following bolus administration of intravenous contrast. CONTRAST:  167mL ISOVUE-300 IOPAMIDOL (ISOVUE-300) INJECTION 61% COMPARISON:  None. FINDINGS: Lower chest:  No acute findings. Hepatobiliary: No masses or other significant abnormality. Pancreas: No mass, inflammatory changes, or other significant abnormality. Spleen: Within normal limits in size and appearance. Adrenals/Urinary Tract: 17 x 21 mm left adrenal mass. No urolithiasis or obstructive uropathy. 10 mm  hypodense, fluid attenuating left interpolar renal mass most consistent with a small cyst. Normal bladder. Stomach/Bowel: No bowel wall thickening or bowel dilatation. Multiple air-fluid levels throughout the small bowel and colon. No pneumatosis, pneumoperitoneum or portal venous gas. Two small ventral infraumbilical hernia is containing loops of small bowel. Vascular/Lymphatic: No pathologically enlarged lymph nodes. No evidence of abdominal aortic aneurysm. Abdominal aortic atherosclerosis. Reproductive: No mass or other significant abnormality. Other: None. Musculoskeletal: No suspicious bone lesions identified. Degenerative disc disease throughout the lumbar spine with facet arthropathy. IMPRESSION: 1. Multiple air-fluid levels throughout the small bowel and colon as can be seen with enterocolitis likely secondary to an infectious etiology. 2. Two small ventral infraumbilical hernia is containing loops of small bowel. 3. Indeterminate 17 x 21 mm left adrenal mass which is unchanged in size compared with prior radiology report of a CT abdomen dated 01/31/2000. 4.  Aortic Atherosclerosis (ICD10-170.0) Electronically Signed   By: Kathreen Devoid   On: 03/25/2016 20:33    EKG: Independently reviewed.  Assessment/Plan Principal Problem:   Antibiotic enterocolitis   Enterocolitis - believed to be caused by antibiotics  C.Diff pending but C.Diff is felt to be less likely despite HPI due to: 1) Lack of C.Diff smell, 2) clear involvement of small bowel on CT scan.  Did discuss empiric treatment, but this would be flagyl (which patient confirms causes her to have diarrhea), so will hold off for now pending C.Diff quick scan results which hopefully should be back in another hour or so.  IVF  Zofran for nausea  Imodium PRN (stop this if C.Diff is positive).   DVT prophylaxis: Lovenox Code Status: Full Family Communication: Husband at bedside Consults called: None Admission status: Admit to  obs   Katerin Negrete, [facility] Pager [phone] from 7PM-7AM  If 7AM-7PM, please contact the day physician for the patient [email or web address] Password [facility]  03/25/2016, 11:27 PM

## 2016-03-25 NOTE — ED Notes (Signed)
Pt c/o lower back pain and n/v/d starting this morning.  Pain score 8/10.  Pt reports she has been taking Cefdinir x 6 days d/t "a cold."  Sts her PCP directed her to stop taking medication yesterday d/t abnormal colored BMs.

## 2016-03-26 DIAGNOSIS — A047 Enterocolitis due to Clostridium difficile: Secondary | ICD-10-CM | POA: Diagnosis not present

## 2016-03-26 LAB — CBC
HCT: 34.3 % — ABNORMAL LOW (ref 36.0–46.0)
Hemoglobin: 11.6 g/dL — ABNORMAL LOW (ref 12.0–15.0)
MCH: 30.9 pg (ref 26.0–34.0)
MCHC: 33.8 g/dL (ref 30.0–36.0)
MCV: 91.2 fL (ref 78.0–100.0)
Platelets: 270 10*3/uL (ref 150–400)
RBC: 3.76 MIL/uL — ABNORMAL LOW (ref 3.87–5.11)
RDW: 12.7 % (ref 11.5–15.5)
WBC: 14.8 10*3/uL — AB (ref 4.0–10.5)

## 2016-03-26 LAB — GASTROINTESTINAL PANEL BY PCR, STOOL (REPLACES STOOL CULTURE)
Adenovirus F40/41: NOT DETECTED
Astrovirus: NOT DETECTED
CYCLOSPORA CAYETANENSIS: NOT DETECTED
Campylobacter species: NOT DETECTED
Cryptosporidium: NOT DETECTED
E. COLI O157: NOT DETECTED
ENTAMOEBA HISTOLYTICA: NOT DETECTED
ENTEROPATHOGENIC E COLI (EPEC): NOT DETECTED
Enteroaggregative E coli (EAEC): NOT DETECTED
Enterotoxigenic E coli (ETEC): NOT DETECTED
GIARDIA LAMBLIA: NOT DETECTED
Norovirus GI/GII: NOT DETECTED
Plesimonas shigelloides: NOT DETECTED
Rotavirus A: NOT DETECTED
SAPOVIRUS (I, II, IV, AND V): NOT DETECTED
SHIGELLA/ENTEROINVASIVE E COLI (EIEC): NOT DETECTED
Salmonella species: NOT DETECTED
Shiga like toxin producing E coli (STEC): NOT DETECTED
VIBRIO SPECIES: NOT DETECTED
Vibrio cholerae: NOT DETECTED
YERSINIA ENTEROCOLITICA: NOT DETECTED

## 2016-03-26 LAB — BASIC METABOLIC PANEL
ANION GAP: 7 (ref 5–15)
BUN: 9 mg/dL (ref 6–20)
CALCIUM: 8 mg/dL — AB (ref 8.9–10.3)
CO2: 20 mmol/L — ABNORMAL LOW (ref 22–32)
Chloride: 111 mmol/L (ref 101–111)
Creatinine, Ser: 0.73 mg/dL (ref 0.44–1.00)
GFR calc Af Amer: >60 mL/min (ref >60)
GLUCOSE: 105 mg/dL — AB (ref 65–99)
Potassium: 3.4 mmol/L — ABNORMAL LOW (ref 3.5–5.1)
SODIUM: 138 mmol/L (ref 135–145)

## 2016-03-26 LAB — C DIFFICILE QUICK SCREEN W PCR REFLEX
C DIFFICILE (CDIFF) INTERP: DETECTED
C DIFFICILE (CDIFF) TOXIN: POSITIVE — AB
C Diff antigen: POSITIVE — AB

## 2016-03-26 MED ORDER — CHLORHEXIDINE GLUCONATE 0.12 % MT SOLN
15.0000 mL | Freq: Two times a day (BID) | OROMUCOSAL | Status: DC
  Administered 2016-03-27 – 2016-03-28 (×2): 15 mL via OROMUCOSAL
  Filled 2016-03-26 (×4): qty 15

## 2016-03-26 MED ORDER — VANCOMYCIN 50 MG/ML ORAL SOLUTION
125.0000 mg | Freq: Four times a day (QID) | ORAL | Status: DC
  Administered 2016-03-26 – 2016-03-28 (×7): 125 mg via ORAL
  Filled 2016-03-26 (×10): qty 2.5

## 2016-03-26 MED ORDER — CETYLPYRIDINIUM CHLORIDE 0.05 % MT LIQD
7.0000 mL | Freq: Two times a day (BID) | OROMUCOSAL | Status: DC
  Administered 2016-03-27 (×2): 7 mL via OROMUCOSAL

## 2016-03-26 MED ORDER — CALCIUM CARBONATE-VITAMIN D 600-400 MG-UNIT PO TABS
1.0000 | ORAL_TABLET | Freq: Two times a day (BID) | ORAL | Status: DC
  Filled 2016-03-26: qty 1

## 2016-03-26 MED ORDER — CALCIUM CARBONATE-VITAMIN D 600-400 MG-UNIT PO TABS: ORAL_TABLET | Freq: Two times a day (BID) | ORAL | Status: DC

## 2016-03-26 MED ORDER — CALCIUM CARBONATE-VITAMIN D 500-200 MG-UNIT PO TABS
1.0000 | ORAL_TABLET | Freq: Two times a day (BID) | ORAL | Status: DC
  Administered 2016-03-26 – 2016-03-28 (×5): 1 via ORAL
  Filled 2016-03-26 (×5): qty 1

## 2016-03-26 MED ORDER — LORATADINE 10 MG PO TABS
10.0000 mg | ORAL_TABLET | Freq: Every day | ORAL | Status: DC
  Administered 2016-03-26 – 2016-03-28 (×3): 10 mg via ORAL
  Filled 2016-03-26 (×3): qty 1

## 2016-03-26 MED ORDER — LISINOPRIL 20 MG PO TABS
40.0000 mg | ORAL_TABLET | Freq: Every day | ORAL | Status: DC
  Administered 2016-03-26 – 2016-03-28 (×3): 40 mg via ORAL
  Filled 2016-03-26 (×3): qty 2

## 2016-03-26 MED ORDER — BUTALBITAL-APAP-CAFFEINE 50-325-40 MG PO TABS
2.0000 | ORAL_TABLET | Freq: Once | ORAL | Status: AC
  Administered 2016-03-26: 2 via ORAL
  Filled 2016-03-26: qty 2

## 2016-03-26 MED ORDER — HYDROCORTISONE 1 % EX CREA
TOPICAL_CREAM | Freq: Two times a day (BID) | CUTANEOUS | Status: DC
  Administered 2016-03-26 – 2016-03-28 (×5): via TOPICAL
  Filled 2016-03-26: qty 28

## 2016-03-26 NOTE — Care Management Obs Status (Signed)
[place] NOTIFICATION   Patient Details  Name: Christina Escobar MRN: [id number] Date of Birth: October 30, 1929   Medicare Observation Status Notification Given:  Yes    Guadalupe Maple, RN 03/26/2016, 4:05 PM

## 2016-03-26 NOTE — Progress Notes (Addendum)
PROGRESS NOTE  Christina Escobar [id number] DOB: [birth year] DOA: 03/25/2016 PCP: Haywood Pao, MD     Brief Narrative: 82 y.o. female with medical history significant of HTN, patient was recently put on cefdinir starting the 4th of the month for sinusitis. Almost immediately after starting the medication she noticed a change in stool and began to develop diarrhea which has progressed to include N/V  Assessment & Plan: Principal Problem:   Antibiotic enterocolitis   Enterocolitis - believed to be caused by antibiotics - C.Diff pending  - symptomatically she is improving IVF Zofran for nausea  HTN - continue Lisinopril  DVT prophylaxis: Lovenox Code Status: Full Family Communication: d/w husband bedside (retired cardiologist) Disposition Plan: home when ready, 1 day  Consultants:   None   Procedures:   None   Antimicrobials:  None    Subjective: Feels better, no further nausea.  Objective: Filed Vitals:   03/25/16 2230 03/25/16 2345 03/25/16 2359 03/26/16 0545  BP: 126/111 148/63 147/94 139/77  Pulse:    115  Temp:   98 F (36.7 C) 98.8 F (37.1 C)  TempSrc:   Oral Oral  Resp:   18 18  SpO2:   100% 100%   No intake or output data in the 24 hours ending 03/26/16 1314 There were no vitals filed for this visit.  Examination: Constitutional: NAD Filed Vitals:   03/25/16 2230 03/25/16 2345 03/25/16 2359 03/26/16 0545  BP: 126/111 148/63 147/94 139/77  Pulse:    115  Temp:   98 F (36.7 C) 98.8 F (37.1 C)  TempSrc:   Oral Oral  Resp:   18 18  SpO2:   100% 100%   Respiratory: clear to auscultation bilaterally, no wheezing, no crackles. Cardiovascular: Regular rate and rhythm, no murmurs / rubs / gallops.  Abdomen: no tenderness. Bowel sounds positive.  Neurologic: non focal    Data Reviewed: I have personally reviewed following labs and imaging studies  CBC:  Recent Labs Lab 03/25/16 1433 03/26/16 0523    WBC 20.8* 14.8*  HGB 12.9 11.6*  HCT 38.1 34.3*  MCV 91.1 91.2  PLT 325 [id number]   Basic Metabolic Panel:  Recent Labs Lab 03/25/16 1433 03/26/16 0523  NA 135 138  K 3.9 3.4*  CL 103 111  CO2 23 20*  GLUCOSE 130* 105*  BUN 13 9  CREATININE 0.87 0.73  CALCIUM 9.3 8.0*   GFR: CrCl cannot be calculated (Unknown ideal weight.). Liver Function Tests:  Recent Labs Lab 03/25/16 1433  AST 28  ALT 21  ALKPHOS 74  BILITOT 0.7  PROT 7.9  ALBUMIN 4.5    Recent Labs Lab 03/25/16 1433  LIPASE 28   No results for input(s): AMMONIA in the last 168 hours. Coagulation Profile: No results for input(s): INR, PROTIME in the last 168 hours. Cardiac Enzymes: No results for input(s): CKTOTAL, CKMB, CKMBINDEX, TROPONINI in the last 168 hours. BNP (last 3 results) No results for input(s): PROBNP in the last 8760 hours. HbA1C: No results for input(s): HGBA1C in the last 72 hours. CBG: No results for input(s): GLUCAP in the last 168 hours. Lipid Profile: No results for input(s): CHOL, HDL, LDLCALC, TRIG, CHOLHDL, LDLDIRECT in the last 72 hours. Thyroid Function Tests: No results for input(s): TSH, T4TOTAL, FREET4, T3FREE, THYROIDAB in the last 72 hours. Anemia Panel: No results for input(s): VITAMINB12, FOLATE, FERRITIN, TIBC, IRON, RETICCTPCT in the last 72 hours. Urine analysis:    Component Value Date/Time   COLORURINE YELLOW 03/25/2016  [place] 03/25/2016 1550   LABSPEC 1.017 03/25/2016 1550   PHURINE 5.0 03/25/2016 1550   GLUCOSEU NEGATIVE 03/25/2016 1550   HGBUR NEGATIVE 03/25/2016 1550   BILIRUBINUR NEGATIVE 03/25/2016 1550   KETONESUR 15* 03/25/2016 1550   PROTEINUR NEGATIVE 03/25/2016 1550   UROBILINOGEN 0.2 09/14/2012 1751   NITRITE NEGATIVE 03/25/2016 1550   LEUKOCYTESUR NEGATIVE 03/25/2016 1550   Sepsis Labs: Invalid input(s): PROCALCITONIN, LACTICIDVEN  No results found for this or any previous visit (from the past 240 hour(s)).     Radiology Studies: Ct Abdomen Pelvis W Contrast  03/25/2016  CLINICAL DATA:  Low back pain and n/v/d starting this morning. Pain score 8/10. EXAM: CT ABDOMEN AND PELVIS WITH CONTRAST TECHNIQUE: Multidetector CT imaging of the abdomen and pelvis was performed using the standard protocol following bolus administration of intravenous contrast. CONTRAST:  187mL ISOVUE-300 IOPAMIDOL (ISOVUE-300) INJECTION 61% COMPARISON:  None. FINDINGS: Lower chest:  No acute findings. Hepatobiliary: No masses or other significant abnormality. Pancreas: No mass, inflammatory changes, or other significant abnormality. Spleen: Within normal limits in size and appearance. Adrenals/Urinary Tract: 17 x 21 mm left adrenal mass. No urolithiasis or obstructive uropathy. 10 mm hypodense, fluid attenuating left interpolar renal mass most consistent with a small cyst. Normal bladder. Stomach/Bowel: No bowel wall thickening or bowel dilatation. Multiple air-fluid levels throughout the small bowel and colon. No pneumatosis, pneumoperitoneum or portal venous gas. Two small ventral infraumbilical hernia is containing loops of small bowel. Vascular/Lymphatic: No pathologically enlarged lymph nodes. No evidence of abdominal aortic aneurysm. Abdominal aortic atherosclerosis. Reproductive: No mass or other significant abnormality. Other: None. Musculoskeletal: No suspicious bone lesions identified. Degenerative disc disease throughout the lumbar spine with facet arthropathy. IMPRESSION: 1. Multiple air-fluid levels throughout the small bowel and colon as can be seen with enterocolitis likely secondary to an infectious etiology. 2. Two small ventral infraumbilical hernia is containing loops of small bowel. 3. Indeterminate 17 x 21 mm left adrenal mass which is unchanged in size compared with prior radiology report of a CT abdomen dated 01/31/2000. 4.  Aortic Atherosclerosis (ICD10-170.0) Electronically Signed   By: Kathreen Devoid   On: 03/25/2016  20:33     Scheduled Meds: . amLODipine  5 mg Oral Daily  . antiseptic oral rinse  7 mL Mouth Rinse q12n4p  . calcium-vitamin D  1 tablet Oral BID  . chlorhexidine  15 mL Mouth Rinse BID  . enoxaparin (LOVENOX) injection  40 mg Subcutaneous Q24H  . fluticasone  1 spray Each Nare QHS  . hydrocortisone cream   Topical BID  . lisinopril  40 mg Oral Daily  . loratadine  10 mg Oral Daily  . LORazepam  1 mg Oral BID  . mirtazapine  45 mg Oral QHS   Continuous Infusions: . sodium chloride 125 mL/hr at 03/25/16 1907  . sodium chloride 125 mL/hr at 03/26/16 0048    Marzetta Board, MD, PhD [facility] Pager [phone]  If 7PM-7AM, please contact night-coverage [email or web address] Password [email or web address] 03/26/2016, 1:14 PM

## 2016-03-27 DIAGNOSIS — A498 Other bacterial infections of unspecified site: Secondary | ICD-10-CM | POA: Diagnosis present

## 2016-03-27 DIAGNOSIS — A047 Enterocolitis due to Clostridium difficile: Secondary | ICD-10-CM | POA: Diagnosis not present

## 2016-03-27 LAB — CBC
HEMATOCRIT: 32.4 % — AB (ref 36.0–46.0)
HEMOGLOBIN: 10.8 g/dL — AB (ref 12.0–15.0)
MCH: 30.7 pg (ref 26.0–34.0)
MCHC: 33.3 g/dL (ref 30.0–36.0)
MCV: 92 fL (ref 78.0–100.0)
Platelets: 286 10*3/uL (ref 150–400)
RBC: 3.52 MIL/uL — AB (ref 3.87–5.11)
RDW: 13 % (ref 11.5–15.5)
WBC: 14.8 10*3/uL — AB (ref 4.0–10.5)

## 2016-03-27 MED ORDER — BUTALBITAL-APAP-CAFFEINE 50-325-40 MG PO TABS
2.0000 | ORAL_TABLET | Freq: Every day | ORAL | Status: DC | PRN
  Administered 2016-03-28: 2 via ORAL
  Filled 2016-03-27: qty 2

## 2016-03-27 MED ORDER — KETOROLAC TROMETHAMINE 10 MG PO TABS: 10.0000 mg | ORAL_TABLET | Freq: Every day | ORAL | Status: DC | PRN

## 2016-03-27 NOTE — Progress Notes (Signed)
PROGRESS NOTE  Christina Escobar [id number] DOB: August 08, 1930 DOA: 03/25/2016 PCP: Haywood Pao, MD     Brief Narrative: 82 y.o. female with medical history significant of HTN, patient was recently put on cefdinir starting the 4th of the month for sinusitis. Almost immediately after starting the medication she noticed a change in stool and began to develop diarrhea which has progressed to include N/V  Assessment & Plan: Principal Problem:   Antibiotic enterocolitis Active Problems:   Migraine   Clostridium difficile infection   C diff enterocolitis - in the setting of recent antibiotic use - started po Vancomycin 7/13 - ongoing diarrhea this morning, already had 5 liquid BMs within 2 hours - will need total 14 days.  - home when diarrhea slows down   Migraine - received Fioricet last night, helped. Order PRN  HTN - continue Lisinopril, BP controlled this morning    DVT prophylaxis: Lovenox Code Status: Full Family Communication: d/w husband bedside (retired cardiologist) Disposition Plan: home when ready   Consultants:   None   Procedures:   None   Antimicrobials:  Po Vancomycin 7/13>>  Subjective: - had a rough night,   Objective: Filed Vitals:   03/26/16 0545 03/26/16 1500 03/26/16 2139 03/27/16 0605  BP: 139/77 134/58 152/68 130/51  Pulse: 115 78 90 82  Temp: 98.8 F (37.1 C) 98.5 F (36.9 C) 98.3 F (36.8 C) 98.6 F (37 C)  TempSrc: Oral Oral Oral Oral  Resp: 18 18 18 18   SpO2: 100% 99% 98% 98%    Intake/Output Summary (Last 24 hours) at 03/27/16 1043 Last data filed at 03/27/16 0600  Gross per 24 hour  Intake    240 ml  Output   1600 ml  Net  -1360 ml   There were no vitals filed for this visit.  Examination: Constitutional: NAD Filed Vitals:   03/26/16 0545 03/26/16 1500 03/26/16 2139 03/27/16 0605  BP: 139/77 134/58 152/68 130/51  Pulse: 115 78 90 82  Temp: 98.8 F (37.1 C) 98.5 F (36.9 C) 98.3 F (36.8 C) 98.6 F  (37 C)  TempSrc: Oral Oral Oral Oral  Resp: 18 18 18 18   SpO2: 100% 99% 98% 98%   Respiratory: clear to auscultation bilaterally, no wheezing, no crackles. Cardiovascular: Regular rate and rhythm, no murmurs / rubs / gallops.  Abdomen: no tenderness. Bowel sounds positive.  Neurologic: non focal    Data Reviewed: I have personally reviewed following labs and imaging studies  CBC:  Recent Labs Lab 03/25/16 1433 03/26/16 0523 03/27/16 0530  WBC 20.8* 14.8* 14.8*  HGB 12.9 11.6* 10.8*  HCT 38.1 34.3* 32.4*  MCV 91.1 91.2 92.0  PLT 325 270 [id number]   Basic Metabolic Panel:  Recent Labs Lab 03/25/16 1433 03/26/16 0523  NA 135 138  K 3.9 3.4*  CL 103 111  CO2 23 20*  GLUCOSE 130* 105*  BUN 13 9  CREATININE 0.87 0.73  CALCIUM 9.3 8.0*   GFR: CrCl cannot be calculated (Unknown ideal weight.). Liver Function Tests:  Recent Labs Lab 03/25/16 1433  AST 28  ALT 21  ALKPHOS 74  BILITOT 0.7  PROT 7.9  ALBUMIN 4.5    Recent Labs Lab 03/25/16 1433  LIPASE 28   No results for input(s): AMMONIA in the last 168 hours. Coagulation Profile: No results for input(s): INR, PROTIME in the last 168 hours. Cardiac Enzymes: No results for input(s): CKTOTAL, CKMB, CKMBINDEX, TROPONINI in the last 168 hours. BNP (last 3 results) No results  for input(s): PROBNP in the last 8760 hours. HbA1C: No results for input(s): HGBA1C in the last 72 hours. CBG: No results for input(s): GLUCAP in the last 168 hours. Lipid Profile: No results for input(s): CHOL, HDL, LDLCALC, TRIG, CHOLHDL, LDLDIRECT in the last 72 hours. Thyroid Function Tests: No results for input(s): TSH, T4TOTAL, FREET4, T3FREE, THYROIDAB in the last 72 hours. Anemia Panel: No results for input(s): VITAMINB12, FOLATE, FERRITIN, TIBC, IRON, RETICCTPCT in the last 72 hours. Urine analysis:    Component Value Date/Time   COLORURINE YELLOW 03/25/2016 1550   APPEARANCEUR CLEAR 03/25/2016 1550   LABSPEC 1.017  03/25/2016 1550   PHURINE 5.0 03/25/2016 1550   GLUCOSEU NEGATIVE 03/25/2016 1550   HGBUR NEGATIVE 03/25/2016 1550   BILIRUBINUR NEGATIVE 03/25/2016 1550   KETONESUR 15* 03/25/2016 1550   PROTEINUR NEGATIVE 03/25/2016 1550   UROBILINOGEN 0.2 09/14/2012 1751   NITRITE NEGATIVE 03/25/2016 1550   LEUKOCYTESUR NEGATIVE 03/25/2016 1550   Sepsis Labs: Invalid input(s): PROCALCITONIN, LACTICIDVEN  Recent Results (from the past 240 hour(s))  Gastrointestinal Panel by PCR , Stool     Status: None   Collection Time: 03/26/16  1:32 PM  Result Value Ref Range Status   Campylobacter species NOT DETECTED NOT DETECTED Final   Plesimonas shigelloides NOT DETECTED NOT DETECTED Final   Salmonella species NOT DETECTED NOT DETECTED Final   Yersinia enterocolitica NOT DETECTED NOT DETECTED Final   Vibrio species NOT DETECTED NOT DETECTED Final   Vibrio cholerae NOT DETECTED NOT DETECTED Final   Enteroaggregative E coli (EAEC) NOT DETECTED NOT DETECTED Final   Enteropathogenic E coli (EPEC) NOT DETECTED NOT DETECTED Final   Enterotoxigenic E coli (ETEC) NOT DETECTED NOT DETECTED Final   Shiga like toxin producing E coli (STEC) NOT DETECTED NOT DETECTED Final   E. coli O157 NOT DETECTED NOT DETECTED Final   Shigella/Enteroinvasive E coli (EIEC) NOT DETECTED NOT DETECTED Final   Cryptosporidium NOT DETECTED NOT DETECTED Final   Cyclospora cayetanensis NOT DETECTED NOT DETECTED Final   Entamoeba histolytica NOT DETECTED NOT DETECTED Final   Giardia lamblia NOT DETECTED NOT DETECTED Final   Adenovirus F40/41 NOT DETECTED NOT DETECTED Final   Astrovirus NOT DETECTED NOT DETECTED Final   Norovirus GI/GII NOT DETECTED NOT DETECTED Final   Rotavirus A NOT DETECTED NOT DETECTED Final   Sapovirus (I, II, IV, and V) NOT DETECTED NOT DETECTED Final  C difficile quick scan w PCR reflex     Status: Abnormal   Collection Time: 03/26/16  1:32 PM  Result Value Ref Range Status   C Diff antigen POSITIVE (A)  NEGATIVE Final   C Diff toxin POSITIVE (A) NEGATIVE Final   C Diff interpretation Toxin producing C. difficile detected.  Final    Comment: CRITICAL RESULT CALLED TO, READ BACK BY AND VERIFIED WITH: AMY ABERNATHY RN 7.13.17 @ [place]       Radiology Studies: Ct Abdomen Pelvis W Contrast  03/25/2016  CLINICAL DATA:  Low back pain and n/v/d starting this morning. Pain score 8/10. EXAM: CT ABDOMEN AND PELVIS WITH CONTRAST TECHNIQUE: Multidetector CT imaging of the abdomen and pelvis was performed using the standard protocol following bolus administration of intravenous contrast. CONTRAST:  149mL ISOVUE-300 IOPAMIDOL (ISOVUE-300) INJECTION 61% COMPARISON:  None. FINDINGS: Lower chest:  No acute findings. Hepatobiliary: No masses or other significant abnormality. Pancreas: No mass, inflammatory changes, or other significant abnormality. Spleen: Within normal limits in size and appearance. Adrenals/Urinary Tract: 17 x 21 mm left adrenal mass.  No urolithiasis or obstructive uropathy. 10 mm hypodense, fluid attenuating left interpolar renal mass most consistent with a small cyst. Normal bladder. Stomach/Bowel: No bowel wall thickening or bowel dilatation. Multiple air-fluid levels throughout the small bowel and colon. No pneumatosis, pneumoperitoneum or portal venous gas. Two small ventral infraumbilical hernia is containing loops of small bowel. Vascular/Lymphatic: No pathologically enlarged lymph nodes. No evidence of abdominal aortic aneurysm. Abdominal aortic atherosclerosis. Reproductive: No mass or other significant abnormality. Other: None. Musculoskeletal: No suspicious bone lesions identified. Degenerative disc disease throughout the lumbar spine with facet arthropathy. IMPRESSION: 1. Multiple air-fluid levels throughout the small bowel and colon as can be seen with enterocolitis likely secondary to an infectious etiology. 2. Two small ventral infraumbilical hernia is containing loops of small  bowel. 3. Indeterminate 17 x 21 mm left adrenal mass which is unchanged in size compared with prior radiology report of a CT abdomen dated 01/31/2000. 4.  Aortic Atherosclerosis (ICD10-170.0) Electronically Signed   By: Kathreen Devoid   On: 03/25/2016 20:33     Scheduled Meds: . amLODipine  5 mg Oral Daily  . antiseptic oral rinse  7 mL Mouth Rinse q12n4p  . calcium-vitamin D  1 tablet Oral BID  . chlorhexidine  15 mL Mouth Rinse BID  . enoxaparin (LOVENOX) injection  40 mg Subcutaneous Q24H  . fluticasone  1 spray Each Nare QHS  . hydrocortisone cream   Topical BID  . lisinopril  40 mg Oral Daily  . loratadine  10 mg Oral Daily  . LORazepam  1 mg Oral BID  . mirtazapine  45 mg Oral QHS  . vancomycin  125 mg Oral Q6H   Continuous Infusions: . sodium chloride 125 mL/hr at 03/25/16 1907  . sodium chloride 125 mL/hr at 03/26/16 2118    Marzetta Board, MD, PhD [facility] Pager [phone]  If 7PM-7AM, please contact night-coverage [email or web address] Password [facility] 03/27/2016, 10:43 AM

## 2016-03-27 NOTE — Progress Notes (Signed)
Nutrition Brief Note  Patient identified on the Malnutrition Screening Tool (MST) Report  Wt Readings from Last 15 Encounters:  07/12/15 111 lb (50.349 kg)  03/19/15 109 lb (49.442 kg)  12/26/14 112 lb 6 oz (50.973 kg)  12/05/14 114 lb (51.71 kg)  09/21/14 112 lb (50.803 kg)  11/20/13 111 lb (50.349 kg)  08/26/13 109 lb (49.442 kg)  05/22/13 108 lb (48.988 kg)  02/28/13 106 lb (48.081 kg)  12/22/12 110 lb (49.896 kg)  09/14/12 110 lb (49.896 kg)  11/05/11 109 lb (49.442 kg)     Patient meets criteria for normal wt based on current BMI.   Current diet order is heart healthy, patient is consuming approximately 100% of meals at this time. Labs and medications reviewed.   No nutrition interventions warranted at this time. If nutrition issues arise, please consult RD.   Satira Anis. Toleen Lachapelle, MS, RD LDN Inpatient Clinical Dietitian Pager [phone]

## 2016-03-28 DIAGNOSIS — A047 Enterocolitis due to Clostridium difficile: Secondary | ICD-10-CM | POA: Diagnosis not present

## 2016-03-28 LAB — CBC
HEMATOCRIT: 32.6 % — AB (ref 36.0–46.0)
Hemoglobin: 11.1 g/dL — ABNORMAL LOW (ref 12.0–15.0)
MCH: 31.1 pg (ref 26.0–34.0)
MCHC: 34 g/dL (ref 30.0–36.0)
MCV: 91.3 fL (ref 78.0–100.0)
Platelets: 276 10*3/uL (ref 150–400)
RBC: 3.57 MIL/uL — AB (ref 3.87–5.11)
RDW: 12.8 % (ref 11.5–15.5)
WBC: 10 10*3/uL (ref 4.0–10.5)

## 2016-03-28 LAB — BASIC METABOLIC PANEL
ANION GAP: 5 (ref 5–15)
BUN: 9 mg/dL (ref 6–20)
CO2: 25 mmol/L (ref 22–32)
Calcium: 8.4 mg/dL — ABNORMAL LOW (ref 8.9–10.3)
Chloride: 113 mmol/L — ABNORMAL HIGH (ref 101–111)
Creatinine, Ser: 0.68 mg/dL (ref 0.44–1.00)
GFR calc Af Amer: >60 mL/min (ref >60)
GFR calc non Af Amer: >60 mL/min (ref >60)
GLUCOSE: 90 mg/dL (ref 65–99)
POTASSIUM: 3.3 mmol/L — AB (ref 3.5–5.1)
Sodium: 143 mmol/L (ref 135–145)

## 2016-03-28 MED ORDER — VANCOMYCIN 50 MG/ML ORAL SOLUTION: 125.0000 mg | Freq: Four times a day (QID) | ORAL | Status: DC

## 2016-03-28 NOTE — Discharge Instructions (Signed)
Follow with Haywood Pao, MD in 1-2 weeks for hospital follow up  Please get a complete blood count and chemistry panel checked by your Primary MD at your next visit, and again as instructed by your Primary MD. Please get your medications reviewed and adjusted by your Primary MD.  Please request your Primary MD to go over all Hospital Tests and Procedure/Radiological results at the follow up, please get all Hospital records sent to your Prim MD by signing hospital release before you go home.  If you had Pneumonia of Lung problems at the Hospital: Please get a 2 view Chest X ray done in 6-8 weeks after hospital discharge or sooner if instructed by your Primary MD.  If you have Congestive Heart Failure: Please call your Cardiologist or Primary MD anytime you have any of the following symptoms:  1) 3 pound weight gain in 24 hours or 5 pounds in 1 week  2) shortness of breath, with or without a dry hacking cough  3) swelling in the hands, feet or stomach  4) if you have to sleep on extra pillows at night in order to breathe  Follow cardiac low salt diet and 1.5 lit/day fluid restriction.  If you have diabetes Accuchecks 4 times/day, Once in AM empty stomach and then before each meal. Log in all results and show them to your primary doctor at your next visit. If any glucose reading is under 80 or above 300 call your primary MD immediately.  If you have Seizure/Convulsions/Epilepsy: Please do not drive, operate heavy machinery, participate in activities at heights or participate in high speed sports until you have seen by Primary MD or a Neurologist and advised to do so again.  If you had Gastrointestinal Bleeding: Please ask your Primary MD to check a complete blood count within one week of discharge or at your next visit. Your endoscopic/colonoscopic biopsies that are pending at the time of discharge, will also need to followed by your Primary MD.  Get Medicines reviewed and  adjusted. Please take all your medications with you for your next visit with your Primary MD  Please request your Primary MD to go over all hospital tests and procedure/radiological results at the follow up, please ask your Primary MD to get all Hospital records sent to his/her office.  If you experience worsening of your admission symptoms, develop shortness of breath, life threatening emergency, suicidal or homicidal thoughts you must seek medical attention immediately by calling 911 or calling your MD immediately  if symptoms less severe.  You must read complete instructions/literature along with all the possible adverse reactions/side effects for all the Medicines you take and that have been prescribed to you. Take any new Medicines after you have completely understood and accpet all the possible adverse reactions/side effects.   Do not drive or operate heavy machinery when taking Pain medications.   Do not take more than prescribed Pain, Sleep and Anxiety Medications  Special Instructions: If you have smoked or chewed Tobacco  in the last 2 yrs please stop smoking, stop any regular Alcohol  and or any Recreational drug use.  Wear Seat belts while driving.  Please note You were cared for by a hospitalist during your hospital stay. If you have any questions about your discharge medications or the care you received while you were in the hospital after you are discharged, you can call the unit and asked to speak with the hospitalist on call if the hospitalist that took care of  you is not available. Once you are discharged, your primary care physician will handle any further medical issues. Please note that NO REFILLS for any discharge medications will be authorized once you are discharged, as it is imperative that you return to your primary care physician (or establish a relationship with a primary care physician if you do not have one) for your aftercare needs so that they can reassess your need  for medications and monitor your lab values.  You can reach the hospitalist office at phone [phone] or fax [phone]   If you do not have a primary care physician, you can call [phone] for a physician referral.  Activity: As tolerated with Full fall precautions use walker/cane & assistance as needed  Diet: regular  Disposition Home

## 2016-03-28 NOTE — Discharge Summary (Signed)
Physician Discharge Summary  Christina Escobar [id number] DOB: 1929/10/23 DOA: 03/25/2016  PCP: Haywood Pao, MD  Admit date: 03/25/2016 Discharge date: 03/28/2016  Admitted From: home Disposition:  home  Recommendations for Outpatient Follow-up:  1. Follow up with PCP in 1-2 weeks 2. Po Vancomycin for 12 additional days for C diff infection  Discharge Condition: stable CODE STATUS: Full Diet recommendation: regular  HPI: 82 y.o. female with medical history significant of HTN, patient was recently put on cefdinir starting the 4th of the month for sinusitis. Almost immediately after starting the medication she noticed a change in stool and began to develop diarrhea which has progressed to include N/V  Hospital Course: Discharge Diagnoses:  Principal Problem:   Antibiotic enterocolitis Active Problems:   Migraine   Clostridium difficile infection  C diff enterocolitis - in the setting of recent antibiotic use, patient was started on po Vancomycin 7/13, her diarrhea resolved, able to tolerate a regular diet. She was discharged home in stable condition and recommended PCP follow up in 1-2 weeks. Has 12 days left of po Vancomycin. Leukocytosis resolved.  HTN - continue home ACEI   Discharge Instructions     Medication List    STOP taking these medications        loperamide 2 MG tablet  Commonly known as:  IMODIUM A-D      TAKE these medications        acetaminophen 500 MG tablet  Commonly known as:  TYLENOL  Take 1,000 mg by mouth as needed for moderate pain.     amLODipine 5 MG tablet  Commonly known as:  NORVASC  TK 1 T PO QD     CALTRATE 600+D PO  Take 1 tablet by mouth 2 (two) times daily.     CENTRUM SILVER PO  Take 1 tablet by mouth daily.     fluticasone 50 MCG/ACT nasal spray  Commonly known as:  FLONASE  Place 1 spray into both nostrils at bedtime.     loratadine 10 MG tablet  Commonly known as:  CLARITIN  Take 10 mg by mouth daily.     LORazepam 1 MG tablet  Commonly known as:  ATIVAN  TK 0.5 T PO BID     mirtazapine 45 MG tablet  Commonly known as:  REMERON  Take 45 mg by mouth at bedtime.     polyethylene glycol powder powder  Commonly known as:  GLYCOLAX/MIRALAX  Take 8.5 g by mouth at bedtime.     psyllium 58.6 % packet  Commonly known as:  METAMUCIL  Take 0.5 packets by mouth daily.     quinapril 40 MG tablet  Commonly known as:  ACCUPRIL  Take 40 mg by mouth Daily.     SUMAtriptan 25 MG tablet  Commonly known as:  IMITREX  Take 25 mg by mouth every 2 (two) hours as needed for migraine or headache.     vancomycin 50 mg/mL oral solution  Commonly known as:  VANCOCIN  Take 2.5 mLs (125 mg total) by mouth every 6 (six) hours. For 12 more days     zolpidem 10 MG tablet  Commonly known as:  AMBIEN  Take 10 mg by mouth at bedtime as needed for sleep.           Follow-up Information    Follow up with Haywood Pao, MD. Schedule an appointment as soon as possible for a visit in 2 weeks.   Specialty:  Internal Medicine  Contact information:   [address] [place] [address] [phone]      Allergies  Allergen Reactions  . Erythromycin Nausea And Vomiting  . Flagyl [Metronidazole] Diarrhea    Consultations:  None   Procedures/Studies:  None   Ct Abdomen Pelvis W Contrast  03/25/2016  CLINICAL DATA:  Low back pain and n/v/d starting this morning. Pain score 8/10. EXAM: CT ABDOMEN AND PELVIS WITH CONTRAST TECHNIQUE: Multidetector CT imaging of the abdomen and pelvis was performed using the standard protocol following bolus administration of intravenous contrast. CONTRAST:  145mL ISOVUE-300 IOPAMIDOL (ISOVUE-300) INJECTION 61% COMPARISON:  None. FINDINGS: Lower chest:  No acute findings. Hepatobiliary: No masses or other significant abnormality. Pancreas: No mass, inflammatory changes, or other significant abnormality. Spleen: Within normal limits in size and appearance.  Adrenals/Urinary Tract: 17 x 21 mm left adrenal mass. No urolithiasis or obstructive uropathy. 10 mm hypodense, fluid attenuating left interpolar renal mass most consistent with a small cyst. Normal bladder. Stomach/Bowel: No bowel wall thickening or bowel dilatation. Multiple air-fluid levels throughout the small bowel and colon. No pneumatosis, pneumoperitoneum or portal venous gas. Two small ventral infraumbilical hernia is containing loops of small bowel. Vascular/Lymphatic: No pathologically enlarged lymph nodes. No evidence of abdominal aortic aneurysm. Abdominal aortic atherosclerosis. Reproductive: No mass or other significant abnormality. Other: None. Musculoskeletal: No suspicious bone lesions identified. Degenerative disc disease throughout the lumbar spine with facet arthropathy. IMPRESSION: 1. Multiple air-fluid levels throughout the small bowel and colon as can be seen with enterocolitis likely secondary to an infectious etiology. 2. Two small ventral infraumbilical hernia is containing loops of small bowel. 3. Indeterminate 17 x 21 mm left adrenal mass which is unchanged in size compared with prior radiology report of a CT abdomen dated 01/31/2000. 4.  Aortic Atherosclerosis (ICD10-170.0) Electronically Signed   By: Kathreen Devoid   On: 03/25/2016 20:33      Subjective: - no chest pain, shortness of breath, no abdominal pain, nausea or vomiting. Feels great.  Discharge Exam: Filed Vitals:   03/27/16 2233 03/28/16 0650  BP: 151/75 126/79  Pulse: 110 73  Temp: 98.2 F (36.8 C) 98.2 F (36.8 C)  Resp: 18 18   Filed Vitals:   03/27/16 1628 03/27/16 2233 03/28/16 0650 03/28/16 0848  BP: 131/49 151/75 126/79   Pulse: 79 110 73   Temp: 98.7 F (37.1 C) 98.2 F (36.8 C) 98.2 F (36.8 C)   TempSrc: Oral Oral Oral   Resp: 18 18 18    Height:    5\' 2"  (1.575 m)  Weight:    48.081 kg (106 lb)  SpO2: 99% 98% 99%     General: Pt is alert, awake, not in acute distress Cardiovascular:  RRR, S1/S2 +, no rubs, no gallops Respiratory: CTA bilaterally, no wheezing, no rhonchi Abdominal: Soft, NT, ND, bowel sounds + Extremities: no edema, no cyanosis    The results of significant diagnostics from this hospitalization (including imaging, microbiology, ancillary and laboratory) are listed below for reference.     Microbiology: Recent Results (from the past 240 hour(s))  Gastrointestinal Panel by PCR , Stool     Status: None   Collection Time: 03/26/16  1:32 PM  Result Value Ref Range Status   Campylobacter species NOT DETECTED NOT DETECTED Final   Plesimonas shigelloides NOT DETECTED NOT DETECTED Final   Salmonella species NOT DETECTED NOT DETECTED Final   Yersinia enterocolitica NOT DETECTED NOT DETECTED Final   Vibrio species NOT DETECTED NOT DETECTED Final  Vibrio cholerae NOT DETECTED NOT DETECTED Final   Enteroaggregative E coli (EAEC) NOT DETECTED NOT DETECTED Final   Enteropathogenic E coli (EPEC) NOT DETECTED NOT DETECTED Final   Enterotoxigenic E coli (ETEC) NOT DETECTED NOT DETECTED Final   Shiga like toxin producing E coli (STEC) NOT DETECTED NOT DETECTED Final   E. coli O157 NOT DETECTED NOT DETECTED Final   Shigella/Enteroinvasive E coli (EIEC) NOT DETECTED NOT DETECTED Final   Cryptosporidium NOT DETECTED NOT DETECTED Final   Cyclospora cayetanensis NOT DETECTED NOT DETECTED Final   Entamoeba histolytica NOT DETECTED NOT DETECTED Final   Giardia lamblia NOT DETECTED NOT DETECTED Final   Adenovirus F40/41 NOT DETECTED NOT DETECTED Final   Astrovirus NOT DETECTED NOT DETECTED Final   Norovirus GI/GII NOT DETECTED NOT DETECTED Final   Rotavirus A NOT DETECTED NOT DETECTED Final   Sapovirus (I, II, IV, and V) NOT DETECTED NOT DETECTED Final  C difficile quick scan w PCR reflex     Status: Abnormal   Collection Time: 03/26/16  1:32 PM  Result Value Ref Range Status   C Diff antigen POSITIVE (A) NEGATIVE Final   C Diff toxin POSITIVE (A) NEGATIVE Final    C Diff interpretation Toxin producing C. difficile detected.  Final    Comment: CRITICAL RESULT CALLED TO, READ BACK BY AND VERIFIED WITH: AMY ABERNATHY RN 7.13.17 @ [id number] BY RICEJ      Labs: BNP (last 3 results) No results for input(s): BNP in the last 8760 hours. Basic Metabolic Panel:  Recent Labs Lab 03/25/16 1433 03/26/16 0523 03/28/16 0520  NA 135 138 143  K 3.9 3.4* 3.3*  CL 103 111 113*  CO2 23 20* 25  GLUCOSE 130* 105* 90  BUN 13 9 9   CREATININE 0.87 0.73 0.68  CALCIUM 9.3 8.0* 8.4*   Liver Function Tests:  Recent Labs Lab 03/25/16 1433  AST 28  ALT 21  ALKPHOS 74  BILITOT 0.7  PROT 7.9  ALBUMIN 4.5    Recent Labs Lab 03/25/16 1433  LIPASE 28   No results for input(s): AMMONIA in the last 168 hours. CBC:  Recent Labs Lab 03/25/16 1433 03/26/16 0523 03/27/16 0530 03/28/16 0520  WBC 20.8* 14.8* 14.8* 10.0  HGB 12.9 11.6* 10.8* 11.1*  HCT 38.1 34.3* 32.4* 32.6*  MCV 91.1 91.2 92.0 91.3  PLT 325 270 286 276   Cardiac Enzymes: No results for input(s): CKTOTAL, CKMB, CKMBINDEX, TROPONINI in the last 168 hours. BNP: Invalid input(s): POCBNP CBG: No results for input(s): GLUCAP in the last 168 hours. D-Dimer No results for input(s): DDIMER in the last 72 hours. Hgb A1c No results for input(s): HGBA1C in the last 72 hours. Lipid Profile No results for input(s): CHOL, HDL, LDLCALC, TRIG, CHOLHDL, LDLDIRECT in the last 72 hours. Thyroid function studies No results for input(s): TSH, T4TOTAL, T3FREE, THYROIDAB in the last 72 hours.  Invalid input(s): FREET3 Anemia work up No results for input(s): VITAMINB12, FOLATE, FERRITIN, TIBC, IRON, RETICCTPCT in the last 72 hours. Urinalysis    Component Value Date/Time   COLORURINE YELLOW 03/25/2016 1550   APPEARANCEUR CLEAR 03/25/2016 1550   LABSPEC 1.017 03/25/2016 1550   PHURINE 5.0 03/25/2016 1550   GLUCOSEU NEGATIVE 03/25/2016 1550   HGBUR NEGATIVE 03/25/2016 1550   BILIRUBINUR NEGATIVE  03/25/2016 1550   KETONESUR 15* 03/25/2016 1550   PROTEINUR NEGATIVE 03/25/2016 1550   UROBILINOGEN 0.2 09/14/2012 1751   NITRITE NEGATIVE 03/25/2016 1550   LEUKOCYTESUR NEGATIVE 03/25/2016 1550   Sepsis  Labs Invalid input(s): PROCALCITONIN,  WBC,  LACTICIDVEN Microbiology Recent Results (from the past 240 hour(s))  Gastrointestinal Panel by PCR , Stool     Status: None   Collection Time: 03/26/16  1:32 PM  Result Value Ref Range Status   Campylobacter species NOT DETECTED NOT DETECTED Final   Plesimonas shigelloides NOT DETECTED NOT DETECTED Final   Salmonella species NOT DETECTED NOT DETECTED Final   Yersinia enterocolitica NOT DETECTED NOT DETECTED Final   Vibrio species NOT DETECTED NOT DETECTED Final   Vibrio cholerae NOT DETECTED NOT DETECTED Final   Enteroaggregative E coli (EAEC) NOT DETECTED NOT DETECTED Final   Enteropathogenic E coli (EPEC) NOT DETECTED NOT DETECTED Final   Enterotoxigenic E coli (ETEC) NOT DETECTED NOT DETECTED Final   Shiga like toxin producing E coli (STEC) NOT DETECTED NOT DETECTED Final   E. coli O157 NOT DETECTED NOT DETECTED Final   Shigella/Enteroinvasive E coli (EIEC) NOT DETECTED NOT DETECTED Final   Cryptosporidium NOT DETECTED NOT DETECTED Final   Cyclospora cayetanensis NOT DETECTED NOT DETECTED Final   Entamoeba histolytica NOT DETECTED NOT DETECTED Final   Giardia lamblia NOT DETECTED NOT DETECTED Final   Adenovirus F40/41 NOT DETECTED NOT DETECTED Final   Astrovirus NOT DETECTED NOT DETECTED Final   Norovirus GI/GII NOT DETECTED NOT DETECTED Final   Rotavirus A NOT DETECTED NOT DETECTED Final   Sapovirus (I, II, IV, and V) NOT DETECTED NOT DETECTED Final  C difficile quick scan w PCR reflex     Status: Abnormal   Collection Time: 03/26/16  1:32 PM  Result Value Ref Range Status   C Diff antigen POSITIVE (A) NEGATIVE Final   C Diff toxin POSITIVE (A) NEGATIVE Final   C Diff interpretation Toxin producing C. difficile detected.  Final      Comment: CRITICAL RESULT CALLED TO, READ BACK BY AND VERIFIED WITH: AMY ABERNATHY RN 7.13.17 @ [id number] BY RICEJ      Time coordinating discharge: Over 30 minutes  SIGNED:  Marzetta Board, MD  [facility] 03/28/2016, 1:48 PM Pager [phone]  If 7PM-7AM, please contact night-coverage [email or web address] Password [email or web address]

## 2016-03-28 NOTE — Progress Notes (Signed)
Discharge instructions given along with prescriptions.  Questions answered 

## 2016-04-18 ENCOUNTER — Emergency Department (HOSPITAL_COMMUNITY)
Admission: EM | Admit: 2016-04-18 | Discharge: 2016-04-19 | Disposition: A | Discharge status: Home / Self Care | Payer: Medicare Other | Attending: Emergency Medicine | Admitting: Emergency Medicine

## 2016-04-18 ENCOUNTER — Encounter (HOSPITAL_COMMUNITY): Payer: Self-pay

## 2016-04-18 DIAGNOSIS — R197 Diarrhea, unspecified: Secondary | ICD-10-CM | POA: Diagnosis present

## 2016-04-18 DIAGNOSIS — I10 Essential (primary) hypertension: Secondary | ICD-10-CM | POA: Insufficient documentation

## 2016-04-18 DIAGNOSIS — A047 Enterocolitis due to Clostridium difficile: Secondary | ICD-10-CM | POA: Insufficient documentation

## 2016-04-18 DIAGNOSIS — Z79899 Other long term (current) drug therapy: Secondary | ICD-10-CM | POA: Insufficient documentation

## 2016-04-18 DIAGNOSIS — A0472 Enterocolitis due to Clostridium difficile, not specified as recurrent: Secondary | ICD-10-CM

## 2016-04-18 DIAGNOSIS — A0471 Enterocolitis due to Clostridium difficile, recurrent: Secondary | ICD-10-CM | POA: Insufficient documentation

## 2016-04-18 HISTORY — DX: Enterocolitis due to Clostridium difficile, not specified as recurrent: A04.72

## 2016-04-18 LAB — CBC WITH DIFFERENTIAL/PLATELET
BASOS PCT: 0 %
Basophils Absolute: 0 10*3/uL (ref 0.0–0.1)
EOS ABS: 0 10*3/uL (ref 0.0–0.7)
Eosinophils Relative: 0 %
HCT: 34.3 % — ABNORMAL LOW (ref 36.0–46.0)
HEMOGLOBIN: 11.4 g/dL — AB (ref 12.0–15.0)
Lymphocytes Relative: 3 %
Lymphs Abs: 0.5 10*3/uL — ABNORMAL LOW (ref 0.7–4.0)
MCH: 30.5 pg (ref 26.0–34.0)
MCHC: 33.2 g/dL (ref 30.0–36.0)
MCV: 91.7 fL (ref 78.0–100.0)
Monocytes Absolute: 0.7 10*3/uL (ref 0.1–1.0)
Monocytes Relative: 4 %
NEUTROS ABS: 15.8 10*3/uL — AB (ref 1.7–7.7)
NEUTROS PCT: 93 %
Platelets: 250 10*3/uL (ref 150–400)
RBC: 3.74 MIL/uL — AB (ref 3.87–5.11)
RDW: 12.8 % (ref 11.5–15.5)
WBC: 17.1 10*3/uL — AB (ref 4.0–10.5)

## 2016-04-18 LAB — URINALYSIS, ROUTINE W REFLEX MICROSCOPIC
BILIRUBIN URINE: NEGATIVE
Glucose, UA: NEGATIVE mg/dL
Ketones, ur: 40 mg/dL — AB
NITRITE: NEGATIVE
Protein, ur: NEGATIVE mg/dL
SPECIFIC GRAVITY, URINE: 1.016 (ref 1.005–1.030)
pH: 6 (ref 5.0–8.0)

## 2016-04-18 LAB — LIPASE, BLOOD: Lipase: 19 U/L (ref 11–51)

## 2016-04-18 LAB — COMPREHENSIVE METABOLIC PANEL
ALK PHOS: 65 U/L (ref 38–126)
ALT: 20 U/L (ref 14–54)
ANION GAP: 10 (ref 5–15)
AST: 28 U/L (ref 15–41)
Albumin: 4.1 g/dL (ref 3.5–5.0)
BILIRUBIN TOTAL: 0.9 mg/dL (ref 0.3–1.2)
BUN: 19 mg/dL (ref 6–20)
CALCIUM: 9.1 mg/dL (ref 8.9–10.3)
CO2: 22 mmol/L (ref 22–32)
Chloride: 105 mmol/L (ref 101–111)
Creatinine, Ser: 0.89 mg/dL (ref 0.44–1.00)
GFR calc non Af Amer: 58 mL/min — ABNORMAL LOW (ref >60)
Glucose, Bld: 123 mg/dL — ABNORMAL HIGH (ref 65–99)
POTASSIUM: 4.3 mmol/L (ref 3.5–5.1)
SODIUM: 137 mmol/L (ref 135–145)
TOTAL PROTEIN: 7.2 g/dL (ref 6.5–8.1)

## 2016-04-18 LAB — URINE MICROSCOPIC-ADD ON

## 2016-04-18 LAB — C DIFFICILE QUICK SCREEN W PCR REFLEX
C DIFFICILE (CDIFF) TOXIN: NEGATIVE
C DIFFICLE (CDIFF) ANTIGEN: POSITIVE — AB

## 2016-04-18 LAB — I-STAT CG4 LACTIC ACID, ED: LACTIC ACID, VENOUS: 1.21 mmol/L (ref 0.5–1.9)

## 2016-04-18 MED ORDER — AMLODIPINE BESYLATE 5 MG PO TABS
5.0000 mg | ORAL_TABLET | Freq: Every day | ORAL | Status: DC
  Administered 2016-04-18: 5 mg via ORAL
  Filled 2016-04-18: qty 1

## 2016-04-18 MED ORDER — LORATADINE 10 MG PO TABS
10.0000 mg | ORAL_TABLET | Freq: Every day | ORAL | Status: DC
  Administered 2016-04-18: 10 mg via ORAL
  Filled 2016-04-18: qty 1

## 2016-04-18 MED ORDER — ZOLPIDEM TARTRATE 10 MG PO TABS: 10.0000 mg | ORAL_TABLET | Freq: Every evening | ORAL | Status: DC | PRN

## 2016-04-18 MED ORDER — SODIUM CHLORIDE 0.9 % IV BOLUS (SEPSIS)
1000.0000 mL | Freq: Once | INTRAVENOUS | Status: AC
  Administered 2016-04-18: 1000 mL via INTRAVENOUS

## 2016-04-18 MED ORDER — ONDANSETRON HCL 4 MG/2ML IJ SOLN
4.0000 mg | Freq: Once | INTRAMUSCULAR | Status: AC
Start: 2016-04-18 — End: 2016-04-18
  Administered 2016-04-18: 4 mg via INTRAVENOUS
  Filled 2016-04-18: qty 2

## 2016-04-18 MED ORDER — QUINAPRIL HCL 10 MG PO TABS: 40.0000 mg | ORAL_TABLET | Freq: Every day | ORAL | Status: DC

## 2016-04-18 MED ORDER — LOPERAMIDE HCL 2 MG PO CAPS: 2.0000 mg | ORAL_CAPSULE | Freq: Four times a day (QID) | ORAL | Status: DC | PRN

## 2016-04-18 MED ORDER — SODIUM CHLORIDE 0.9 % IV SOLN
INTRAVENOUS | Status: DC
  Administered 2016-04-18: 11:00:00 via INTRAVENOUS

## 2016-04-18 MED ORDER — LISINOPRIL 40 MG PO TABS
40.0000 mg | ORAL_TABLET | Freq: Every day | ORAL | Status: DC
  Administered 2016-04-18: 40 mg via ORAL
  Filled 2016-04-18: qty 1

## 2016-04-18 MED ORDER — LORAZEPAM 0.5 MG PO TABS
0.5000 mg | ORAL_TABLET | Freq: Every day | ORAL | Status: DC
  Administered 2016-04-18: 0.5 mg via ORAL
  Filled 2016-04-18: qty 1

## 2016-04-18 MED ORDER — LORAZEPAM 2 MG/ML IJ SOLN
0.5000 mg | Freq: Once | INTRAMUSCULAR | Status: AC
  Administered 2016-04-18: 0.5 mg via INTRAVENOUS
  Filled 2016-04-18: qty 1

## 2016-04-18 MED ORDER — KETOROLAC TROMETHAMINE 30 MG/ML IJ SOLN
15.0000 mg | Freq: Once | INTRAMUSCULAR | Status: AC
  Administered 2016-04-18: 15 mg via INTRAVENOUS
  Filled 2016-04-18: qty 1

## 2016-04-18 MED ORDER — MORPHINE SULFATE (PF) 2 MG/ML IV SOLN
2.0000 mg | Freq: Once | INTRAVENOUS | Status: DC
  Filled 2016-04-18: qty 1

## 2016-04-18 MED ORDER — ADULT MULTIVITAMIN W/MINERALS CH
1.0000 | ORAL_TABLET | Freq: Every day | ORAL | Status: DC
  Administered 2016-04-18: 1 via ORAL
  Filled 2016-04-18: qty 1

## 2016-04-18 MED ORDER — VANCOMYCIN 50 MG/ML ORAL SOLUTION
125.0000 mg | Freq: Four times a day (QID) | ORAL | Status: DC
  Administered 2016-04-18 – 2016-04-19 (×2): 125 mg via ORAL
  Filled 2016-04-18 (×3): qty 2.5

## 2016-04-18 MED ORDER — SUMATRIPTAN SUCCINATE 25 MG PO TABS
25.0000 mg | ORAL_TABLET | ORAL | Status: DC | PRN
  Administered 2016-04-18: 25 mg via ORAL
  Filled 2016-04-18 (×2): qty 1

## 2016-04-18 MED ORDER — ACETAMINOPHEN 325 MG PO TABS
650.0000 mg | ORAL_TABLET | Freq: Once | ORAL | Status: AC
  Administered 2016-04-18: 650 mg via ORAL
  Filled 2016-04-18: qty 2

## 2016-04-18 MED ORDER — VANCOMYCIN 50 MG/ML ORAL SOLUTION
125.0000 mg | Freq: Once | ORAL | Status: AC
  Administered 2016-04-18: 125 mg via ORAL
  Filled 2016-04-18: qty 2.5

## 2016-04-18 MED ORDER — MIRTAZAPINE 30 MG PO TABS
45.0000 mg | ORAL_TABLET | Freq: Every day | ORAL | Status: DC
  Administered 2016-04-18: 45 mg via ORAL
  Filled 2016-04-18: qty 2

## 2016-04-18 NOTE — NC FL2 (Signed)
[place] LEVEL OF CARE SCREENING TOOL     IDENTIFICATION  Patient Name: Christina Escobar Birthdate: 1930-02-10 Sex: female Admission Date (Current Location): 04/18/2016  [facility] and [place] Number:  [place] and Address:  [facility],  [place] [address] [place]      Provider Number:    Attending Physician Name and Address:  Carmin Muskrat, MD  Relative Name and Phone Number:       Current Level of Care: Hospital Recommended Level of Care: [place] Prior Approval Number:    Date Approved/Denied:   PASRR Number: [id number]  Discharge Plan: SNF    Current Diagnoses: Patient Active Problem List   Diagnosis Date Noted  . Clostridium difficile diarrhea   . Clostridium difficile infection 03/27/2016  . Antibiotic enterocolitis 03/25/2016  . Migraine 02/28/2013  . Hemorrhoids, external, thrombosed 11/05/2011    Orientation RESPIRATION BLADDER Height & Weight     Self, Time, Situation, Place  Normal Continent Weight:   Height:     BEHAVIORAL SYMPTOMS/MOOD NEUROLOGICAL BOWEL NUTRITION STATUS      Continent    AMBULATORY STATUS COMMUNICATION OF NEEDS Skin   Limited Assist Verbally Normal                       Personal Care Assistance Level of Assistance  Bathing, Dressing Bathing Assistance: Limited assistance   Dressing Assistance: Limited assistance     Functional Limitations Info             SPECIAL CARE FACTORS FREQUENCY                       Contractures      Additional Factors Info  Allergies   Allergies Info: erythromycin Flagyl meloxicam            Current Medications (04/18/2016):  This is the current hospital active medication list Current Facility-Administered Medications  Medication Dose Route Frequency Provider Last Rate Last Dose  . 0.9 %  sodium chloride infusion   Intravenous Continuous Carmin Muskrat, MD 125 mL/hr at 04/18/16 1102    . amLODipine  (NORVASC) tablet 5 mg  5 mg Oral Daily Carmin Muskrat, MD      . lisinopril (PRINIVIL,ZESTRIL) tablet 40 mg  40 mg Oral QHS Carmin Muskrat, MD      . loperamide (IMODIUM) capsule 2 mg  2 mg Oral QID PRN Carmin Muskrat, MD      . loratadine (CLARITIN) tablet 10 mg  10 mg Oral Daily Carmin Muskrat, MD      . LORazepam (ATIVAN) tablet 0.5 mg  0.5 mg Oral Daily Carmin Muskrat, MD      . mirtazapine (REMERON) tablet 45 mg  45 mg Oral QHS Carmin Muskrat, MD      . morphine 2 MG/ML injection 2 mg  2 mg Intravenous Once Carmin Muskrat, MD      . multivitamin with minerals tablet 1 tablet  1 tablet Oral Daily Carmin Muskrat, MD      . SUMAtriptan (IMITREX) tablet 25 mg  25 mg Oral Q2H PRN Carmin Muskrat, MD      . vancomycin (VANCOCIN) 50 mg/mL oral solution 125 mg  125 mg Oral Q6H Carmin Muskrat, MD      . zolpidem [facility]) tablet 10 mg  10 mg Oral QHS PRN Carmin Muskrat, MD       Current Outpatient Prescriptions  Medication Sig Dispense Refill  .  acetaminophen (TYLENOL) 500 MG tablet Take 1,000 mg by mouth 2 (two) times daily.     Marland Kitchen amLODipine (NORVASC) 5 MG tablet take 5mg s once daily  3  . Calcium Carbonate-Vitamin D (CALTRATE 600+D PO) Take 1 tablet by mouth 2 (two) times daily.     Marland Kitchen denosumab (PROLIA) 60 MG/ML SOLN injection Inject 60 mg into the skin every 6 (six) months.    . loperamide (IMODIUM A-D) 2 MG tablet Take 2 mg by mouth 4 (four) times daily as needed for diarrhea or loose stools.    Marland Kitchen loratadine (CLARITIN) 10 MG tablet Take 10 mg by mouth daily.    Marland Kitchen LORazepam (ATIVAN) 0.5 MG tablet Take 0.5 mg by mouth daily. Take 0.5mg  at 5pm    . LORazepam (ATIVAN) 1 MG tablet Take 1mg  in the morning and evening  5  . mirtazapine (REMERON) 45 MG tablet Take 45 mg by mouth at bedtime.     . Multiple Vitamins-Minerals (CENTRUM SILVER PO) Take 1 tablet by mouth daily.     . quinapril (ACCUPRIL) 40 MG tablet Take 40 mg by mouth Daily.     . SUMAtriptan (IMITREX) 25 MG tablet Take 25 mg  by mouth every 2 (two) hours as needed for migraine or headache.     . zolpidem (AMBIEN) 10 MG tablet Take 10 mg by mouth at bedtime as needed for sleep.     . vancomycin (VANCOCIN) 50 mg/mL oral solution Take 2.5 mLs (125 mg total) by mouth every 6 (six) hours. For 12 more days (Patient not taking: Reported on 04/18/2016) 125 mL 0     Discharge Medications: Please see discharge summary for a list of discharge medications.  Relevant Imaging Results:  Relevant Lab Results:   Additional Information    Carlean Jews, LCSW

## 2016-04-18 NOTE — ED Notes (Signed)
Bed: [id number] Expected date:  Expected time:  Means of arrival:  Comments: Diarrhea

## 2016-04-18 NOTE — Clinical Social Work Note (Signed)
CSW secured a skilled nursing bed at [facility] for patient today however patient has to be able to produce a specimen to assess for cdiff before she can be discharged to facility.  CSW facilitated the [facility] and the MD signed it but the diagnosis must be entered on it after the tests come back before it can be faxed to the facility.  CSW is leaving the FL2 with the RN with instructions to have the MD who is coming on next shift write in the diagnosis and then the FL2 needs to be faxed to [facility] at [phone].  [facility] have also requested a hard copy go with patient at discharge.  [facility] will need any d/c orders and or medications changes and or prescriptions if needed at discharge.Number for report is [id number].  Marland KitchenDede Query, LCSW [facility] Clinical Social Worker - Weekend Coverage cell #: [phone]

## 2016-04-18 NOTE — ED Provider Notes (Signed)
[facility] DEPT Provider Note   CSN: [id number] Arrival date & time: 04/18/16  0930  First Provider Contact:  First MD Initiated Contact with Patient 04/18/16 [phone]      Patient presents with concern of ongoing diarrhea. She has a notable history of admission here about a month ago, with diagnosis of C. difficile colitis. She completed a course of oral vancomycin one week ago. She has been generally weak, but otherwise recovering since completing medication. About 5 hours ago, the patient awoke with mild low back pain, and since that time has had at least 7 episodes of loose stool. There is associated fever, nausea, but no vomiting. No substantial abdominal pain. No ability to tolerate oral medication, though she attempted one dose of Imodium.   History   Chief Complaint Chief Complaint  Patient presents with  . Diarrhea  . Fever    HPI Christina Escobar is a 82 y.o. female.  HPI  Past Medical History:  Diagnosis Date  . Allergy   . Anal fissure   . Anxiety   . Basal cell carcinoma   . Cataracts, bilateral   . Clostridium difficile diarrhea   . Depression   . Diverticulosis    hx of stricture in colon  . Hemorrhoids   . Hypertension   . IBS (irritable bowel syndrome)   . Insomnia   . Migraine   . Osteopenia   . Thrombosed hemorrhoids     Patient Active Problem List   Diagnosis Date Noted  . Clostridium difficile diarrhea   . Clostridium difficile infection 03/27/2016  . Antibiotic enterocolitis 03/25/2016  . Migraine 02/28/2013  . Hemorrhoids, external, thrombosed 11/05/2011    Past Surgical History:  Procedure Laterality Date  . APPENDECTOMY    . BASAL CELL CARCINOMA EXCISION  [id number]  . CATARACT EXTRACTION, BILATERAL Bilateral 1999  . COLON RESECTION  2001  . INCISION AND DRAINAGE  11/05/2011   thrombosed hemorrhoid  . TRANSANAL EXCISION OF RECTAL MASS WITH HEMORRHOID INJECTION  2007   anal fissure    OB History    No data available        Home Medications    Prior to Admission medications   Medication Sig Start Date End Date Taking? Authorizing Provider  acetaminophen (TYLENOL) 500 MG tablet Take 1,000 mg by mouth as needed for moderate pain.     Historical Provider, MD  amLODipine (NORVASC) 5 MG tablet TK 1 T PO QD 02/17/16   Historical Provider, MD  Calcium Carbonate-Vitamin D (CALTRATE 600+D PO) Take 1 tablet by mouth 2 (two) times daily.     Historical Provider, MD  fluticasone (FLONASE) 50 MCG/ACT nasal spray Place 1 spray into both nostrils at bedtime.     Historical Provider, MD  loperamide (IMODIUM A-D) 2 MG tablet Take 2 mg by mouth 4 (four) times daily as needed for diarrhea or loose stools.    Historical Provider, MD  loratadine (CLARITIN) 10 MG tablet Take 10 mg by mouth daily.    Historical Provider, MD  LORazepam (ATIVAN) 1 MG tablet TK 0.5 T PO BID 03/18/16   Historical Provider, MD  mirtazapine (REMERON) 45 MG tablet Take 45 mg by mouth at bedtime.  10/16/11   Historical Provider, MD  Multiple Vitamins-Minerals (CENTRUM SILVER PO) Take 1 tablet by mouth daily.     Historical Provider, MD  polyethylene glycol powder (GLYCOLAX/MIRALAX) powder Take 8.5 g by mouth at bedtime.  10/16/11   Historical Provider, MD  psyllium (METAMUCIL) 58.6 % packet  Take 0.5 packets by mouth daily.     Historical Provider, MD  quinapril (ACCUPRIL) 40 MG tablet Take 40 mg by mouth Daily.  10/16/11   Historical Provider, MD  SUMAtriptan (IMITREX) 25 MG tablet Take 25 mg by mouth every 2 (two) hours as needed for migraine or headache.     Historical Provider, MD  vancomycin (VANCOCIN) 50 mg/mL oral solution Take 2.5 mLs (125 mg total) by mouth every 6 (six) hours. For 12 more days 03/28/16   Caren Griffins, MD  zolpidem (AMBIEN) 10 MG tablet Take 10 mg by mouth at bedtime as needed for sleep.  09/21/11   Historical Provider, MD    Family History Family History  Problem Relation Age of Onset  . Alzheimer's disease Mother   . Heart failure  Mother   . Heart disease Father   . Heart failure Father   . Other Maternal Grandfather     cerebral hemorrhage  . Skin cancer Brother   . Heart disease Brother     Social History Social History  Substance Use Topics  . Smoking status: Never Smoker  . Smokeless tobacco: Never Used  . Alcohol use 0.0 oz/week     Comment: occasional glass of wine -2 per week     Allergies   Erythromycin and Flagyl [metronidazole]   Review of Systems Review of Systems  Constitutional:       Per HPI, otherwise negative  HENT:       Per HPI, otherwise negative  Respiratory:       Per HPI, otherwise negative  Cardiovascular:       Per HPI, otherwise negative  Gastrointestinal: Positive for diarrhea and nausea.  Endocrine:       Negative aside from HPI  Genitourinary:       Neg aside from HPI   Musculoskeletal:       Per HPI, otherwise negative  Skin: Negative.   Neurological: Positive for weakness. Negative for syncope.     Physical Exam Updated Vital Signs BP 157/77 (BP Location: Left Arm)   Pulse 116   Temp 102.4 F (39.1 C) (Rectal)   Resp 18   SpO2 97%   Physical Exam  Constitutional: She is oriented to person, place, and time. She has a sickly appearance.  HENT:  Head: Normocephalic and atraumatic.  Eyes: Conjunctivae and EOM are normal.  Cardiovascular: Regular rhythm.  Tachycardia present.   Pulmonary/Chest: Effort normal and breath sounds normal. No stridor. No respiratory distress.  Abdominal: Soft. Bowel sounds are normal. She exhibits no distension. There is no tenderness.  Musculoskeletal: She exhibits no edema.  Neurological: She is alert and oriented to person, place, and time. No cranial nerve deficit.  Skin: Skin is warm and dry.  Psychiatric: She has a normal mood and affect.  Nursing note and vitals reviewed.    ED Treatments / Results  Labs (all labs ordered are listed, but only abnormal results are displayed) Labs Reviewed  CBC WITH  DIFFERENTIAL/PLATELET - Abnormal; Notable for the following:       Result Value   WBC 17.1 (*)    RBC 3.74 (*)    Hemoglobin 11.4 (*)    HCT 34.3 (*)    Neutro Abs 15.8 (*)    Lymphs Abs 0.5 (*)    All other components within normal limits  C DIFFICILE QUICK SCREEN W PCR REFLEX  COMPREHENSIVE METABOLIC PANEL  LIPASE, BLOOD  URINALYSIS, ROUTINE W REFLEX MICROSCOPIC (NOT AT [facility])  I-STAT  CG4 LACTIC ACID, ED    Cardiac monitor 121 sinus tach abnormal Pulse ox 98% room air normal Chart review notable for recent admission with C. difficile positive results.   Procedures Procedures (including critical care time)  Medications Ordered in ED Medications  0.9 %  sodium chloride infusion ( Intravenous New Bag/Given 04/18/16 1102)  vancomycin (VANCOCIN) 50 mg/mL oral solution 125 mg (not administered)  ondansetron (ZOFRAN) injection 4 mg (4 mg Intravenous Given 04/18/16 1101)  LORazepam (ATIVAN) injection 0.5 mg (0.5 mg Intravenous Given 04/18/16 1102)  ketorolac (TORADOL) 30 MG/ML injection 15 mg (15 mg Intravenous Given 04/18/16 1135)  sodium chloride 0.9 % bolus 1,000 mL (1,000 mLs Intravenous New Bag/Given 04/18/16 1417)  acetaminophen (TYLENOL) tablet 650 mg (650 mg Oral Given 04/18/16 1417)     Initial Impression / Assessment and Plan / ED Course  I have reviewed the triage vital signs and the nursing notes.  Pertinent labs & imaging results that were available during my care of the patient were reviewed by me and considered in my medical decision making (see chart for details).  Clinical Course    2:51 PM No diarrhea and about 4 hours, patient's vital signs now unremarkable, no tachycardia, blood pressure stable. She continues to complain of mild headache, mild back pain. Patient is tolerating oral intake. Initial labs notable for dehydration, otherwise reassuring. No Clostridium difficile test results yet, as she has had no additional bowel movements.   Patient states that she  was able to tolerate Imodium just prior to ED arrival.  Patient's husband states that he is no unremarkable take care of her at their retirement facility, requests transfer to different unit.  Social worker consultation for assistance.  Final Clinical Impressions(s) / ED Diagnoses  Patient with recent admission for colitis presents with diarrhea, but was able to tolerate Imodium prior to ED arrival, has no additional diarrhea here. Patient does have labs consistent with dehydration, and had empiric initiation of vancomycin oral therapy for presumed C. difficile. Patient's labs was reassuring, vital signs normal, appropriate for outpatient management, but no longer appropriate for her current living situation. After discussion with social worker, the patient Will transition to a skilled nursing division of her residential facility, once lab results of C. difficile is available.   Carmin Muskrat, MD 04/18/16 [phone]

## 2016-04-18 NOTE — Clinical Social Work Note (Signed)
CSW met with patient and her husband at bedside to assess for services.  CSW obtained patient history which included patient and her husband living at [facility].  Patient reported that she had been in hospital in July for c diff and had gone home after observation status.  Patient reported that she was just getting her strength back when this morning she began to have diarrhea.  Patient reported that she is feeling the same symptoms that she felt when she had the c diff but she cannot produce a sample for testing due to the fact that she took Imodium at home and cannot produce any stools at this time.  Patient and husband want to go into the skilled nursing at [facility] and husband stated that he spoke to a nurse this morning regarding patient obtaining a skilled bed before she came into the hospital.  CSW provided education regarding getting skilled bed on the weekends and also provided information regarding patient's insurance.  CSW agreed to follow up with [facility].  CSW called [facility] to assess bed protocol and availability.  CSW spoke with a nurse by the name of Darlin Jerilee Hoh [phone]) who stated that unless patient's paperwork had been done during the week she could not come on the weekend into a skilled bed.  Facility agreed to attempt to contact on call supervisor.    [facility] called back and they stated that patient can have a bed today but they want to know for sure about the Cdiff.  MD was provided with information and agreed to keep patient until she can be tested and to sign the FL2 needed by facility.  Family notified.  Dede Query, LCSW [place] Worker - Weekend Coverage cell #: [phone]

## 2016-04-18 NOTE — NC FL2 (Signed)
[place] LEVEL OF CARE SCREENING TOOL     IDENTIFICATION  Patient Name: Christina Escobar Birthdate: Oct 22, 1929 Sex: female Admission Date (Current Location): 04/18/2016  [facility] and [place] Number:  [place] and Address:  [facility],  [place] [address] [place]      Provider Number:    Attending Physician Name and Address:  Carmin Muskrat, MD  Relative Name and Phone Number:       Current Level of Care: Hospital Recommended Level of Care: [place] Prior Approval Number:    Date Approved/Denied:   PASRR Number:    Discharge Plan: SNF    Current Diagnoses: Patient Active Problem List   Diagnosis Date Noted  . Clostridium difficile diarrhea   . Clostridium difficile infection 03/27/2016  . Antibiotic enterocolitis 03/25/2016  . Migraine 02/28/2013  . Hemorrhoids, external, thrombosed 11/05/2011    Orientation RESPIRATION BLADDER Height & Weight     Self, Time, Situation, Place  Normal Continent Weight:   Height:     BEHAVIORAL SYMPTOMS/MOOD NEUROLOGICAL BOWEL NUTRITION STATUS      Continent    AMBULATORY STATUS COMMUNICATION OF NEEDS Skin   Limited Assist Verbally Normal                       Personal Care Assistance Level of Assistance  Bathing, Dressing Bathing Assistance: Limited assistance   Dressing Assistance: Limited assistance     Functional Limitations Info             SPECIAL CARE FACTORS FREQUENCY                       Contractures      Additional Factors Info  Allergies   Allergies Info: erythromycin Flagyl meloxicam            Current Medications (04/18/2016):  This is the current hospital active medication list Current Facility-Administered Medications  Medication Dose Route Frequency Provider Last Rate Last Dose  . 0.9 %  sodium chloride infusion   Intravenous Continuous Carmin Muskrat, MD 125 mL/hr at 04/18/16 1102    . amLODipine (NORVASC)  tablet 5 mg  5 mg Oral Daily Carmin Muskrat, MD      . lisinopril (PRINIVIL,ZESTRIL) tablet 40 mg  40 mg Oral QHS Carmin Muskrat, MD      . loperamide (IMODIUM) capsule 2 mg  2 mg Oral QID PRN Carmin Muskrat, MD      . loratadine (CLARITIN) tablet 10 mg  10 mg Oral Daily Carmin Muskrat, MD      . LORazepam (ATIVAN) tablet 0.5 mg  0.5 mg Oral Daily Carmin Muskrat, MD      . mirtazapine (REMERON) tablet 45 mg  45 mg Oral QHS Carmin Muskrat, MD      . multivitamin with minerals tablet 1 tablet  1 tablet Oral Daily Carmin Muskrat, MD      . SUMAtriptan (IMITREX) tablet 25 mg  25 mg Oral Q2H PRN Carmin Muskrat, MD      . vancomycin (VANCOCIN) 50 mg/mL oral solution 125 mg  125 mg Oral Q6H Carmin Muskrat, MD      . zolpidem [facility]) tablet 10 mg  10 mg Oral QHS PRN Carmin Muskrat, MD       Current Outpatient Prescriptions  Medication Sig Dispense Refill  . acetaminophen (TYLENOL) 500 MG tablet Take 1,000 mg by mouth 2 (two) times daily.     Marland Kitchen  amLODipine (NORVASC) 5 MG tablet take 5mg s once daily  3  . Calcium Carbonate-Vitamin D (CALTRATE 600+D PO) Take 1 tablet by mouth 2 (two) times daily.     Marland Kitchen denosumab (PROLIA) 60 MG/ML SOLN injection Inject 60 mg into the skin every 6 (six) months.    . loperamide (IMODIUM A-D) 2 MG tablet Take 2 mg by mouth 4 (four) times daily as needed for diarrhea or loose stools.    Marland Kitchen loratadine (CLARITIN) 10 MG tablet Take 10 mg by mouth daily.    Marland Kitchen LORazepam (ATIVAN) 0.5 MG tablet Take 0.5 mg by mouth daily. Take 0.5mg  at 5pm    . LORazepam (ATIVAN) 1 MG tablet Take 1mg  in the morning and evening  5  . mirtazapine (REMERON) 45 MG tablet Take 45 mg by mouth at bedtime.     . Multiple Vitamins-Minerals (CENTRUM SILVER PO) Take 1 tablet by mouth daily.     . quinapril (ACCUPRIL) 40 MG tablet Take 40 mg by mouth Daily.     . SUMAtriptan (IMITREX) 25 MG tablet Take 25 mg by mouth every 2 (two) hours as needed for migraine or headache.     . zolpidem (AMBIEN) 10  MG tablet Take 10 mg by mouth at bedtime as needed for sleep.     . vancomycin (VANCOCIN) 50 mg/mL oral solution Take 2.5 mLs (125 mg total) by mouth every 6 (six) hours. For 12 more days (Patient not taking: Reported on 04/18/2016) 125 mL 0     Discharge Medications: Please see discharge summary for a list of discharge medications.  Relevant Imaging Results:  Relevant Lab Results:   Additional Information    Carlean Jews, LCSW

## 2016-04-18 NOTE — ED Triage Notes (Signed)
She c/o few watery diarrhea stools beginning at ~0400 this morning. She also c/o fever.  She states she  Had this symptomology about a month ago and was dx with c. Diff.

## 2016-04-18 NOTE — Progress Notes (Signed)
Pt state her head hurt a 6/10. Pt was given 15mg  of tordol IV and the room light was shut off. Pts husband remains at the bedside.

## 2016-04-19 LAB — CLOSTRIDIUM DIFFICILE BY PCR: CDIFFPCR: POSITIVE — AB

## 2016-04-19 NOTE — ED Notes (Signed)
[facility] called fro transport, report given to Nurse at [facility] by Allayne Gitelman

## 2016-05-11 ENCOUNTER — Inpatient Hospital Stay (HOSPITAL_BASED_OUTPATIENT_CLINIC_OR_DEPARTMENT_OTHER)
Admission: EM | Admit: 2016-05-11 | Discharge: 2016-05-14 | DRG: 373 | Disposition: A | Discharge status: Home / Self Care | Payer: Medicare Other | Attending: Family Medicine | Admitting: Family Medicine

## 2016-05-11 ENCOUNTER — Encounter (HOSPITAL_BASED_OUTPATIENT_CLINIC_OR_DEPARTMENT_OTHER): Payer: Self-pay | Admitting: *Deleted

## 2016-05-11 DIAGNOSIS — F411 Generalized anxiety disorder: Secondary | ICD-10-CM | POA: Diagnosis present

## 2016-05-11 DIAGNOSIS — Z888 Allergy status to other drugs, medicaments and biological substances status: Secondary | ICD-10-CM | POA: Diagnosis not present

## 2016-05-11 DIAGNOSIS — F329 Major depressive disorder, single episode, unspecified: Secondary | ICD-10-CM | POA: Diagnosis present

## 2016-05-11 DIAGNOSIS — G43909 Migraine, unspecified, not intractable, without status migrainosus: Secondary | ICD-10-CM | POA: Diagnosis present

## 2016-05-11 DIAGNOSIS — A0472 Enterocolitis due to Clostridium difficile, not specified as recurrent: Secondary | ICD-10-CM

## 2016-05-11 DIAGNOSIS — Z79899 Other long term (current) drug therapy: Secondary | ICD-10-CM | POA: Diagnosis not present

## 2016-05-11 DIAGNOSIS — G43709 Chronic migraine without aura, not intractable, without status migrainosus: Secondary | ICD-10-CM | POA: Diagnosis not present

## 2016-05-11 DIAGNOSIS — I10 Essential (primary) hypertension: Secondary | ICD-10-CM | POA: Diagnosis present

## 2016-05-11 DIAGNOSIS — E869 Volume depletion, unspecified: Secondary | ICD-10-CM | POA: Diagnosis not present

## 2016-05-11 DIAGNOSIS — A047 Enterocolitis due to Clostridium difficile: Principal | ICD-10-CM | POA: Diagnosis present

## 2016-05-11 DIAGNOSIS — R197 Diarrhea, unspecified: Secondary | ICD-10-CM | POA: Diagnosis present

## 2016-05-11 DIAGNOSIS — A0471 Enterocolitis due to Clostridium difficile, recurrent: Secondary | ICD-10-CM | POA: Diagnosis present

## 2016-05-11 LAB — COMPREHENSIVE METABOLIC PANEL
ALBUMIN: 4.6 g/dL (ref 3.5–5.0)
ALK PHOS: 61 U/L (ref 38–126)
ALT: 23 U/L (ref 14–54)
AST: 32 U/L (ref 15–41)
Anion gap: 10 (ref 5–15)
BILIRUBIN TOTAL: 0.5 mg/dL (ref 0.3–1.2)
BUN: 20 mg/dL (ref 6–20)
CO2: 27 mmol/L (ref 22–32)
CREATININE: 0.93 mg/dL (ref 0.44–1.00)
Calcium: 10.1 mg/dL (ref 8.9–10.3)
Chloride: 100 mmol/L — ABNORMAL LOW (ref 101–111)
GFR calc Af Amer: >60 mL/min (ref >60)
GFR, EST NON AFRICAN AMERICAN: 55 mL/min — AB (ref >60)
GLUCOSE: 131 mg/dL — AB (ref 65–99)
Potassium: 3.8 mmol/L (ref 3.5–5.1)
Sodium: 137 mmol/L (ref 135–145)
TOTAL PROTEIN: 7.8 g/dL (ref 6.5–8.1)

## 2016-05-11 LAB — CBC WITH DIFFERENTIAL/PLATELET
BASOS ABS: 0 10*3/uL (ref 0.0–0.1)
Basophils Relative: 0 %
EOS ABS: 0 10*3/uL (ref 0.0–0.7)
EOS PCT: 0 %
HCT: 36.1 % (ref 36.0–46.0)
Hemoglobin: 12.1 g/dL (ref 12.0–15.0)
LYMPHS PCT: 7 %
Lymphs Abs: 1.3 10*3/uL (ref 0.7–4.0)
MCH: 31.1 pg (ref 26.0–34.0)
MCHC: 33.5 g/dL (ref 30.0–36.0)
MCV: 92.8 fL (ref 78.0–100.0)
MONO ABS: 1.3 10*3/uL — AB (ref 0.1–1.0)
Monocytes Relative: 8 %
Neutro Abs: 14.4 10*3/uL — ABNORMAL HIGH (ref 1.7–7.7)
Neutrophils Relative %: 85 %
PLATELETS: 303 10*3/uL (ref 150–400)
RBC: 3.89 MIL/uL (ref 3.87–5.11)
RDW: 12.7 % (ref 11.5–15.5)
WBC: 17 10*3/uL — AB (ref 4.0–10.5)

## 2016-05-11 LAB — C DIFFICILE QUICK SCREEN W PCR REFLEX
C DIFFICLE (CDIFF) ANTIGEN: POSITIVE — AB
C Diff interpretation: DETECTED
C Diff toxin: POSITIVE — AB

## 2016-05-11 MED ORDER — ONDANSETRON HCL 4 MG PO TABS: 4.0000 mg | ORAL_TABLET | Freq: Four times a day (QID) | ORAL | Status: DC | PRN

## 2016-05-11 MED ORDER — RISAQUAD PO CAPS
1.0000 | ORAL_CAPSULE | Freq: Every day | ORAL | Status: DC
  Administered 2016-05-12 – 2016-05-14 (×3): 1 via ORAL
  Filled 2016-05-11 (×3): qty 1

## 2016-05-11 MED ORDER — VANCOMYCIN 50 MG/ML ORAL SOLUTION: 125.0000 mg | Freq: Two times a day (BID) | ORAL | Status: DC

## 2016-05-11 MED ORDER — LORAZEPAM 0.5 MG PO TABS
0.5000 mg | ORAL_TABLET | Freq: Every evening | ORAL | Status: DC
  Administered 2016-05-12 – 2016-05-13 (×2): 0.5 mg via ORAL
  Filled 2016-05-11 (×2): qty 1

## 2016-05-11 MED ORDER — VANCOMYCIN 50 MG/ML ORAL SOLUTION
125.0000 mg | Freq: Four times a day (QID) | ORAL | Status: DC
  Filled 2016-05-11 (×3): qty 2.5

## 2016-05-11 MED ORDER — LORAZEPAM 1 MG PO TABS
1.0000 mg | ORAL_TABLET | Freq: Once | ORAL | Status: AC
  Administered 2016-05-11: 1 mg via ORAL
  Filled 2016-05-11: qty 1

## 2016-05-11 MED ORDER — LORAZEPAM 1 MG PO TABS
1.0000 mg | ORAL_TABLET | Freq: Two times a day (BID) | ORAL | Status: DC
  Administered 2016-05-12 – 2016-05-14 (×5): 1 mg via ORAL
  Filled 2016-05-11 (×5): qty 1

## 2016-05-11 MED ORDER — VANCOMYCIN 50 MG/ML ORAL SOLUTION: 125.0000 mg | Freq: Every day | ORAL | Status: DC

## 2016-05-11 MED ORDER — SODIUM CHLORIDE 0.9 % IV BOLUS (SEPSIS)
500.0000 mL | Freq: Once | INTRAVENOUS | Status: AC
  Administered 2016-05-11: 500 mL via INTRAVENOUS

## 2016-05-11 MED ORDER — SODIUM CHLORIDE 0.9 % IV SOLN
INTRAVENOUS | Status: DC
  Administered 2016-05-11 – 2016-05-13 (×3): via INTRAVENOUS

## 2016-05-11 MED ORDER — ENOXAPARIN SODIUM 40 MG/0.4ML ~~LOC~~ SOLN
40.0000 mg | Freq: Every day | SUBCUTANEOUS | Status: DC
  Administered 2016-05-11 – 2016-05-13 (×3): 40 mg via SUBCUTANEOUS
  Filled 2016-05-11 (×3): qty 0.4

## 2016-05-11 MED ORDER — ZOLPIDEM TARTRATE 5 MG PO TABS
5.0000 mg | ORAL_TABLET | Freq: Every evening | ORAL | Status: DC | PRN
  Administered 2016-05-12 – 2016-05-13 (×2): 5 mg via ORAL
  Filled 2016-05-11 (×2): qty 1

## 2016-05-11 MED ORDER — MIRTAZAPINE 15 MG PO TABS
45.0000 mg | ORAL_TABLET | Freq: Every day | ORAL | Status: DC
  Administered 2016-05-12 – 2016-05-13 (×2): 45 mg via ORAL
  Filled 2016-05-11 (×2): qty 3

## 2016-05-11 MED ORDER — AMLODIPINE BESYLATE 5 MG PO TABS
5.0000 mg | ORAL_TABLET | Freq: Every day | ORAL | Status: DC
  Administered 2016-05-12 – 2016-05-14 (×3): 5 mg via ORAL
  Filled 2016-05-11 (×3): qty 1

## 2016-05-11 MED ORDER — QUINAPRIL HCL 10 MG PO TABS: 40.0000 mg | ORAL_TABLET | Freq: Every day | ORAL | Status: DC

## 2016-05-11 MED ORDER — VANCOMYCIN 50 MG/ML ORAL SOLUTION: 125.0000 mg | ORAL | Status: DC

## 2016-05-11 MED ORDER — LORATADINE 10 MG PO TABS
10.0000 mg | ORAL_TABLET | Freq: Every day | ORAL | Status: DC
  Administered 2016-05-12 – 2016-05-14 (×3): 10 mg via ORAL
  Filled 2016-05-11 (×3): qty 1

## 2016-05-11 MED ORDER — MIRTAZAPINE 15 MG PO TABS
45.0000 mg | ORAL_TABLET | Freq: Once | ORAL | Status: AC
  Administered 2016-05-11: 45 mg via ORAL
  Filled 2016-05-11: qty 3

## 2016-05-11 MED ORDER — ACETAMINOPHEN 325 MG PO TABS
650.0000 mg | ORAL_TABLET | Freq: Four times a day (QID) | ORAL | Status: DC | PRN
  Administered 2016-05-11 – 2016-05-13 (×4): 650 mg via ORAL
  Filled 2016-05-11 (×4): qty 2

## 2016-05-11 MED ORDER — VANCOMYCIN 50 MG/ML ORAL SOLUTION
125.0000 mg | Freq: Four times a day (QID) | ORAL | Status: DC
  Administered 2016-05-11 – 2016-05-14 (×11): 125 mg via ORAL
  Filled 2016-05-11 (×15): qty 2.5

## 2016-05-11 MED ORDER — ACETAMINOPHEN 650 MG RE SUPP: 650.0000 mg | Freq: Four times a day (QID) | RECTAL | Status: DC | PRN

## 2016-05-11 MED ORDER — ZOLPIDEM TARTRATE 5 MG PO TABS
5.0000 mg | ORAL_TABLET | Freq: Once | ORAL | Status: AC
  Administered 2016-05-11: 5 mg via ORAL
  Filled 2016-05-11: qty 1

## 2016-05-11 MED ORDER — ONDANSETRON HCL 4 MG/2ML IJ SOLN
4.0000 mg | Freq: Four times a day (QID) | INTRAMUSCULAR | Status: DC | PRN
  Filled 2016-05-11: qty 2

## 2016-05-11 NOTE — ED Notes (Signed)
Trying to call report to RN at [facility] placed on hold now hanging up.

## 2016-05-11 NOTE — ED Notes (Signed)
I left a stool specimen in lab that pt collected at home prior to coming to the ED. It was soft, formed, green in color.

## 2016-05-11 NOTE — ED Notes (Signed)
Earlier Pt. Asked if she could take her own imitrex and xanax

## 2016-05-11 NOTE — H&P (Signed)
History and Physical  Patient Name: Christina Escobar     [id number]    DOB: [birth year]    DOA: 05/11/2016 PCP: Haywood Pao, MD   Patient coming from: [facility]  Chief Complaint: Diarrhea  HPI: Christina Escobar is a 82 y.o. female with a past medical history significant for HTN, migraines, and recent Cdiff and recurrence who presents with recurrent diarrhea.  The patient was in her usual state of health until July after a course of cefdinir for an ear infection, she developed frequent loose bowel movements.  Was admitted here, C diff toxin positive, treated with oral vancomycin, her symptoms resolved, and she was discharged to complete a 14 day course.  Completed this, symptoms resolved, then about a week after finishing vancomycin, she had sudden onset of frequent BMs again, was seen in the ER, again given oral vancomycin for 14 days.  Went home to the Rehab section of [facility] that time for 10 days, feels that this time her oral vancomycin was refridgerated and shaken before use (she hadn't known to do that). Again symptoms resolved.    Again she was fine after completing vancomycin for about a week, until today. Went for walk with dog and husband this morning, then when she came home, had 12 bowel movements, softer and softer, but never watery.  Thinks probably 12 BMs.  Had perhaps chills, low grade fever, malaise, then came to the ER.  ED course: -Temp 99.64F, heart rate 100s, respirations and BP and O2 saturation normal -Na 137, K 3.8, Cr 0.93 (baseline), WBC 17K, Hgb 12.1 -C diff toxin again positive -Case was discussed with Dr. Silverio Decamp who recommended admission and prolonged oral vancomycin taper and TRH were asked to admit for IV fluids     ROS: Review of Systems  Constitutional: Positive for chills and weight loss. Negative for fever and malaise/fatigue.  Gastrointestinal: Positive for diarrhea. Negative for abdominal pain, blood in stool,  constipation, melena, nausea and vomiting.  Musculoskeletal: Positive for back pain. Negative for falls, joint pain, myalgias and neck pain.  Neurological: Negative for loss of consciousness, weakness and headaches.  All other systems reviewed and are negative. Lost six pounds this summer.      Past Medical History:  Diagnosis Date  . Allergy   . Anal fissure   . Anxiety   . Basal cell carcinoma   . Cataracts, bilateral   . Clostridium difficile diarrhea   . Depression   . Diverticulosis    hx of stricture in colon  . Hemorrhoids   . Hypertension   . IBS (irritable bowel syndrome)   . Insomnia   . Migraine   . Osteopenia   . Thrombosed hemorrhoids     Past Surgical History:  Procedure Laterality Date  . APPENDECTOMY    . BASAL CELL CARCINOMA EXCISION  [id number]  . CATARACT EXTRACTION, BILATERAL Bilateral 1999  . COLON RESECTION  2001  . INCISION AND DRAINAGE  11/05/2011   thrombosed hemorrhoid  . TRANSANAL EXCISION OF RECTAL MASS WITH HEMORRHOID INJECTION  2007   anal fissure    Social History: Patient lives with her husband in the independent living section of [place].  The patient walks unassisted.  She has no dementia.  She is not a smoker.    Allergies  Allergen Reactions  . Erythromycin Nausea And Vomiting  . Flagyl [Metronidazole] Diarrhea  . Meloxicam Other (See Comments)    Reaction:  Dizziness     Family history:  family history includes Alzheimer's disease in her mother; Heart disease in her brother and father; Heart failure in her father and mother; Other in her maternal grandfather; Skin cancer in her brother.  Prior to Admission medications   Medication Sig Start Date End Date Taking? Authorizing Provider  acetaminophen (TYLENOL) 500 MG tablet Take 1,000 mg by mouth 2 (two) times daily.    Yes Historical Provider, MD  acidophilus (RISAQUAD) CAPS capsule Take 1 capsule by mouth daily.   Yes Historical Provider, MD  amLODipine (NORVASC) 5 MG  tablet Take 5 mg by mouth daily.   Yes Historical Provider, MD  Calcium Carbonate-Vitamin D3 (CALCIUM 600-D) 600-400 MG-UNIT TABS Take 1 tablet by mouth 2 (two) times daily with breakfast and lunch.   Yes Historical Provider, MD  denosumab (PROLIA) 60 MG/ML SOLN injection Inject 60 mg into the skin every 6 (six) months.   Yes Historical Provider, MD  loratadine (CLARITIN) 10 MG tablet Take 10 mg by mouth daily.   Yes Historical Provider, MD  LORazepam (ATIVAN) 0.5 MG tablet Take 0.5 mg by mouth every evening. Pt takes this dose at 5pm.   Yes Historical Provider, MD  LORazepam (ATIVAN) 1 MG tablet Take 1 mg by mouth 2 (two) times daily at 8 am and 10 pm.   Yes Historical Provider, MD  mirtazapine (REMERON) 45 MG tablet Take 45 mg by mouth at bedtime.    Yes Historical Provider, MD  Multiple Vitamin (MULTIVITAMIN WITH MINERALS) TABS tablet Take 1 tablet by mouth daily.   Yes Historical Provider, MD  psyllium (HYDROCIL/METAMUCIL) 95 % PACK Take 0.5 packets by mouth at bedtime.   Yes Historical Provider, MD  quinapril (ACCUPRIL) 40 MG tablet Take 40 mg by mouth Daily.    Yes Historical Provider, MD  SUMAtriptan (IMITREX) 25 MG tablet Take 25 mg by mouth every 2 (two) hours as needed for migraine.    Yes Historical Provider, MD  zolpidem (AMBIEN) 10 MG tablet Take 10 mg by mouth at bedtime as needed for sleep.    Yes Historical Provider, MD  vancomycin (VANCOCIN) 50 mg/mL oral solution Take 2.5 mLs (125 mg total) by mouth every 6 (six) hours. For 12 more days Patient not taking: Reported on 04/18/2016 03/28/16   Caren Griffins, MD       Physical Exam: BP (!) 151/66 (BP Location: Left Arm)   Pulse 92   Temp 98.5 F (36.9 C) (Oral)   Resp 20   Ht 5\' 2"  (1.575 m)   Wt 48.1 kg (106 lb)   SpO2 94%   BMI 19.39 kg/m  General appearance: Thin elderly adult female, alert and in no acute distress.   Eyes: Anicteric, conjunctiva pink, lids and lashes normal.     ENT: No nasal deformity, discharge, or  epistaxis.  OP moist without lesions.   Lymph: No cervical or supraclavicular lymphadenopathy.  There is a fatty nodule in the left neck, old. Skin: Warm and dry.  No jaundice.  No suspicious rashes or lesions. Cardiac: Tachycardic, regular, nl S1-S2, no murmurs appreciated.  Capillary refill is brisk.  JVP normal.  No LE edema.  Radial and DP pulses 2+ and symmetric. Respiratory: Normal respiratory rate and rhythm.  CTAB without rales or wheezes. GI: Abdomen soft without rigidity.  No TTP or guarding or rebound. No ascites, distension, hepatosplenomegaly.   MSK: No deformities or effusions.  No clubbing/cyanosis. Neuro: Cranial nerves normal. Sensorium intact and responding to questions, attention normal.  Speech is fluent.  Moves all extremities equally and with normal coordination.    Psych: Affect normal.  Judgment and insight appear normal.       Labs on Admission:  I have personally reviewed following labs and imaging studies: CBC:  Recent Labs Lab 05/11/16 1615  WBC 17.0*  NEUTROABS 14.4*  HGB 12.1  HCT 36.1  MCV 92.8  PLT [id number]   Basic Metabolic Panel:  Recent Labs Lab 05/11/16 1615  NA 137  K 3.8  CL 100*  CO2 27  GLUCOSE 131*  BUN 20  CREATININE 0.93  CALCIUM 10.1   GFR: Estimated Creatinine Clearance: 34.8 mL/min (by C-G formula based on SCr of 0.93 mg/dL).  Liver Function Tests:  Recent Labs Lab 05/11/16 1615  AST 32  ALT 23  ALKPHOS 61  BILITOT 0.5  PROT 7.8  ALBUMIN 4.6    Recent Results (from the past 240 hour(s))  C difficile quick scan w PCR reflex     Status: Abnormal   Collection Time: 05/11/16  3:00 PM  Result Value Ref Range Status   C Diff antigen POSITIVE (A) NEGATIVE Final   C Diff toxin POSITIVE (A) NEGATIVE Final   C Diff interpretation Toxin producing C. difficile detected.  Final    Comment: CRITICAL RESULT CALLED TO, READ BACK BY AND VERIFIED WITHAllegra Grana RN [id number] 05/11/16 A BROWNING Performed at [facility]            Radiological Exams on Admission: Personally reviewed: No results found.      Assessment/Plan 1. C diff colitis:  Patient presents with recurrent frequent BM, tachycardia, low grade temp, and leukocytosis.  Cdiff toxin present in stool.   -IVF at 75 cc/hr -Continue home probiotic -Oral vancomycin prolonged taper per orderset -Consult to GI, appreciate cares   2. HTN:  -Continue home quinapril and amlodipine  3. Anxiety:  -Continue home mirtazapine, lorazepam -Continue home Ambien, dose down to 5 mg nightly given age  55. Migraine:  On Imitrex PRN at home  5. Other medications: -Continue home loratadine      DVT prophylaxis: Lovenox  Code Status: FULL  Family Communication: None present  Disposition Plan: Anticipate IV fluids, oral vancomycin, GI consultation.  Further disposition pending improvement in vital signs, GI recommendations. Consults called: GI, Dr. Silverio Decamp Admission status: INPATIENT, med surg    Medical decision making: Patient seen at 10:24 PM on 05/11/2016.  The patient was discussed with Dr. Myna Hidalgo. What exists of the patient's chart was reviewed in depth.  Clinical condition: stable.        Edwin Dada [facility] Pager [phone]

## 2016-05-11 NOTE — ED Notes (Signed)
In to assess Pt. And start IV ... Pt. Reports need to go to restroom for BM need.  RN  To assist Pt. To rest room

## 2016-05-11 NOTE — ED Provider Notes (Signed)
[facility] DEPT [facility] Provider Note   CSN: [id number] Arrival date & time: 05/11/16  1454  By signing my name below, I, Shanna Cisco, attest that this documentation has been prepared under the direction and in the presence of Malvin Johns, MD. Electronically Signed: Shanna Cisco, ED Scribe. 05/11/16. 3:46 PM.   History   Chief Complaint Chief Complaint  Patient presents with  . Diarrhea   The history is provided by the patient and the spouse. No language interpreter was used.   HPI Comments:  Christina Escobar is a 82 y.o. female with a history of clostridium difficile diarrhea and infection who presents to the Emergency Department complaining of diarrhea, which started this morning. Pt reports that the first episode occurred 6 weeks ago and she was hospitalized for 3 days, but improved after Vancomycin. Symptoms returned early August and she took another 14 day course of Vancomycin. It has been 10 days since last dose and pt feels symptoms returning. Associated symptoms includes abdominal pain with BM in the morning, chills and decreased appetite. Denies nausea, vomiting, fever, cough and blood in stool.   Past Medical History:  Diagnosis Date  . Allergy   . Anal fissure   . Anxiety   . Basal cell carcinoma   . Cataracts, bilateral   . Clostridium difficile diarrhea   . Depression   . Diverticulosis    hx of stricture in colon  . Hemorrhoids   . Hypertension   . IBS (irritable bowel syndrome)   . Insomnia   . Migraine   . Osteopenia   . Thrombosed hemorrhoids     Patient Active Problem List   Diagnosis Date Noted  . C. difficile colitis 05/11/2016  . Essential hypertension 05/11/2016  . Anxiety state 05/11/2016  . Clostridium difficile diarrhea   . Clostridium difficile infection 03/27/2016  . Antibiotic enterocolitis 03/25/2016  . Migraine 02/28/2013  . Hemorrhoids, external, thrombosed 11/05/2011    Past Surgical History:  Procedure Laterality Date  .  APPENDECTOMY    . BASAL CELL CARCINOMA EXCISION  [id number]  . CATARACT EXTRACTION, BILATERAL Bilateral 1999  . COLON RESECTION  2001  . INCISION AND DRAINAGE  11/05/2011   thrombosed hemorrhoid  . TRANSANAL EXCISION OF RECTAL MASS WITH HEMORRHOID INJECTION  2007   anal fissure    OB History    No data available       Home Medications    Prior to Admission medications   Medication Sig Start Date End Date Taking? Authorizing Provider  acetaminophen (TYLENOL) 500 MG tablet Take 1,000 mg by mouth 2 (two) times daily.    Yes Historical Provider, MD  acidophilus (RISAQUAD) CAPS capsule Take 1 capsule by mouth daily.   Yes Historical Provider, MD  amLODipine (NORVASC) 5 MG tablet Take 5 mg by mouth daily.   Yes Historical Provider, MD  Calcium Carbonate-Vitamin D3 (CALCIUM 600-D) 600-400 MG-UNIT TABS Take 1 tablet by mouth 2 (two) times daily with breakfast and lunch.   Yes Historical Provider, MD  denosumab (PROLIA) 60 MG/ML SOLN injection Inject 60 mg into the skin every 6 (six) months.   Yes Historical Provider, MD  loratadine (CLARITIN) 10 MG tablet Take 10 mg by mouth daily.   Yes Historical Provider, MD  LORazepam (ATIVAN) 0.5 MG tablet Take 0.5 mg by mouth every evening. Pt takes this dose at 5pm.   Yes Historical Provider, MD  LORazepam (ATIVAN) 1 MG tablet Take 1 mg by mouth 2 (two) times daily at 8 am  and 10 pm.   Yes Historical Provider, MD  mirtazapine (REMERON) 45 MG tablet Take 45 mg by mouth at bedtime.    Yes Historical Provider, MD  Multiple Vitamin (MULTIVITAMIN WITH MINERALS) TABS tablet Take 1 tablet by mouth daily.   Yes Historical Provider, MD  psyllium (HYDROCIL/METAMUCIL) 95 % PACK Take 0.5 packets by mouth at bedtime.   Yes Historical Provider, MD  quinapril (ACCUPRIL) 40 MG tablet Take 40 mg by mouth Daily.    Yes Historical Provider, MD  SUMAtriptan (IMITREX) 25 MG tablet Take 25 mg by mouth every 2 (two) hours as needed for migraine.    Yes Historical Provider, MD    zolpidem (AMBIEN) 10 MG tablet Take 10 mg by mouth at bedtime as needed for sleep.    Yes Historical Provider, MD  vancomycin (VANCOCIN) 50 mg/mL oral solution Take 2.5 mLs (125 mg total) by mouth every 6 (six) hours. For 12 more days Patient not taking: Reported on 04/18/2016 03/28/16   Caren Griffins, MD    Family History Family History  Problem Relation Age of Onset  . Alzheimer's disease Mother   . Heart failure Mother   . Heart disease Father   . Heart failure Father   . Other Maternal Grandfather     cerebral hemorrhage  . Skin cancer Brother   . Heart disease Brother     Social History Social History  Substance Use Topics  . Smoking status: Never Smoker  . Smokeless tobacco: Never Used  . Alcohol use 0.0 oz/week     Comment: occasional glass of wine -2 per week     Allergies   Erythromycin; Flagyl [metronidazole]; and Meloxicam   Review of Systems Review of Systems  Constitutional: Positive for appetite change and chills. Negative for fever.  Respiratory: Negative for cough.   Gastrointestinal: Positive for abdominal pain and diarrhea. Negative for blood in stool, nausea and vomiting.  All other systems reviewed and are negative.    Physical Exam Updated Vital Signs BP (!) 151/66 (BP Location: Left Arm)   Pulse 92   Temp 98.5 F (36.9 C) (Oral)   Resp 20   Ht 5\' 2"  (1.575 m)   Wt 106 lb (48.1 kg)   SpO2 94%   BMI 19.39 kg/m   Physical Exam  Constitutional: She is oriented to person, place, and time. She appears well-developed and well-nourished.  HENT:  Head: Normocephalic and atraumatic.  Slightly dry mucous membranes  Eyes: Pupils are equal, round, and reactive to light.  Neck: Normal range of motion. Neck supple.  Cardiovascular: Normal rate, regular rhythm and normal heart sounds.   Mild tachycardia  Pulmonary/Chest: Effort normal and breath sounds normal. No respiratory distress. She has no wheezes. She has no rales. She exhibits no  tenderness.  Abdominal: Soft. Bowel sounds are normal. There is no tenderness. There is no rebound and no guarding.  Musculoskeletal: Normal range of motion. She exhibits no edema.  Lymphadenopathy:    She has no cervical adenopathy.  Neurological: She is alert and oriented to person, place, and time.  Skin: Skin is warm and dry. No rash noted.  Psychiatric: She has a normal mood and affect.    ED Treatments / Results  DIAGNOSTIC STUDIES:  Oxygen Saturation is 99% on room air, normal by my interpretation.    COORDINATION OF CARE:  3:29 PM Discussed treatment plan with pt at bedside, which includes IV fluid, blood work, and following up with GI, and pt agreed to  plan.  Labs (all labs ordered are listed, but only abnormal results are displayed) Labs Reviewed  C DIFFICILE QUICK SCREEN W PCR REFLEX - Abnormal; Notable for the following:       Result Value   C Diff antigen POSITIVE (*)    C Diff toxin POSITIVE (*)    All other components within normal limits  COMPREHENSIVE METABOLIC PANEL - Abnormal; Notable for the following:    Chloride 100 (*)    Glucose, Bld 131 (*)    GFR calc non Af Amer 55 (*)    All other components within normal limits  CBC WITH DIFFERENTIAL/PLATELET - Abnormal; Notable for the following:    WBC 17.0 (*)    Neutro Abs 14.4 (*)    Monocytes Absolute 1.3 (*)    All other components within normal limits  BASIC METABOLIC PANEL  CBC    EKG  EKG Interpretation None       Radiology No results found.  Procedures Procedures (including critical care time)  Medications Ordered in ED Medications  vancomycin (VANCOCIN) 50 mg/mL oral solution 125 mg (not administered)    Followed by  vancomycin (VANCOCIN) 50 mg/mL oral solution 125 mg (not administered)    Followed by  vancomycin (VANCOCIN) 50 mg/mL oral solution 125 mg (not administered)    Followed by  vancomycin (VANCOCIN) 50 mg/mL oral solution 125 mg (not administered)    Followed by    vancomycin (VANCOCIN) 50 mg/mL oral solution 125 mg (not administered)  enoxaparin (LOVENOX) injection 40 mg (not administered)  0.9 %  sodium chloride infusion (not administered)  acetaminophen (TYLENOL) tablet 650 mg (not administered)    Or  acetaminophen (TYLENOL) suppository 650 mg (not administered)  ondansetron (ZOFRAN) tablet 4 mg (not administered)    Or  ondansetron (ZOFRAN) injection 4 mg (not administered)  zolpidem (AMBIEN) tablet 5 mg (not administered)  mirtazapine (REMERON) tablet 45 mg (not administered)  LORazepam (ATIVAN) tablet 1 mg (not administered)  sodium chloride 0.9 % bolus 500 mL (0 mLs Intravenous Stopped 05/11/16 1822)     Initial Impression / Assessment and Plan / ED Course  I have reviewed the triage vital signs and the nursing notes.  Pertinent labs & imaging results that were available during my care of the patient were reviewed by me and considered in my medical decision making (see chart for details).  Clinical Course    Pt with probable 3rd Recurrence of C. difficile in the last 6 weeks. Her white count is 17,000. She is mildly tachycardic. I spoke with Dr. Nyoka Cowden with GI.  We decided it might be best if we admitted her to the hospital for hydration and further therapy. She recommends a prolonged, taper course of vancomycin. She also recommends starting probiotics and possible consult for a fecal transplant. I will consult the hospitalist for admission.  I spoke with Dr. Myna Hidalgo who has accepted the pt for transfer to [facility].  Final Clinical Impressions(s) / ED Diagnoses   Final diagnoses:  C. difficile diarrhea    New Prescriptions Current Discharge Medication List    I personally performed the services described in this documentation, which was scribed in my presence.  The recorded information has been reviewed and considered.     Malvin Johns, MD 05/11/16 [phone]

## 2016-05-11 NOTE — ED Triage Notes (Signed)
Diarrhea. She has a hx of diarrhea and feels she is having it again 10 days after taking Vancomycin for same.

## 2016-05-11 NOTE — Progress Notes (Signed)
Accepted to med-surg bed at [facility] under inpatient status from [facility] for C diff recurrence. Developed C diff in July after abx for sinusitis. Relapsed and completed a second round of PO vanc for 14 days, completed 10 days ago. Now has recurrent diarrhea with WBC back up to 17 and dehydration. Dr. Tamera Punt discussed with Dr. Silverio Decamp and admission for hydration and GI consult. Dr. Silverio Decamp relates that she will likely need prolonged course of oral vanc with taper, and maybe fecal transplant.

## 2016-05-12 DIAGNOSIS — E869 Volume depletion, unspecified: Secondary | ICD-10-CM

## 2016-05-12 DIAGNOSIS — I10 Essential (primary) hypertension: Secondary | ICD-10-CM

## 2016-05-12 LAB — BASIC METABOLIC PANEL
Anion gap: 7 (ref 5–15)
BUN: 13 mg/dL (ref 6–20)
CHLORIDE: 106 mmol/L (ref 101–111)
CO2: 26 mmol/L (ref 22–32)
Calcium: 9.2 mg/dL (ref 8.9–10.3)
Creatinine, Ser: 0.99 mg/dL (ref 0.44–1.00)
GFR, EST AFRICAN AMERICAN: 59 mL/min — AB (ref >60)
GFR, EST NON AFRICAN AMERICAN: 51 mL/min — AB (ref >60)
Glucose, Bld: 110 mg/dL — ABNORMAL HIGH (ref 65–99)
POTASSIUM: 3.6 mmol/L (ref 3.5–5.1)
SODIUM: 139 mmol/L (ref 135–145)

## 2016-05-12 LAB — CBC
HEMATOCRIT: 34.3 % — AB (ref 36.0–46.0)
Hemoglobin: 11.4 g/dL — ABNORMAL LOW (ref 12.0–15.0)
MCH: 30.5 pg (ref 26.0–34.0)
MCHC: 33.2 g/dL (ref 30.0–36.0)
MCV: 91.7 fL (ref 78.0–100.0)
PLATELETS: 278 10*3/uL (ref 150–400)
RBC: 3.74 MIL/uL — AB (ref 3.87–5.11)
RDW: 13.3 % (ref 11.5–15.5)
WBC: 12.3 10*3/uL — AB (ref 4.0–10.5)

## 2016-05-12 MED ORDER — LISINOPRIL 20 MG PO TABS
40.0000 mg | ORAL_TABLET | Freq: Every day | ORAL | Status: DC
  Administered 2016-05-12 – 2016-05-14 (×3): 40 mg via ORAL
  Filled 2016-05-12 (×3): qty 2

## 2016-05-12 NOTE — Progress Notes (Signed)
PROGRESS NOTE    Christina Escobar  [id number] DOB: [birth year] DOA: 05/11/2016 PCP: Haywood Pao, MD  Outpatient Specialists:     Brief Narrative: 82 y.o. female with a past medical history significant for HTN, migraines, and recent Cdiff and recurrence who presents with recurrent diarrhea.  The patient was in her usual state of health until July after a course of cefdinir for an ear infection, she developed frequent loose bowel movements.  Was admitted here, C diff toxin positive, treated with oral vancomycin, her symptoms resolved, and she was discharged to complete a 14 day course.  Completed this, symptoms resolved, then about a week after finishing vancomycin, she had sudden onset of frequent BMs again, was seen in the ER, again given oral vancomycin for 14 days.  Went home to the Rehab section of [facility] that time for 10 days, feels that this time her oral vancomycin was refridgerated and shaken before use (she hadn't known to do that). Again symptoms resolved.    Again she was fine after completing vancomycin for about a week, until today. Went for walk with dog and husband this morning, then when she came home, had 12 bowel movements, softer and softer, but never watery.  Thinks probably 12 BMs.  Had perhaps chills, low grade fever, malaise, then came to the ER.  ED course: -Temp 99.81F, heart rate 100s, respirations and BP and O2 saturation normal -Na 137, K 3.8, Cr 0.93 (baseline), WBC 17K, Hgb 12.1 -C diff toxin again positive -Case was discussed with Dr. Silverio Decamp who recommended admission and prolonged oral vancomycin taper and TRH were asked to admit for IV fluids.  Assessment & Plan:   Principal Problem:   C. difficile colitis Active Problems:   Migraine   Essential hypertension   Anxiety state  1. C diff colitis: Continue oral Vancomycin. Still having frequent diarrhea. However, leukocytosis is resolving. Broached option of stool transplant, should that  become necessary and patient has already read up on the option. - Continue IVF. - Await GI input - Monitor renal function and electrolytes.   2. HTN:  -Continue home quinapril and amlodipine  3. Anxiety:  -Continue home mirtazapine, lorazepam -Continue home Ambien, dose down to 5 mg nightly given age  34. Migraine:  On Imitrex PRN at home  5. Other medications: -Continue home loratadine  6. Volume Depletion - Continue IVF  DVT prophylaxis: Lovenox  Code Status: FULL  Family Communication:  Disposition Plan: Home eventually. Consults called: GI, Dr. Silverio Decamp  Procedures:   None  Antimicrobials:   Oral Vancomycin  (Documented allergy to Metronidazole)    Subjective: Still having frequent watery diarrhea. No fever or chills. No abdominal pain. No nausea or vomiting.  Objective: Vitals:   05/11/16 1503 05/11/16 1927 05/11/16 2109 05/12/16 0610  BP: 163/73 148/70 (!) 151/66 (!) 146/64  Pulse: 102 94 92 92  Resp: 20 20 20 20   Temp: 99.2 F (37.3 C) 98.9 F (37.2 C) 98.5 F (36.9 C) 98 F (36.7 C)  TempSrc: Oral Oral Oral Oral  SpO2: 99% 95% 94% 98%  Weight:      Height:        Intake/Output Summary (Last 24 hours) at 05/12/16 0914 Last data filed at 05/12/16 0700  Gross per 24 hour  Intake              620 ml  Output                0 ml  Net  620 ml   Filed Weights   05/11/16 1459  Weight: 48.1 kg (106 lb)    Examination:  General exam: Appears calm and comfortable  Respiratory system: Clear to auscultation.  Cardiovascular system: S1 & S2. Gastrointestinal system: Abdomen is minimally distended, soft and nontender. Organs were not palpated. Normal bowel sounds heard. Central nervous system: Alert and oriented. Moves all limbs. Extremities: No leg edema.   Data Reviewed: I have personally reviewed following labs and imaging studies  CBC:  Recent Labs Lab 05/11/16 1615 05/12/16 0531  WBC 17.0* 12.3*  NEUTROABS 14.4*  --    HGB 12.1 11.4*  HCT 36.1 34.3*  MCV 92.8 91.7  PLT 303 0000000   Basic Metabolic Panel:  Recent Labs Lab 05/11/16 1615 05/12/16 0531  NA 137 139  K 3.8 3.6  CL 100* 106  CO2 27 26  GLUCOSE 131* 110*  BUN 20 13  CREATININE 0.93 0.99  CALCIUM 10.1 9.2   GFR: Estimated Creatinine Clearance: 32.7 mL/min (by C-G formula based on SCr of 0.99 mg/dL). Liver Function Tests:  Recent Labs Lab 05/11/16 1615  AST 32  ALT 23  ALKPHOS 61  BILITOT 0.5  PROT 7.8  ALBUMIN 4.6   No results for input(s): LIPASE, AMYLASE in the last 168 hours. No results for input(s): AMMONIA in the last 168 hours. Coagulation Profile: No results for input(s): INR, PROTIME in the last 168 hours. Cardiac Enzymes: No results for input(s): CKTOTAL, CKMB, CKMBINDEX, TROPONINI in the last 168 hours. BNP (last 3 results) No results for input(s): PROBNP in the last 8760 hours. HbA1C: No results for input(s): HGBA1C in the last 72 hours. CBG: No results for input(s): GLUCAP in the last 168 hours. Lipid Profile: No results for input(s): CHOL, HDL, LDLCALC, TRIG, CHOLHDL, LDLDIRECT in the last 72 hours. Thyroid Function Tests: No results for input(s): TSH, T4TOTAL, FREET4, T3FREE, THYROIDAB in the last 72 hours. Anemia Panel: No results for input(s): VITAMINB12, FOLATE, FERRITIN, TIBC, IRON, RETICCTPCT in the last 72 hours. Urine analysis:    Component Value Date/Time   COLORURINE YELLOW 04/18/2016 0950   APPEARANCEUR HAZY (A) 04/18/2016 0950   LABSPEC 1.016 04/18/2016 0950   PHURINE 6.0 04/18/2016 0950   GLUCOSEU NEGATIVE 04/18/2016 0950   HGBUR SMALL (A) 04/18/2016 0950   BILIRUBINUR NEGATIVE 04/18/2016 0950   KETONESUR 40 (A) 04/18/2016 0950   PROTEINUR NEGATIVE 04/18/2016 0950   UROBILINOGEN 0.2 09/14/2012 1751   NITRITE NEGATIVE 04/18/2016 0950   LEUKOCYTESUR MODERATE (A) 04/18/2016 0950   Sepsis Labs: @LABRCNTIP (procalcitonin:4,lacticidven:4)  ) Recent Results (from the past 240 hour(s))   C difficile quick scan w PCR reflex     Status: Abnormal   Collection Time: 05/11/16  3:00 PM  Result Value Ref Range Status   C Diff antigen POSITIVE (A) NEGATIVE Final   C Diff toxin POSITIVE (A) NEGATIVE Final   C Diff interpretation Toxin producing C. difficile detected.  Final    Comment: CRITICAL RESULT CALLED TO, READ BACK BY AND VERIFIED WITHAllegra Grana RN 2219 05/11/16 A BROWNING Performed at [facility]          Radiology Studies: No results found.      Scheduled Meds: . acidophilus  1 capsule Oral Daily  . amLODipine  5 mg Oral Daily  . enoxaparin (LOVENOX) injection  40 mg Subcutaneous QHS  . lisinopril  40 mg Oral Daily  . loratadine  10 mg Oral Daily  . LORazepam  0.5 mg Oral QPM  .  LORazepam  1 mg Oral BID AC & HS  . mirtazapine  45 mg Oral QHS  . vancomycin  125 mg Oral QID   Followed by  . [START ON 05/25/2016] vancomycin  125 mg Oral BID   Followed by  . [START ON 06/02/2016] vancomycin  125 mg Oral Daily   Followed by  . [START ON 06/09/2016] vancomycin  125 mg Oral QODAY   Followed by  . [START ON 06/17/2016] vancomycin  125 mg Oral Q3 days   Continuous Infusions: . sodium chloride 75 mL/hr at 05/11/16 2338     LOS: 1 day    Time spent: Greater than 35 Minutes.    Dana Allan, MD  [facility] Pager #: [phone] 7PM-7AM contact night coverage as above

## 2016-05-13 DIAGNOSIS — A047 Enterocolitis due to Clostridium difficile: Principal | ICD-10-CM

## 2016-05-13 LAB — RENAL FUNCTION PANEL
Albumin: 3.6 g/dL (ref 3.5–5.0)
Anion gap: 3 — ABNORMAL LOW (ref 5–15)
BUN: 10 mg/dL (ref 6–20)
CO2: 25 mmol/L (ref 22–32)
Calcium: 8.3 mg/dL — ABNORMAL LOW (ref 8.9–10.3)
Chloride: 112 mmol/L — ABNORMAL HIGH (ref 101–111)
Creatinine, Ser: 0.84 mg/dL (ref 0.44–1.00)
GFR calc Af Amer: >60 mL/min (ref >60)
GFR calc non Af Amer: >60 mL/min (ref >60)
Glucose, Bld: 97 mg/dL (ref 65–99)
Phosphorus: 2.6 mg/dL (ref 2.5–4.6)
Potassium: 3.5 mmol/L (ref 3.5–5.1)
Sodium: 140 mmol/L (ref 135–145)

## 2016-05-13 LAB — CBC WITH DIFFERENTIAL/PLATELET
Basophils Absolute: 0 10*3/uL (ref 0.0–0.1)
Basophils Relative: 0 %
Eosinophils Absolute: 0.4 10*3/uL (ref 0.0–0.7)
Eosinophils Relative: 3 %
HCT: 33.4 % — ABNORMAL LOW (ref 36.0–46.0)
Hemoglobin: 11 g/dL — ABNORMAL LOW (ref 12.0–15.0)
Lymphocytes Relative: 27 %
Lymphs Abs: 3.1 10*3/uL (ref 0.7–4.0)
MCH: 30.3 pg (ref 26.0–34.0)
MCHC: 32.9 g/dL (ref 30.0–36.0)
MCV: 92 fL (ref 78.0–100.0)
Monocytes Absolute: 0.8 10*3/uL (ref 0.1–1.0)
Monocytes Relative: 7 %
Neutro Abs: 6.9 10*3/uL (ref 1.7–7.7)
Neutrophils Relative %: 63 %
Platelets: 254 10*3/uL (ref 150–400)
RBC: 3.63 MIL/uL — ABNORMAL LOW (ref 3.87–5.11)
RDW: 13.3 % (ref 11.5–15.5)
WBC: 11.2 10*3/uL — ABNORMAL HIGH (ref 4.0–10.5)

## 2016-05-13 NOTE — Progress Notes (Signed)
PT Cancellation Note  Patient Details Name: Christina Escobar MRN: [id number] DOB: June 09, 1930   Cancelled Treatment:    Reason Eval/Treat Not Completed: Other (comment) (has been up frequently, wants to be assessed tomorrow, may not require PT.)   [name] 05/13/2016, 2:43 PM [name] PT [phone]

## 2016-05-13 NOTE — Progress Notes (Addendum)
PROGRESS NOTE    Christina Escobar  [id number] DOB: 04/30/1930 DOA: 05/11/2016 PCP: Haywood Pao, MD  Outpatient Specialists:    Brief Narrative: 82 y.o. female with a past medical history significant for HTN, migraines, and recent Cdiff and recurrence who presents with recurrent diarrhea.  The patient was in her usual state of health until July after a course of cefdinir for an ear infection, she developed frequent loose bowel movements.  Was admitted here, C diff toxin positive, treated with oral vancomycin, her symptoms resolved, and she was discharged to complete a 14 day course.  Completed this, symptoms resolved, then about a week after finishing vancomycin, she had sudden onset of frequent BMs again, was seen in the ER, again given oral vancomycin for 14 days.  Went home to the Rehab section of [facility] that time for 10 days, feels that this time her oral vancomycin was refridgerated and shaken before use (she hadn't known to do that). Again symptoms resolved.    Again she was fine after completing vancomycin for about a week, until today. Went for walk with dog and husband this morning, then when she came home, had 12 bowel movements, softer and softer, but never watery.  Thinks probably 12 BMs.  Had perhaps chills, low grade fever, malaise, then came to the ER.  ED course: -Temp 99.70F, heart rate 100s, respirations and BP and O2 saturation normal -Na 137, K 3.8, Cr 0.93 (baseline), WBC 17K, Hgb 12.1 -C diff toxin again positive -Case was discussed with Dr. Silverio Decamp who recommended admission and prolonged oral vancomycin taper and TRH were asked to admit for IV fluids.  Assessment & Plan:   Principal Problem:   C. difficile colitis Active Problems:   Migraine   Essential hypertension   Anxiety state  1. C diff colitis:  discussed case with GI. Plan is to continue vancomycin taper. Obtain dietitian consult at patient's request.   2. HTN:  -Continue home  quinapril and amlodipine, stable  3. Anxiety:  -Continue home mirtazapine, lorazepam -Continue home Ambien, dose down to 5 mg nightly given age  39. Migraine:  On Imitrex PRN at home  5. Other medications: -Continue home loratadine  6. Volume Depletion - improved after IV fluid rehydration. We'll continue IV fluids.  DVT prophylaxis: Lovenox  Code Status: FULL  Family Communication:  Disposition Plan: Home eventually. Consults called:  none, case discussed with GI who recommended oral vancomycin taper  Procedures:   None  Antimicrobials:   Oral Vancomycin  (Documented allergy to Metronidazole)    Subjective: Patient reports feeling mildly better. Still having intermittent bouts of diarrhea  Objective: Vitals:   05/12/16 0610 05/12/16 1437 05/12/16 2055 05/13/16 0448  BP: (!) 146/64 (!) 106/41 122/60 (!) 129/59  Pulse: 92 78 81 76  Resp: 20 20 18 20   Temp: 98 F (36.7 C) 98.8 F (37.1 C) 99.6 F (37.6 C) 98.7 F (37.1 C)  TempSrc: Oral Oral Oral Oral  SpO2: 98% 98% 96% 99%  Weight:      Height:        Intake/Output Summary (Last 24 hours) at 05/13/16 1620 Last data filed at 05/13/16 1447  Gross per 24 hour  Intake             1368 ml  Output                0 ml  Net             1368 ml  Filed Weights   05/11/16 1459  Weight: 48.1 kg (106 lb)    Examination:  General exam: Appears calm and comfortable, In no acute distress Respiratory system: Clear to auscultation. Equal chest rise Cardiovascular system: S1 & S2.No cyanosis Gastrointestinal system: Abdomen is minimally distended, soft and nontender. Organs were not palpated. Normal bowel sounds heard. Central nervous system: Alert and oriented. Moves all limbs. Extremities: No leg edema.   Data Reviewed: I have personally reviewed following labs and imaging studies  CBC:  Recent Labs Lab 05/11/16 1615 05/12/16 0531 05/13/16 0202  WBC 17.0* 12.3* 11.2*  NEUTROABS 14.4*  --  6.9  HGB  12.1 11.4* 11.0*  HCT 36.1 34.3* 33.4*  MCV 92.8 91.7 92.0  PLT 303 278 0000000   Basic Metabolic Panel:  Recent Labs Lab 05/11/16 1615 05/12/16 0531 05/13/16 0202  NA 137 139 140  K 3.8 3.6 3.5  CL 100* 106 112*  CO2 27 26 25   GLUCOSE 131* 110* 97  BUN 20 13 10   CREATININE 0.93 0.99 0.84  CALCIUM 10.1 9.2 8.3*  PHOS  --   --  2.6   GFR: Estimated Creatinine Clearance: 38.5 mL/min (by C-G formula based on SCr of 0.84 mg/dL). Liver Function Tests:  Recent Labs Lab 05/11/16 1615 05/13/16 0202  AST 32  --   ALT 23  --   ALKPHOS 61  --   BILITOT 0.5  --   PROT 7.8  --   ALBUMIN 4.6 3.6   No results for input(s): LIPASE, AMYLASE in the last 168 hours. No results for input(s): AMMONIA in the last 168 hours. Coagulation Profile: No results for input(s): INR, PROTIME in the last 168 hours. Cardiac Enzymes: No results for input(s): CKTOTAL, CKMB, CKMBINDEX, TROPONINI in the last 168 hours. BNP (last 3 results) No results for input(s): PROBNP in the last 8760 hours. HbA1C: No results for input(s): HGBA1C in the last 72 hours. CBG: No results for input(s): GLUCAP in the last 168 hours. Lipid Profile: No results for input(s): CHOL, HDL, LDLCALC, TRIG, CHOLHDL, LDLDIRECT in the last 72 hours. Thyroid Function Tests: No results for input(s): TSH, T4TOTAL, FREET4, T3FREE, THYROIDAB in the last 72 hours. Anemia Panel: No results for input(s): VITAMINB12, FOLATE, FERRITIN, TIBC, IRON, RETICCTPCT in the last 72 hours. Urine analysis:    Component Value Date/Time   COLORURINE YELLOW 04/18/2016 0950   APPEARANCEUR HAZY (A) 04/18/2016 0950   LABSPEC 1.016 04/18/2016 0950   PHURINE 6.0 04/18/2016 0950   GLUCOSEU NEGATIVE 04/18/2016 0950   HGBUR SMALL (A) 04/18/2016 0950   BILIRUBINUR NEGATIVE 04/18/2016 0950   KETONESUR 40 (A) 04/18/2016 0950   PROTEINUR NEGATIVE 04/18/2016 0950   UROBILINOGEN 0.2 09/14/2012 1751   NITRITE NEGATIVE 04/18/2016 0950   LEUKOCYTESUR MODERATE  (A) 04/18/2016 0950   Sepsis Labs: @LABRCNTIP (procalcitonin:4,lacticidven:4)  ) Recent Results (from the past 240 hour(s))  C difficile quick scan w PCR reflex     Status: Abnormal   Collection Time: 05/11/16  3:00 PM  Result Value Ref Range Status   C Diff antigen POSITIVE (A) NEGATIVE Final   C Diff toxin POSITIVE (A) NEGATIVE Final   C Diff interpretation Toxin producing C. difficile detected.  Final    Comment: CRITICAL RESULT CALLED TO, READ BACK BY AND VERIFIED WITHAllegra Grana RN 2219 05/11/16 A BROWNING Performed at [facility]          Radiology Studies: No results found.      Scheduled Meds: . acidophilus  1 capsule Oral Daily  . amLODipine  5 mg Oral Daily  . enoxaparin (LOVENOX) injection  40 mg Subcutaneous QHS  . lisinopril  40 mg Oral Daily  . loratadine  10 mg Oral Daily  . LORazepam  0.5 mg Oral QPM  . LORazepam  1 mg Oral BID AC & HS  . mirtazapine  45 mg Oral QHS  . vancomycin  125 mg Oral QID   Followed by  . [START ON 05/25/2016] vancomycin  125 mg Oral BID   Followed by  . [START ON 06/02/2016] vancomycin  125 mg Oral Daily   Followed by  . [START ON 06/09/2016] vancomycin  125 mg Oral QODAY   Followed by  . [START ON 06/17/2016] vancomycin  125 mg Oral Q3 days   Continuous Infusions:     LOS: 2 days    Time spent: Greater than 35 Minutes.    AddendumVelvet Bathe , MD  [facility] Pager #[phone] 7PM-7AM contact night coverage as above

## 2016-05-14 MED ORDER — VANCOMYCIN 50 MG/ML ORAL SOLUTION: 125.0000 mg | Freq: Two times a day (BID) | ORAL | 0 refills | Status: AC

## 2016-05-14 MED ORDER — VANCOMYCIN 50 MG/ML ORAL SOLUTION: 125.0000 mg | ORAL | 0 refills | Status: AC

## 2016-05-14 MED ORDER — VANCOMYCIN 50 MG/ML ORAL SOLUTION: 125.0000 mg | Freq: Four times a day (QID) | ORAL | 0 refills | Status: AC

## 2016-05-14 MED ORDER — ZOLPIDEM TARTRATE 5 MG PO TABS: 5.0000 mg | ORAL_TABLET | Freq: Every evening | ORAL | Status: DC | PRN

## 2016-05-14 MED ORDER — VANCOMYCIN 50 MG/ML ORAL SOLUTION
125.0000 mg | Freq: Every day | ORAL | 0 refills | Status: AC
Start: 2016-06-02 — End: 2016-06-09

## 2016-05-14 NOTE — Discharge Summary (Signed)
Physician Discharge Summary  Christina Escobar [id number] DOB: 1929-10-01 DOA: 05/11/2016  PCP: Haywood Pao, MD  Admit date: 05/11/2016 Discharge date: 05/14/2016  Time spent: > 35  minutes  Recommendations for Outpatient Follow-up:  1. WBC within normal limits 2. Please ensure patient follows up with infectious disease specialist after discharge   Discharge Diagnoses:  Principal Problem:   C. difficile colitis Active Problems:   Migraine   Essential hypertension   Anxiety state   Discharge Condition: stable  Diet recommendation: Heart healthy diet  Filed Weights   05/11/16 1459  Weight: 48.1 kg (106 lb)    History of present illness:  82 y/o that presented with diarrhea. Patient Had history of prior C. difficile infections and was treated for recurrent C. difficile again.  Hospital Course:  C. difficile associated diarrhea - Patient will be discharged on an oral vancomycin regimen - I have recommended that she follow-up with infectious disease specialist after hospital discharge - case was discussed with gastroenterologist while patient was in house who recommended oral vancomycin taper  Otherwise for known medical conditions listed above will continue home medication regimen listed below  Procedures:  None  Consultations:  None  Discharge Exam: Vitals:   05/13/16 2053 05/14/16 0500  BP: (!) 122/52 130/67  Pulse: 81 72  Resp: 16 18  Temp: 98.9 F (37.2 C) 98.6 F (37 C)    General: Pt in nad, alert and awake Cardiovascular: rrr, no rubs Respiratory: no increased wob, no wheezes  Discharge Instructions   Discharge Instructions    Call MD for:  extreme fatigue    Complete by:  As directed   Call MD for:  temperature >100.4    Complete by:  As directed   Diet - low sodium heart healthy    Complete by:  As directed   Increase activity slowly    Complete by:  As directed     Current Discharge Medication List    START taking these  medications   Details  !! vancomycin (VANCOCIN) 50 mg/mL oral solution Take 2.5 mLs (125 mg total) by mouth 4 (four) times daily. Qty: 20 mL, Refills: 0    !! vancomycin (VANCOCIN) 50 mg/mL oral solution Take 2.5 mLs (125 mg total) by mouth 2 (two) times daily. Qty: 20 mL, Refills: 0    !! vancomycin (VANCOCIN) 50 mg/mL oral solution Take 2.5 mLs (125 mg total) by mouth daily. Qty: 20 mL, Refills: 0    !! vancomycin (VANCOCIN) 50 mg/mL oral solution Take 2.5 mLs (125 mg total) by mouth every other day. Qty: 20 mL, Refills: 0    !! vancomycin (VANCOCIN) 50 mg/mL oral solution Take 2.5 mLs (125 mg total) by mouth every 3 (three) days. Qty: 20 mL, Refills: 0     !! - Potential duplicate medications found. Please discuss with provider.    CONTINUE these medications which have CHANGED   Details  zolpidem (AMBIEN) 5 MG tablet Take 1 tablet (5 mg total) by mouth at bedtime as needed for sleep.      CONTINUE these medications which have NOT CHANGED   Details  acetaminophen (TYLENOL) 500 MG tablet Take 1,000 mg by mouth 2 (two) times daily.     acidophilus (RISAQUAD) CAPS capsule Take 1 capsule by mouth daily.    amLODipine (NORVASC) 5 MG tablet Take 5 mg by mouth daily.    Calcium Carbonate-Vitamin D3 (CALCIUM 600-D) 600-400 MG-UNIT TABS Take 1 tablet by mouth 2 (two) times daily with  breakfast and lunch.    denosumab (PROLIA) 60 MG/ML SOLN injection Inject 60 mg into the skin every 6 (six) months.    loratadine (CLARITIN) 10 MG tablet Take 10 mg by mouth daily.    !! LORazepam (ATIVAN) 0.5 MG tablet Take 0.5 mg by mouth every evening. Pt takes this dose at 5pm.    !! LORazepam (ATIVAN) 1 MG tablet Take 1 mg by mouth 2 (two) times daily at 8 am and 10 pm.    mirtazapine (REMERON) 45 MG tablet Take 45 mg by mouth at bedtime.     Multiple Vitamin (MULTIVITAMIN WITH MINERALS) TABS tablet Take 1 tablet by mouth daily.    quinapril (ACCUPRIL) 40 MG tablet Take 40 mg by mouth Daily.      SUMAtriptan (IMITREX) 25 MG tablet Take 25 mg by mouth every 2 (two) hours as needed for migraine.      !! - Potential duplicate medications found. Please discuss with provider.    STOP taking these medications     psyllium (HYDROCIL/METAMUCIL) 95 % PACK        Allergies  Allergen Reactions  . Erythromycin Nausea And Vomiting  . Flagyl [Metronidazole] Diarrhea  . Meloxicam Other (See Comments)    Reaction:  Dizziness       The results of significant diagnostics from this hospitalization (including imaging, microbiology, ancillary and laboratory) are listed below for reference.    Significant Diagnostic Studies: No results found.  Microbiology: Recent Results (from the past 240 hour(s))  C difficile quick scan w PCR reflex     Status: Abnormal   Collection Time: 05/11/16  3:00 PM  Result Value Ref Range Status   C Diff antigen POSITIVE (A) NEGATIVE Final   C Diff toxin POSITIVE (A) NEGATIVE Final   C Diff interpretation Toxin producing C. difficile detected.  Final    Comment: CRITICAL RESULT CALLED TO, READ BACK BY AND VERIFIED WITHAllegra Grana RN 2219 05/11/16 A BROWNING Performed at [place]: Basic Metabolic Panel:  Recent Labs Lab 05/11/16 1615 05/12/16 0531 05/13/16 0202  NA 137 139 140  K 3.8 3.6 3.5  CL 100* 106 112*  CO2 27 26 25   GLUCOSE 131* 110* 97  BUN 20 13 10   CREATININE 0.93 0.99 0.84  CALCIUM 10.1 9.2 8.3*  PHOS  --   --  2.6   Liver Function Tests:  Recent Labs Lab 05/11/16 1615 05/13/16 0202  AST 32  --   ALT 23  --   ALKPHOS 61  --   BILITOT 0.5  --   PROT 7.8  --   ALBUMIN 4.6 3.6   No results for input(s): LIPASE, AMYLASE in the last 168 hours. No results for input(s): AMMONIA in the last 168 hours. CBC:  Recent Labs Lab 05/11/16 1615 05/12/16 0531 05/13/16 0202  WBC 17.0* 12.3* 11.2*  NEUTROABS 14.4*  --  6.9  HGB 12.1 11.4* 11.0*  HCT 36.1 34.3* 33.4*  MCV 92.8 91.7 92.0  PLT 303 278 254    Cardiac Enzymes: No results for input(s): CKTOTAL, CKMB, CKMBINDEX, TROPONINI in the last 168 hours. BNP: BNP (last 3 results) No results for input(s): BNP in the last 8760 hours.  ProBNP (last 3 results) No results for input(s): PROBNP in the last 8760 hours.  CBG: No results for input(s): GLUCAP in the last 168 hours.   Signed:  Velvet Bathe MD.  [facility] 05/14/2016, 1:06 PM

## 2016-05-14 NOTE — Progress Notes (Signed)
Date: May 14, 2016 Discharge orders checked for needs. No needs present at time of discharge. Velva Harman, RN, BSN, [place]   [phone]

## 2016-05-14 NOTE — Progress Notes (Signed)
PT Cancellation Note  Patient Details Name: Christina Escobar MRN: [id number] DOB: Oct 06, 1929   Cancelled Treatment:    Reason Eval/Treat Not Completed: PT screened, no needs identified, will sign off   [name] 05/14/2016, 8:36 AM [name] PT [phone]

## 2016-05-14 NOTE — Progress Notes (Signed)
Nutrition Brief Note  Consult placed yesterday for assessment of nutrition requirements and status.   Wt Readings from Last 15 Encounters:  05/11/16 106 lb (48.1 kg)  03/28/16 106 lb (48.1 kg)  07/12/15 111 lb (50.3 kg)  03/19/15 109 lb (49.4 kg)  12/26/14 112 lb 6 oz (51 kg)  12/05/14 114 lb (51.7 kg)  09/21/14 112 lb (50.8 kg)  11/20/13 111 lb (50.3 kg)  08/26/13 109 lb (49.4 kg)  05/22/13 108 lb (49 kg)  02/28/13 106 lb (48.1 kg)  12/22/12 110 lb (49.9 kg)  09/14/12 110 lb (49.9 kg)  11/05/11 109 lb (49.4 kg)    Body mass index is 19.39 kg/m. Patient meets criteria for normal weight based on current BMI. No recent weight hx available for comparison to CBW. No skin issues noted at this time.  D/c order and d/c summary in place earlier today. Labs and medications reviewed.  Current diet order is Heart Healthy (previously Regular). Per chart review, pt consumed 50% of breakfast and lunch 8/29, 90% of breakfast and 100% of lunch 8/20, and 100% of breakfast this AM.   No nutrition interventions warranted at this time. If nutrition issues arise, please consult RD.     Jarome Matin, MS, RD, LDN Inpatient Clinical Dietitian Pager # [phone] After hours/weekend pager # [phone]

## 2016-07-01 ENCOUNTER — Encounter (HOSPITAL_COMMUNITY): Payer: Self-pay

## 2016-07-01 ENCOUNTER — Ambulatory Visit (HOSPITAL_COMMUNITY)
Admission: RE | Admit: 2016-07-01 | Discharge: 2016-07-01 | Disposition: A | Discharge status: Home / Self Care | Payer: Medicare Other | Source: Ambulatory Visit | Attending: Internal Medicine | Admitting: Internal Medicine

## 2016-07-01 DIAGNOSIS — M810 Age-related osteoporosis without current pathological fracture: Secondary | ICD-10-CM | POA: Insufficient documentation

## 2016-07-01 MED ORDER — DENOSUMAB 60 MG/ML ~~LOC~~ SOLN
60.0000 mg | Freq: Once | SUBCUTANEOUS | Status: AC
  Administered 2016-07-01: 60 mg via SUBCUTANEOUS
  Filled 2016-07-01: qty 1

## 2016-07-01 NOTE — Discharge Instructions (Signed)
Denosumab injection  What is this medicine?  DENOSUMAB (den oh sue mab) slows bone breakdown. Prolia is used to treat osteoporosis in women after menopause and in men. Xgeva is used to prevent bone fractures and other bone problems caused by cancer bone metastases. Xgeva is also used to treat giant cell tumor of the bone.  This medicine may be used for other purposes; ask your health care provider or pharmacist if you have questions.  What should I tell my health care provider before I take this medicine?  They need to know if you have any of these conditions:  -dental disease  -eczema  -infection or history of infections  -kidney disease or on dialysis  -low blood calcium or vitamin D  -malabsorption syndrome  -scheduled to have surgery or tooth extraction  -taking medicine that contains denosumab  -thyroid or parathyroid disease  -an unusual reaction to denosumab, other medicines, foods, dyes, or preservatives  -pregnant or trying to get pregnant  -breast-feeding  How should I use this medicine?  This medicine is for injection under the skin. It is given by a health care professional in a hospital or clinic setting.  If you are getting Prolia, a special MedGuide will be given to you by the pharmacist with each prescription and refill. Be sure to read this information carefully each time.  For Prolia, talk to your pediatrician regarding the use of this medicine in children. Special care may be needed. For Xgeva, talk to your pediatrician regarding the use of this medicine in children. While this drug may be prescribed for children as young as 13 years for selected conditions, precautions do apply.  Overdosage: If you think you have taken too much of this medicine contact a poison control center or emergency room at once.  NOTE: This medicine is only for you. Do not share this medicine with others.  What if I miss a dose?  It is important not to miss your dose. Call your doctor or health care professional if you are  unable to keep an appointment.  What may interact with this medicine?  Do not take this medicine with any of the following medications:  -other medicines containing denosumab  This medicine may also interact with the following medications:  -medicines that suppress the immune system  -medicines that treat cancer  -steroid medicines like prednisone or cortisone  This list may not describe all possible interactions. Give your health care provider a list of all the medicines, herbs, non-prescription drugs, or dietary supplements you use. Also tell them if you smoke, drink alcohol, or use illegal drugs. Some items may interact with your medicine.  What should I watch for while using this medicine?  Visit your doctor or health care professional for regular checks on your progress. Your doctor or health care professional may order blood tests and other tests to see how you are doing.  Call your doctor or health care professional if you get a cold or other infection while receiving this medicine. Do not treat yourself. This medicine may decrease your body's ability to fight infection.  You should make sure you get enough calcium and vitamin D while you are taking this medicine, unless your doctor tells you not to. Discuss the foods you eat and the vitamins you take with your health care professional.  See your dentist regularly. Brush and floss your teeth as directed. Before you have any dental work done, tell your dentist you are receiving this medicine.  Do   not become pregnant while taking this medicine or for 5 months after stopping it. Women should inform their doctor if they wish to become pregnant or think they might be pregnant. There is a potential for serious side effects to an unborn child. Talk to your health care professional or pharmacist for more information.  What side effects may I notice from receiving this medicine?  Side effects that you should report to your doctor or health care professional as soon as  possible:  -allergic reactions like skin rash, itching or hives, swelling of the face, lips, or tongue  -breathing problems  -chest pain  -fast, irregular heartbeat  -feeling faint or lightheaded, falls  -fever, chills, or any other sign of infection  -muscle spasms, tightening, or twitches  -numbness or tingling  -skin blisters or bumps, or is dry, peels, or red  -slow healing or unexplained pain in the mouth or jaw  -unusual bleeding or bruising  Side effects that usually do not require medical attention (Report these to your doctor or health care professional if they continue or are bothersome.):  -muscle pain  -stomach upset, gas  This list may not describe all possible side effects. Call your doctor for medical advice about side effects. You may report side effects to FDA at 1-800-FDA-1088.  Where should I keep my medicine?  This medicine is only given in a clinic, doctor's office, or other health care setting and will not be stored at home.  NOTE: This sheet is a summary. It may not cover all possible information. If you have questions about this medicine, talk to your doctor, pharmacist, or health care provider.      2016, Elsevier/Gold Standard. (2012-02-29 12:37:47)

## 2016-07-15 ENCOUNTER — Ambulatory Visit (INDEPENDENT_AMBULATORY_CARE_PROVIDER_SITE_OTHER): Payer: Medicare Other | Admitting: Internal Medicine

## 2016-07-15 VITALS — BP 136/72 | HR 82 | Temp 97.5°F | Wt 102.8 lb

## 2016-07-15 DIAGNOSIS — A0471 Enterocolitis due to Clostridium difficile, recurrent: Secondary | ICD-10-CM

## 2016-07-15 NOTE — Progress Notes (Signed)
RFV: recurrent cdifficile infection  Patient ID: Christina Escobar, female   DOB: 09-12-1930, 82 y.o.   MRN: [id number]  HPI  Hx of IBS, followed by dr. Paulita Fujita. She was referred by her PCP, dr Osborne Casco for recurrent cdifficile. She had been usual state of health up until July where she had ear infection and treated with a course of cefdinir and subsequently developed diarrhea and admitted for evaluation of diarrhea and dehydration on 7/12. She was positive for cdifficile and give a 14 day course of oral vanco. After a week of doing well after finishing her abtx, she started to have more frequent BMs and evaluated in the ED where she was given her 2nd course of oral vanco which she took up until mid August. Her symptoms again returend on 8/28, where at that time she had up to 12 BM, loose stools with chills, malaise. She was admitted in late August for cdifficile colitisl.  Has had 3 hospitalization for cdifficile. Last discharged on 8/31, where she was discharged on prolonged vanco taper over 6 wk  Testing revealed 8/28 positive, 8/5 negative toxin, positive antigen, 7/13 positive  Outpatient Encounter Prescriptions as of 07/15/2016  Medication Sig  . acetaminophen (TYLENOL) 500 MG tablet Take 1,000 mg by mouth 2 (two) times daily.   Marland Kitchen acidophilus (RISAQUAD) CAPS capsule Take 1 capsule by mouth daily.  Marland Kitchen amLODipine (NORVASC) 5 MG tablet Take 5 mg by mouth daily.  . Calcium Carbonate-Vitamin D3 (CALCIUM 600-D) 600-400 MG-UNIT TABS Take 1 tablet by mouth 2 (two) times daily with breakfast and lunch.  . denosumab (PROLIA) 60 MG/ML SOLN injection Inject 60 mg into the skin every 6 (six) months.  . loratadine (CLARITIN) 10 MG tablet Take 10 mg by mouth daily.  Marland Kitchen LORazepam (ATIVAN) 0.5 MG tablet Take 0.5 mg by mouth every evening. Pt takes this dose at 5pm.  . LORazepam (ATIVAN) 1 MG tablet Take 1 mg by mouth 2 (two) times daily at 8 am and 10 pm.  . mirtazapine (REMERON) 45 MG tablet Take 45 mg  by mouth at bedtime.   . Multiple Vitamin (MULTIVITAMIN WITH MINERALS) TABS tablet Take 1 tablet by mouth daily.  . quinapril (ACCUPRIL) 40 MG tablet Take 40 mg by mouth Daily.   . SUMAtriptan (IMITREX) 25 MG tablet Take 25 mg by mouth every 2 (two) hours as needed for migraine.   Marland Kitchen zolpidem (AMBIEN) 5 MG tablet Take 1 tablet (5 mg total) by mouth at bedtime as needed for sleep.   No facility-administered encounter medications on file as of 07/15/2016.      Patient Active Problem List   Diagnosis Date Noted  . C. difficile colitis 05/11/2016  . Essential hypertension 05/11/2016  . Anxiety state 05/11/2016  . Clostridium difficile diarrhea   . Clostridium difficile infection 03/27/2016  . Antibiotic enterocolitis 03/25/2016  . Migraine 02/28/2013  . Hemorrhoids, external, thrombosed 11/05/2011     Health Maintenance Due  Topic Date Due  . TETANUS/TDAP  06/07/1951  . ZOSTAVAX  06/06/1992  . DEXA SCAN  06/06/1997  . PNA vac Low Risk Adult (1 of 2 - PCV13) 06/06/1997  . INFLUENZA VACCINE  04/14/2016     Review of Systems  Constitutional: Negative for fever, chills, diaphoresis, activity change, appetite change, fatigue and unexpected weight change.  HENT: Negative for congestion, sore throat, rhinorrhea, sneezing, trouble swallowing and sinus pressure.  Eyes: Negative for photophobia and visual disturbance.  Respiratory: Negative for cough, chest tightness, shortness of breath,  wheezing and stridor.  Cardiovascular: Negative for chest pain, palpitations and leg swelling.  Gastrointestinal: Negative for nausea, vomiting, abdominal pain, diarrhea, constipation, blood in stool, abdominal distention and anal bleeding.  Genitourinary: Negative for dysuria, hematuria, flank pain and difficulty urinating.  Musculoskeletal: Negative for myalgias, back pain, joint swelling, arthralgias and gait problem.  Skin: Negative for color change, pallor, rash and wound.  Neurological: Negative for  dizziness, tremors, weakness and light-headedness.  Hematological: Negative for adenopathy. Does not bruise/bleed easily.  Psychiatric/Behavioral: Negative for behavioral problems, confusion, sleep disturbance, dysphoric mood, decreased concentration and agitation.    Physical Exam   Wt 102 lb 12.8 oz (46.6 kg)   BMI 18.80 kg/m  Physical Exam  Constitutional:  oriented to person, place, and time. appears well-developed and well-nourished. No distress.  HENT: [place]/AT, PERRLA, no scleral icterus Mouth/Throat: Oropharynx is clear and moist. No oropharyngeal exudate.  Cardiovascular: Normal rate, regular rhythm and normal heart sounds. Exam reveals no gallop and no friction rub.  No murmur heard.  Pulmonary/Chest: Effort normal and breath sounds normal. No respiratory distress.  has no wheezes.  Neck = supple, no nuchal rigidity Abdominal: Soft. Bowel sounds are normal.  exhibits no distension. There is no tenderness.  Lymphadenopathy: no cervical adenopathy. No axillary adenopathy Neurological: alert and oriented to person, place, and time.  Skin: Skin is warm and dry. No rash noted. No erythema.  Psychiatric: a normal mood and affect.  behavior is normal.   CBC Lab Results  Component Value Date   WBC 11.2 (H) 05/13/2016   RBC 3.63 (L) 05/13/2016   HGB 11.0 (L) 05/13/2016   HCT 33.4 (L) 05/13/2016   PLT 254 05/13/2016   MCV 92.0 05/13/2016   MCH 30.3 05/13/2016   MCHC 32.9 05/13/2016   RDW 13.3 05/13/2016   LYMPHSABS 3.1 05/13/2016   MONOABS 0.8 05/13/2016   EOSABS 0.4 05/13/2016   BASOSABS 0.0 05/13/2016   BMET Lab Results  Component Value Date   NA 140 05/13/2016   K 3.5 05/13/2016   CL 112 (H) 05/13/2016   CO2 25 05/13/2016   GLUCOSE 97 05/13/2016   BUN 10 05/13/2016   CREATININE 0.84 05/13/2016   CALCIUM 8.3 (L) 05/13/2016   GFRNONAA >60 05/13/2016   GFRAA >60 05/13/2016     Assessment and Plan  Recurrent clostridium difficile = currently asymptomatic, though  she has recently finished her vancomycin taper and is in the period of having relapse as she has had in the past. They are interested in doing FMT through fecal pills through [facility].  We will arrange for purchase and having it done in clinic. We will also stick to the protocol to pre-treat for 14 days with oral vancomycin prior to doing the FMT.  Spent 60 min with patient with greater than50% in discussion FMT options

## 2016-07-20 ENCOUNTER — Telehealth: Payer: Self-pay | Admitting: *Deleted

## 2016-07-20 NOTE — Telephone Encounter (Signed)
Patient is "Wanting to know when oral vancomycin rx will be prescribed?"  She thought it was to start after her appointment with Dr. Baxter Flattery on 07/15/16.  Dr. Baxter Flattery please advise/prescirbe.

## 2016-07-21 ENCOUNTER — Other Ambulatory Visit: Payer: Self-pay | Admitting: Internal Medicine

## 2016-07-21 ENCOUNTER — Telehealth: Payer: Self-pay

## 2016-07-21 DIAGNOSIS — A0471 Enterocolitis due to Clostridium difficile, recurrent: Secondary | ICD-10-CM

## 2016-07-21 DIAGNOSIS — R195 Other fecal abnormalities: Secondary | ICD-10-CM

## 2016-07-21 MED ORDER — VANCOMYCIN 50 MG/ML ORAL SOLUTION: 125.0000 mg | Freq: Four times a day (QID) | ORAL | 0 refills | Status: DC

## 2016-07-21 NOTE — Progress Notes (Signed)
Patient is having worsening watery diarrhea for hte past 24-hr, concerning for relapse. We will have them drop off stool specimen for cdifficile testing, and empirically treat for cdiff

## 2016-07-21 NOTE — Telephone Encounter (Signed)
Patient has restarted the diarrhea. He has a specimen container but unsure when to collect it and would like to know what they should do in the meantime.  He does not want her to suffer with the diarrhea.  Dr Baxter Flattery will return call to patient's husband now.

## 2016-07-22 ENCOUNTER — Other Ambulatory Visit: Payer: Medicare Other

## 2016-07-22 ENCOUNTER — Other Ambulatory Visit: Payer: Self-pay | Admitting: Internal Medicine

## 2016-07-22 DIAGNOSIS — A0471 Enterocolitis due to Clostridium difficile, recurrent: Secondary | ICD-10-CM

## 2016-07-23 LAB — C. DIFFICILE GDH AND TOXIN A/B
C. DIFF TOXIN A/B: DETECTED — AB
C. difficile GDH: DETECTED — AB

## 2016-07-24 ENCOUNTER — Telehealth: Payer: Self-pay | Admitting: *Deleted

## 2016-07-24 NOTE — Telephone Encounter (Signed)
Patient's husband, [name] called for the results of wife's stool sample. Advised I will notify Dr. Baxter Flattery to review and someone from this office will return his call. Myrtis Hopping CMA

## 2016-07-24 NOTE — Telephone Encounter (Signed)
Dr Baxter Flattery contacted patient at home.

## 2016-07-28 ENCOUNTER — Telehealth: Payer: Self-pay

## 2016-07-28 NOTE — Telephone Encounter (Signed)
I gave a call to the patient to try to let her know that we are still planning the fecal transplant. Awaiting to hear back from [facility] for fmt pills

## 2016-07-28 NOTE — Telephone Encounter (Signed)
Patient and husband called and is requesting Dr. Baxter Flattery call because they are wondering what is the next step in the process of treatment. Explained to patient a message would be sent to Dr. Baxter Flattery with their request. Patient stated understanding and verified that number on file is the correct number. Rodman Key, LPN

## 2016-08-03 NOTE — Telephone Encounter (Signed)
Can you refill her oral vancomycin for me. 125mg  QID x 14 days. Let her know we are still working on solidifying the date for the [place]

## 2016-08-04 ENCOUNTER — Other Ambulatory Visit: Payer: Self-pay | Admitting: Internal Medicine

## 2016-08-04 ENCOUNTER — Ambulatory Visit: Payer: Medicare Other | Admitting: Internal Medicine

## 2016-08-04 DIAGNOSIS — Z1231 Encounter for screening mammogram for malignant neoplasm of breast: Secondary | ICD-10-CM

## 2016-08-04 DIAGNOSIS — R197 Diarrhea, unspecified: Secondary | ICD-10-CM

## 2016-08-04 MED ORDER — VANCOMYCIN 50 MG/ML ORAL SOLUTION: 125.0000 mg | Freq: Four times a day (QID) | ORAL | 0 refills | Status: DC

## 2016-08-11 ENCOUNTER — Other Ambulatory Visit: Payer: Self-pay | Admitting: *Deleted

## 2016-08-11 NOTE — Telephone Encounter (Signed)
Refill already completed.

## 2016-08-12 ENCOUNTER — Telehealth: Payer: Self-pay

## 2016-08-12 NOTE — Telephone Encounter (Signed)
Patient husband is calling office to speak with Dr Baxter Flattery. He states Dr Baxter Flattery was to call them today . He has not heard from our office.   Laverle Patter, RN

## 2016-08-13 ENCOUNTER — Encounter: Payer: Self-pay | Admitting: Internal Medicine

## 2016-08-13 ENCOUNTER — Ambulatory Visit (INDEPENDENT_AMBULATORY_CARE_PROVIDER_SITE_OTHER): Payer: Medicare Other | Admitting: Internal Medicine

## 2016-08-13 VITALS — BP 147/71 | HR 72 | Temp 97.8°F | Ht 62.0 in | Wt 102.5 lb

## 2016-08-13 DIAGNOSIS — A0471 Enterocolitis due to Clostridium difficile, recurrent: Secondary | ICD-10-CM

## 2016-08-13 NOTE — Patient Instructions (Signed)
We will see you back on Thursday Dec7th @ 10 am

## 2016-08-13 NOTE — Progress Notes (Signed)
Pt is going to check with PCP to confirm the date of her last Pneumovax and will follow up with [facility]

## 2016-08-18 ENCOUNTER — Telehealth: Payer: Self-pay | Admitting: Pharmacist

## 2016-08-18 NOTE — Telephone Encounter (Signed)
Called Christina Escobar and reminded her of the process before Thursday.  She already took her omeprazole 20 mg this morning.  She stopped her oral vanco last night.  She had 7 normal bowel movements yesterday and has had 3 normal bowel movements this morning.  I told her she could eat whatever she wanted tonight and tomorrow and that I would call her tomorrow and go over things again.  She verbalized understanding.

## 2016-08-20 ENCOUNTER — Ambulatory Visit (INDEPENDENT_AMBULATORY_CARE_PROVIDER_SITE_OTHER): Payer: Medicare Other | Admitting: Internal Medicine

## 2016-08-20 ENCOUNTER — Encounter: Payer: Self-pay | Admitting: Internal Medicine

## 2016-08-20 VITALS — BP 146/77 | HR 72 | Temp 97.8°F | Wt 102.0 lb

## 2016-08-20 DIAGNOSIS — A0471 Enterocolitis due to Clostridium difficile, recurrent: Secondary | ICD-10-CM

## 2016-08-20 NOTE — Progress Notes (Signed)
Patient ID: Christina Escobar, female   DOB: December 25, 1929, 82 y.o.   MRN: [id number]  HPI [name] is a 82yo F with recurrent cdi who has been on prolonged oral vanco taper and recently prepared for FMT oral capsule treatment. She has stopped her oral vanco 2 days ago. Took daily dose of omeprazole x 2. She has nothing by mouth for 2 hours prior to this visit. She did not have any watery stools yesterday.  Outpatient Encounter Prescriptions as of 08/20/2016  Medication Sig  . acetaminophen (TYLENOL) 500 MG tablet Take 1,000 mg by mouth 2 (two) times daily.   Marland Kitchen acidophilus (RISAQUAD) CAPS capsule Take 1 capsule by mouth daily.  Marland Kitchen amLODipine (NORVASC) 5 MG tablet Take 5 mg by mouth daily.  . Calcium Carbonate-Vitamin D3 (CALCIUM 600-D) 600-400 MG-UNIT TABS Take 1 tablet by mouth 2 (two) times daily with breakfast and lunch.  . denosumab (PROLIA) 60 MG/ML SOLN injection Inject 60 mg into the skin every 6 (six) months.  . Lactase (LACTAID PO) Take by mouth as needed.  . loratadine (CLARITIN) 10 MG tablet Take 10 mg by mouth daily.  Marland Kitchen LORazepam (ATIVAN) 0.5 MG tablet Take 0.5 mg by mouth every evening. Pt takes this dose at 5pm.  . LORazepam (ATIVAN) 1 MG tablet Take 1 mg by mouth 2 (two) times daily at 8 am and 10 pm.  . mirtazapine (REMERON) 45 MG tablet Take 45 mg by mouth at bedtime.   . Multiple Vitamin (MULTIVITAMIN WITH MINERALS) TABS tablet Take 1 tablet by mouth daily.  . quinapril (ACCUPRIL) 40 MG tablet Take 40 mg by mouth Daily.   . SUMAtriptan (IMITREX) 25 MG tablet Take 25 mg by mouth every 2 (two) hours as needed for migraine.   . vancomycin (VANCOCIN) 50 mg/mL oral solution Take 2.5 mLs (125 mg total) by mouth every 6 (six) hours. X 14 days. Dispense qty suffcient  . zolpidem (AMBIEN) 5 MG tablet Take 1 tablet (5 mg total) by mouth at bedtime as needed for sleep.   No facility-administered encounter medications on file as of 08/20/2016.      Patient Active Problem List   Diagnosis Date Noted  . C. difficile colitis 05/11/2016  . Essential hypertension 05/11/2016  . Anxiety state 05/11/2016  . Clostridium difficile diarrhea   . Clostridium difficile infection 03/27/2016  . Antibiotic enterocolitis 03/25/2016  . Migraine 02/28/2013  . Hemorrhoids, external, thrombosed 11/05/2011     Health Maintenance Due  Topic Date Due  . TETANUS/TDAP  06/07/1951  . ZOSTAVAX  06/06/1992  . DEXA SCAN  06/06/1997  . PNA vac Low Risk Adult (1 of 2 - PCV13) 06/06/1997     Review of Systems 10 point ros is negative Physical Exam   BP (!) 146/77   Pulse 72   Temp 97.8 F (36.6 C) (Oral)   Wt 102 lb (46.3 kg)   SpO2 99%   BMI 18.66 kg/m   No exam today CBC Lab Results  Component Value Date   WBC 11.2 (H) 05/13/2016   RBC 3.63 (L) 05/13/2016   HGB 11.0 (L) 05/13/2016   HCT 33.4 (L) 05/13/2016   PLT 254 05/13/2016   MCV 92.0 05/13/2016   MCH 30.3 05/13/2016   MCHC 32.9 05/13/2016   RDW 13.3 05/13/2016   LYMPHSABS 3.1 05/13/2016   MONOABS 0.8 05/13/2016   EOSABS 0.4 05/13/2016   BASOSABS 0.0 05/13/2016   BMET Lab Results  Component Value Date  NA 140 05/13/2016   K 3.5 05/13/2016   CL 112 (H) 05/13/2016   CO2 25 05/13/2016   GLUCOSE 97 05/13/2016   BUN 10 05/13/2016   CREATININE 0.84 05/13/2016   CALCIUM 8.3 (L) 05/13/2016   GFRNONAA >60 05/13/2016   GFRAA >60 05/13/2016     Assessment and Plan  Administered FMT pills under DOT  Took 1 test pill without difficulty  Started thaw at 10:06, completed taking 30 pills at 10:35 without difficulty  Will call on 12/11 to see how she is doing  Will arrange for rtc in 2 months  Spent 45 min with patient in direct patient care for DOT for recurrent cdifficile management

## 2016-08-26 NOTE — Progress Notes (Signed)
[id number] cdifficile  Patient ID: Christina Escobar, female   DOB: 1930-05-09, 82 y.o.   MRN: [id number]  HPI 82yo F cwith recurrent cdifficile infection. Currently on prolonged oral vancomycin taper, on TID dosing presently in anticipation to get oral capsules FMT. She states a few days ago, she had numerous BM but yesterday only had 2. No abdominal cramping. Avoiding lactose to minimize diarrhea.  Outpatient Encounter Prescriptions as of 08/13/2016  Medication Sig  . acetaminophen (TYLENOL) 500 MG tablet Take 1,000 mg by mouth 2 (two) times daily.   Marland Kitchen acidophilus (RISAQUAD) CAPS capsule Take 1 capsule by mouth daily.  Marland Kitchen amLODipine (NORVASC) 5 MG tablet Take 5 mg by mouth daily.  . Calcium Carbonate-Vitamin D3 (CALCIUM 600-D) 600-400 MG-UNIT TABS Take 1 tablet by mouth 2 (two) times daily with breakfast and lunch.  . denosumab (PROLIA) 60 MG/ML SOLN injection Inject 60 mg into the skin every 6 (six) months.  . Lactase (LACTAID PO) Take by mouth as needed.  . loratadine (CLARITIN) 10 MG tablet Take 10 mg by mouth daily.  Marland Kitchen LORazepam (ATIVAN) 0.5 MG tablet Take 0.5 mg by mouth every evening. Pt takes this dose at 5pm.  . LORazepam (ATIVAN) 1 MG tablet Take 1 mg by mouth 2 (two) times daily at 8 am and 10 pm.  . mirtazapine (REMERON) 45 MG tablet Take 45 mg by mouth at bedtime.   . Multiple Vitamin (MULTIVITAMIN WITH MINERALS) TABS tablet Take 1 tablet by mouth daily.  . quinapril (ACCUPRIL) 40 MG tablet Take 40 mg by mouth Daily.   . SUMAtriptan (IMITREX) 25 MG tablet Take 25 mg by mouth every 2 (two) hours as needed for migraine.   . vancomycin (VANCOCIN) 50 mg/mL oral solution Take 2.5 mLs (125 mg total) by mouth every 6 (six) hours. X 14 days. Dispense qty suffcient  . zolpidem (AMBIEN) 5 MG tablet Take 1 tablet (5 mg total) by mouth at bedtime as needed for sleep.   No facility-administered encounter medications on file as of 08/13/2016.      Patient Active Problem List   Diagnosis Date Noted  . C. difficile colitis 05/11/2016  . Essential hypertension 05/11/2016  . Anxiety state 05/11/2016  . Clostridium difficile diarrhea   . Clostridium difficile infection 03/27/2016  . Antibiotic enterocolitis 03/25/2016  . Migraine 02/28/2013  . Hemorrhoids, external, thrombosed 11/05/2011     Health Maintenance Due  Topic Date Due  . TETANUS/TDAP  06/07/1951  . ZOSTAVAX  06/06/1992  . DEXA SCAN  06/06/1997  . PNA vac Low Risk Adult (1 of 2 - PCV13) 06/06/1997     Review of Systems + diarrhea, otherwise 10 point ros is negative Physical Exam   BP (!) 147/71   Pulse 72   Temp 97.8 F (36.6 C) (Oral)   Ht 5\' 2"  (1.575 m)   Wt 102 lb 8 oz (46.5 kg)   BMI 18.75 kg/m  Physical Exam  Constitutional:  oriented to person, place, and time. appears well-developed and well-nourished. No distress.  HENT: [place]/AT, PERRLA, no scleral icterus Mouth/Throat: Oropharynx is clear and moist. No oropharyngeal exudate.  Cardiovascular: Normal rate, regular rhythm and normal heart sounds. Exam reveals no gallop and no friction rub.  No murmur heard.  Pulmonary/Chest: Effort normal and breath sounds normal. No respiratory distress.  has no wheezes.  Neck = supple, no nuchal rigidity Abdominal: Soft. Bowel sounds are normal.  exhibits no distension. There is no tenderness.  Lymphadenopathy: no cervical adenopathy. No axillary  adenopathy Neurological: alert and oriented to person, place, and time.  Skin: Skin is warm and dry. No rash noted. No erythema.  Psychiatric: a normal mood and affect.  behavior is normal.   CBC Lab Results  Component Value Date   WBC 11.2 (H) 05/13/2016   RBC 3.63 (L) 05/13/2016   HGB 11.0 (L) 05/13/2016   HCT 33.4 (L) 05/13/2016   PLT 254 05/13/2016   MCV 92.0 05/13/2016   MCH 30.3 05/13/2016   MCHC 32.9 05/13/2016   RDW 13.3 05/13/2016   LYMPHSABS 3.1 05/13/2016   MONOABS 0.8 05/13/2016   EOSABS 0.4 05/13/2016   BASOSABS 0.0 05/13/2016     BMET Lab Results  Component Value Date   NA 140 05/13/2016   K 3.5 05/13/2016   CL 112 (H) 05/13/2016   CO2 25 05/13/2016   GLUCOSE 97 05/13/2016   BUN 10 05/13/2016   CREATININE 0.84 05/13/2016   CALCIUM 8.3 (L) 05/13/2016   GFRNONAA >60 05/13/2016   GFRAA >60 05/13/2016     Assessment and Plan Plan to do FMT and we will give her instructions today to when to stop oral vancomycin. Start ppi x 2 days prior to [place]. On day of FMT to be NPO for 2 hrs before the procedure.  She has signed consent for the oral capsule FMT and understands that they will be billed for procurement of material.

## 2016-09-01 ENCOUNTER — Telehealth: Payer: Self-pay | Admitting: *Deleted

## 2016-09-01 NOTE — Telephone Encounter (Signed)
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  Diarrhea started 2 days ago, started Imodium.  Has slight fever now.  Asking if there is anything she needs to be doing or not doing.  Would appreciate a call from Dr. Baxter Flattery.  [phone].

## 2016-09-02 ENCOUNTER — Other Ambulatory Visit: Payer: Medicare Other

## 2016-09-02 DIAGNOSIS — R197 Diarrhea, unspecified: Secondary | ICD-10-CM

## 2016-09-02 NOTE — Telephone Encounter (Signed)
I spoke with the patient on 12/19. She had one small mucousy stool and was concern that it was cdiff. I have asked her to continue to monitor her bowel movements. If she has 3 watery stools in 24hr, then we will have her bring in stool specimen to test for cdifficile

## 2016-09-03 ENCOUNTER — Other Ambulatory Visit: Payer: Medicare Other

## 2016-09-03 LAB — CLOSTRIDIUM DIFFICILE BY PCR

## 2016-09-04 ENCOUNTER — Telehealth: Payer: Self-pay

## 2016-09-04 ENCOUNTER — Inpatient Hospital Stay (HOSPITAL_BASED_OUTPATIENT_CLINIC_OR_DEPARTMENT_OTHER)
Admission: EM | Admit: 2016-09-04 | Discharge: 2016-09-08 | DRG: 373 | Disposition: A | Discharge status: Home / Self Care | Payer: Medicare Other | Attending: Internal Medicine | Admitting: Internal Medicine

## 2016-09-04 ENCOUNTER — Encounter (HOSPITAL_BASED_OUTPATIENT_CLINIC_OR_DEPARTMENT_OTHER): Payer: Self-pay | Admitting: *Deleted

## 2016-09-04 ENCOUNTER — Emergency Department (HOSPITAL_BASED_OUTPATIENT_CLINIC_OR_DEPARTMENT_OTHER): Payer: Medicare Other

## 2016-09-04 DIAGNOSIS — F411 Generalized anxiety disorder: Secondary | ICD-10-CM | POA: Diagnosis present

## 2016-09-04 DIAGNOSIS — A09 Infectious gastroenteritis and colitis, unspecified: Secondary | ICD-10-CM | POA: Diagnosis not present

## 2016-09-04 DIAGNOSIS — R197 Diarrhea, unspecified: Secondary | ICD-10-CM

## 2016-09-04 DIAGNOSIS — Z85828 Personal history of other malignant neoplasm of skin: Secondary | ICD-10-CM

## 2016-09-04 DIAGNOSIS — Z79899 Other long term (current) drug therapy: Secondary | ICD-10-CM

## 2016-09-04 DIAGNOSIS — Z9842 Cataract extraction status, left eye: Secondary | ICD-10-CM

## 2016-09-04 DIAGNOSIS — A0472 Enterocolitis due to Clostridium difficile, not specified as recurrent: Secondary | ICD-10-CM | POA: Diagnosis not present

## 2016-09-04 DIAGNOSIS — Z808 Family history of malignant neoplasm of other organs or systems: Secondary | ICD-10-CM

## 2016-09-04 DIAGNOSIS — E876 Hypokalemia: Secondary | ICD-10-CM | POA: Diagnosis present

## 2016-09-04 DIAGNOSIS — F329 Major depressive disorder, single episode, unspecified: Secondary | ICD-10-CM | POA: Diagnosis present

## 2016-09-04 DIAGNOSIS — Z9889 Other specified postprocedural states: Secondary | ICD-10-CM

## 2016-09-04 DIAGNOSIS — A0471 Enterocolitis due to Clostridium difficile, recurrent: Principal | ICD-10-CM | POA: Diagnosis present

## 2016-09-04 DIAGNOSIS — I10 Essential (primary) hypertension: Secondary | ICD-10-CM | POA: Diagnosis present

## 2016-09-04 DIAGNOSIS — Z9049 Acquired absence of other specified parts of digestive tract: Secondary | ICD-10-CM

## 2016-09-04 DIAGNOSIS — Z820 Family history of epilepsy and other diseases of the nervous system: Secondary | ICD-10-CM

## 2016-09-04 DIAGNOSIS — Z888 Allergy status to other drugs, medicaments and biological substances status: Secondary | ICD-10-CM

## 2016-09-04 DIAGNOSIS — Z8249 Family history of ischemic heart disease and other diseases of the circulatory system: Secondary | ICD-10-CM

## 2016-09-04 DIAGNOSIS — Z9841 Cataract extraction status, right eye: Secondary | ICD-10-CM

## 2016-09-04 DIAGNOSIS — D3502 Benign neoplasm of left adrenal gland: Secondary | ICD-10-CM | POA: Diagnosis present

## 2016-09-04 DIAGNOSIS — F32A Depression, unspecified: Secondary | ICD-10-CM | POA: Diagnosis present

## 2016-09-04 LAB — BASIC METABOLIC PANEL
Anion gap: 8 (ref 5–15)
BUN: 18 mg/dL (ref 6–20)
CO2: 25 mmol/L (ref 22–32)
Calcium: 9.3 mg/dL (ref 8.9–10.3)
Chloride: 108 mmol/L (ref 101–111)
Creatinine, Ser: 0.74 mg/dL (ref 0.44–1.00)
GFR calc Af Amer: >60 mL/min (ref >60)
GLUCOSE: 114 mg/dL — AB (ref 65–99)
POTASSIUM: 3.4 mmol/L — AB (ref 3.5–5.1)
Sodium: 141 mmol/L (ref 135–145)

## 2016-09-04 LAB — CBC WITH DIFFERENTIAL/PLATELET
BASOS ABS: 0 10*3/uL (ref 0.0–0.1)
BASOS PCT: 0 %
EOS PCT: 2 %
Eosinophils Absolute: 0.2 10*3/uL (ref 0.0–0.7)
HEMATOCRIT: 36.5 % (ref 36.0–46.0)
Hemoglobin: 12.1 g/dL (ref 12.0–15.0)
LYMPHS ABS: 2.2 10*3/uL (ref 0.7–4.0)
LYMPHS PCT: 21 %
MCH: 31.3 pg (ref 26.0–34.0)
MCHC: 33.2 g/dL (ref 30.0–36.0)
MCV: 94.3 fL (ref 78.0–100.0)
MONOS PCT: 10 %
Monocytes Absolute: 1.1 10*3/uL — ABNORMAL HIGH (ref 0.1–1.0)
NEUTROS ABS: 7.2 10*3/uL (ref 1.7–7.7)
Neutrophils Relative %: 67 %
PLATELETS: 230 10*3/uL (ref 150–400)
RBC: 3.87 MIL/uL (ref 3.87–5.11)
RDW: 12.1 % (ref 11.5–15.5)
WBC: 10.7 10*3/uL — ABNORMAL HIGH (ref 4.0–10.5)

## 2016-09-04 MED ORDER — SODIUM CHLORIDE 0.9 % IV SOLN
Freq: Once | INTRAVENOUS | Status: AC
  Administered 2016-09-05: 02:00:00 via INTRAVENOUS

## 2016-09-04 MED ORDER — ONDANSETRON HCL 4 MG/2ML IJ SOLN
INTRAMUSCULAR | Status: AC
  Filled 2016-09-04: qty 2

## 2016-09-04 MED ORDER — POTASSIUM CHLORIDE CRYS ER 20 MEQ PO TBCR
40.0000 meq | EXTENDED_RELEASE_TABLET | Freq: Once | ORAL | Status: AC
  Administered 2016-09-04: 40 meq via ORAL
  Filled 2016-09-04: qty 2

## 2016-09-04 MED ORDER — SODIUM CHLORIDE 0.9 % IV BOLUS (SEPSIS)
1000.0000 mL | Freq: Once | INTRAVENOUS | Status: AC
  Administered 2016-09-04: 1000 mL via INTRAVENOUS

## 2016-09-04 MED ORDER — IOPAMIDOL (ISOVUE-300) INJECTION 61%
100.0000 mL | Freq: Once | INTRAVENOUS | Status: AC | PRN
  Administered 2016-09-04: 100 mL via INTRAVENOUS

## 2016-09-04 MED ORDER — ONDANSETRON HCL 4 MG/2ML IJ SOLN
4.0000 mg | Freq: Once | INTRAMUSCULAR | Status: AC
  Administered 2016-09-04: 4 mg via INTRAVENOUS

## 2016-09-04 NOTE — Telephone Encounter (Signed)
I have spoken with the patient and have asked her to go to local ED for evaluation. I have called of the [facility] to give them update on patient and recs to retest for cdifficile

## 2016-09-04 NOTE — ED Provider Notes (Signed)
[facility] Provider Note   CSN: [id number] Arrival date & time: 09/04/16  1621  By signing my name below, I, Dora Sims, attest that this documentation has been prepared under the direction and in the presence of physician practitioner, Sherwood Gambler, MD. Electronically Signed: Dora Sims, Scribe. 09/04/2016. 6:47 PM.  History   Chief Complaint Chief Complaint  Patient presents with  . Diarrhea    The history is provided by the patient. No language interpreter was used.     HPI Comments: NEHEMIE NOREEN is a 82 y.o. female who presents to the Emergency Department complaining of persistent, worsening diarrhea beginning 5 days ago. She states she has been having an increased frequency of bowel movements and notes some are loose and some are normal. She reports a h/o C diff colitis (5 episodes) and has had multiple hospitalizations for this over the past few months. She is unsure if her current symptoms feel like C diff. Pt reports abdominal pain secondary to bowel movements as well as some rectal bleeding secondary to hemorrhoids. She notes she had took a fecal transplant orally 2 weeks ago. She states she has noticed both stool and mucous come from her rectum during bowel movements. She last took antibiotics 17 days ago prior to her fecal transplant. She denies nausea, vomiting, or any other associated symptoms.  Past Medical History:  Diagnosis Date  . Allergy   . Anal fissure   . Anxiety   . Basal cell carcinoma   . Cataracts, bilateral   . Clostridium difficile diarrhea   . Depression   . Diverticulosis    hx of stricture in colon  . Hemorrhoids   . Hypertension   . IBS (irritable bowel syndrome)   . Insomnia   . Migraine   . Osteopenia   . Thrombosed hemorrhoids     Patient Active Problem List   Diagnosis Date Noted  . Depression 09/04/2016  . Hypokalemia 09/04/2016  . C. difficile colitis 05/11/2016  . Essential hypertension 05/11/2016  .  Anxiety state 05/11/2016  . Clostridium difficile diarrhea   . Clostridium difficile infection 03/27/2016  . Antibiotic enterocolitis 03/25/2016  . Migraine 02/28/2013  . Hemorrhoids, external, thrombosed 11/05/2011    Past Surgical History:  Procedure Laterality Date  . APPENDECTOMY    . BASAL CELL CARCINOMA EXCISION  [id number]  . CATARACT EXTRACTION, BILATERAL Bilateral 1999  . COLON RESECTION  2001  . INCISION AND DRAINAGE  11/05/2011   thrombosed hemorrhoid  . TRANSANAL EXCISION OF RECTAL MASS WITH HEMORRHOID INJECTION  2007   anal fissure    OB History    No data available       Home Medications    Prior to Admission medications   Medication Sig Start Date End Date Taking? Authorizing Provider  acidophilus (RISAQUAD) CAPS capsule Take 1 capsule by mouth daily.   Yes Historical Provider, MD  amLODipine (NORVASC) 5 MG tablet Take 5 mg by mouth daily.   Yes Historical Provider, MD  Calcium Carbonate-Vitamin D3 (CALCIUM 600-D) 600-400 MG-UNIT TABS Take 1 tablet by mouth 2 (two) times daily with breakfast and lunch.   Yes Historical Provider, MD  denosumab (PROLIA) 60 MG/ML SOLN injection Inject 60 mg into the skin every 6 (six) months.   Yes Historical Provider, MD  loratadine (CLARITIN) 10 MG tablet Take 10 mg by mouth daily.   Yes Historical Provider, MD  LORazepam (ATIVAN) 0.5 MG tablet Take 0.5 mg by mouth every evening. Pt takes this dose  at 5pm.   Yes Historical Provider, MD  LORazepam (ATIVAN) 1 MG tablet Take 1 mg by mouth 2 (two) times daily at 8 am and 10 pm.   Yes Historical Provider, MD  mirtazapine (REMERON) 45 MG tablet Take 45 mg by mouth at bedtime.    Yes Historical Provider, MD  Multiple Vitamin (MULTIVITAMIN WITH MINERALS) TABS tablet Take 1 tablet by mouth daily.   Yes Historical Provider, MD  quinapril (ACCUPRIL) 40 MG tablet Take 40 mg by mouth Daily.    Yes Historical Provider, MD  SUMAtriptan (IMITREX) 25 MG tablet Take 25 mg by mouth every 2 (two)  hours as needed for migraine.    Yes Historical Provider, MD  zolpidem (AMBIEN) 5 MG tablet Take 1 tablet (5 mg total) by mouth at bedtime as needed for sleep. 05/14/16  Yes Velvet Bathe, MD  acetaminophen (TYLENOL) 500 MG tablet Take 1,000 mg by mouth 2 (two) times daily.     Historical Provider, MD  Lactase (LACTAID PO) Take by mouth as needed.    Historical Provider, MD  vancomycin (VANCOCIN) 50 mg/mL oral solution Take 2.5 mLs (125 mg total) by mouth every 6 (six) hours. X 14 days. Dispense qty suffcient 08/04/16   Carlyle Basques, MD    Family History Family History  Problem Relation Age of Onset  . Alzheimer's disease Mother   . Heart failure Mother   . Heart disease Father   . Heart failure Father   . Other Maternal Grandfather     cerebral hemorrhage  . Skin cancer Brother   . Heart disease Brother     Social History Social History  Substance Use Topics  . Smoking status: Never Smoker  . Smokeless tobacco: Never Used  . Alcohol use 0.0 oz/week     Comment: occasional glass of wine -2 per week     Allergies   Erythromycin; Flagyl [metronidazole]; and Meloxicam   Review of Systems Review of Systems  Gastrointestinal: Positive for abdominal pain, anal bleeding (hemorrhoids) and diarrhea. Negative for nausea and vomiting.  All other systems reviewed and are negative.    Physical Exam Updated Vital Signs BP (!) 146/51   Pulse 88   Temp 99.1 F (37.3 C) (Oral)   Resp 18   Ht 5\' 2"  (1.575 m)   Wt 102 lb (46.3 kg)   SpO2 99%   BMI 18.66 kg/m   Physical Exam  Constitutional: She is oriented to person, place, and time. She appears well-developed and well-nourished.  HENT:  Head: Normocephalic and atraumatic.  Right Ear: External ear normal.  Left Ear: External ear normal.  Nose: Nose normal.  Eyes: Right eye exhibits no discharge. Left eye exhibits no discharge.  Cardiovascular: Normal rate, regular rhythm and normal heart sounds.   Pulmonary/Chest: Effort  normal and breath sounds normal.  Abdominal: Soft. She exhibits distension. There is tenderness.  Diffuse lower abdominal tenderness with mild distension.  Neurological: She is alert and oriented to person, place, and time.  Skin: Skin is warm and dry.  Nursing note and vitals reviewed.    ED Treatments / Results  Labs (all labs ordered are listed, but only abnormal results are displayed) Labs Reviewed  BASIC METABOLIC PANEL - Abnormal; Notable for the following:       Result Value   Potassium 3.4 (*)    Glucose, Bld 114 (*)    All other components within normal limits  CBC WITH DIFFERENTIAL/PLATELET - Abnormal; Notable for the following:  WBC 10.7 (*)    Monocytes Absolute 1.1 (*)    All other components within normal limits  C DIFFICILE QUICK SCREEN W PCR REFLEX    EKG  EKG Interpretation None       Radiology Ct Abdomen Pelvis W Contrast  Result Date: 09/04/2016 CLINICAL DATA:  Celsius difficile colitis, post fecal transplant, loose stools for 6 days, lower abdominal pain, history irritable bowel syndrome EXAM: CT ABDOMEN AND PELVIS WITH CONTRAST TECHNIQUE: Multidetector CT imaging of the abdomen and pelvis was performed using the standard protocol following bolus administration of intravenous contrast. Sagittal and coronal MPR images reconstructed from axial data set. CONTRAST:  152mL ISOVUE-300 IOPAMIDOL (ISOVUE-300) INJECTION 61% IV. Dilute oral contrast. COMPARISON:  03/25/2016 FINDINGS: Lower chest: Minimal dependent atelectasis RIGHT lower lobe. Hepatobiliary: 4 mm RIGHT lobe liver cyst.  Otherwise unremarkable. Pancreas: Normal appearance Spleen: Normal appearance Adrenals/Urinary Tract: BILATERAL renal cysts. LEFT adrenal mass again identified 2.2 x 1.5 cm demonstrating significant washout on delayed images consistent with adrenal adenoma. RIGHT adrenal gland normal appearance. Ureters and bladder unremarkable. Stomach/Bowel: Appendix surgically absent by history.  Mild scattered wall thickening of the colon particularly RIGHT colon and transverse colon. Stomach and remaining bowel loops unremarkable. Vascular/Lymphatic: Atherosclerotic calcification aorta without aneurysm. Few coronary arterial calcifications noted. No definite abdominal or pelvic adenopathy. Reproductive: Atrophic uterus with unremarkable adnexa Other: No free air or free fluid.  Ventral hernias containing fat. Musculoskeletal: No acute osseous findings. IMPRESSION: Hepatic and BILATERAL renal cysts. LEFT adrenal adenoma 2.2 x 1.5 cm. Mild scattered wall thickening of the colon suspicious for colitis. Aortic atherosclerosis and minimal coronary arterial calcification. Remainder of exam unremarkable. Electronically Signed   By: Lavonia Dana M.D.   On: 09/04/2016 21:41    Procedures Procedures (including critical care time)  DIAGNOSTIC STUDIES: Oxygen Saturation is 99% on RA, normal by my interpretation.    COORDINATION OF CARE: 6:54 PM Discussed treatment plan with pt at bedside and pt agreed to plan.  Medications Ordered in ED Medications  0.9 %  sodium chloride infusion (not administered)  sodium chloride 0.9 % bolus 1,000 mL (0 mLs Intravenous Stopped 09/04/16 1956)  potassium chloride SA (K-DUR,KLOR-CON) CR tablet 40 mEq (40 mEq Oral Given 09/04/16 2002)  ondansetron (ZOFRAN) injection 4 mg (4 mg Intravenous Given 09/04/16 1952)  iopamidol (ISOVUE-300) 61 % injection 100 mL (100 mLs Intravenous Contrast Given 09/04/16 2113)     Initial Impression / Assessment and Plan / ED Course  I have reviewed the triage vital signs and the nursing notes.  Pertinent labs & imaging results that were available during my care of the patient were reviewed by me and considered in my medical decision making (see chart for details).  Clinical Course as of Sep 05 2331  Fri Sep 04, 2016  1855 Labs, CT, declines pain meds, no current nausea  [SG]  2329 Dr Blaine Hamper to accept to med surg obs at [name]  [SG]      Clinical Course User Index [SG] Sherwood Gambler, MD    Patient's CT shows possible mild colitis. Patient is uncomfortable but no distress. Given complex situation (fecal transplant prior history of c-diff) I consulted ID, Dr. Tommy Medal who recommend overnight admission/observation and ID consult in AM. The c-diff was sent but is batched and won't be run until tomorrow. ID recommends holding on antibiotics for now. Fluids, admit to [facility] for overnight obs.  Final Clinical Impressions(s) / ED Diagnoses   Final diagnoses:  Diarrhea of presumed infectious origin  New Prescriptions New Prescriptions   No medications on file   I personally performed the services described in this documentation, which was scribed in my presence. The recorded information has been reviewed and is accurate.    Sherwood Gambler, MD 09/04/16 [phone]

## 2016-09-04 NOTE — Telephone Encounter (Signed)
Pt's husband called stating the Mrs. Christina Escobar has been having diarrhea symptoms all day and needed to speak with Dr.Snider. [name] says that he needs to know what to do over the weekend to help with his wife's symptoms and was told that Dr.Snider would be calling him back later this evening to advise. Callback number given is [phone]. Please advise Kennieth Rad CMA

## 2016-09-04 NOTE — ED Triage Notes (Signed)
Pt reports hx of cdiff with multiple hospitalizations. Has had fecal transplant and is followed by ID (Dr. Baxter Flattery). Reports she has been having looses stools 6 days. Attempted to provided out patient stool sample this week but was informed by the lab that it was not adequate for testing. She is here with her husband for evaluation. They live in independent living at [facility]

## 2016-09-04 NOTE — ED Notes (Signed)
Patient drinking contrast

## 2016-09-04 NOTE — H&P (Signed)
History and Physical    Christina Escobar [id number] DOB: 02/08/1930 DOA: 09/04/2016  Referring MD/NP/PA:   PCP: Haywood Pao, MD   Patient coming from:  The patient is coming from independent living at [facility]. At baseline, pt is independent for most of ADL.   Chief Complaint: Diarrhea and abdominal pain  HPI: Christina Escobar is a 82 y.o. female with medical history significant of recurrent C. difficile colitis, hypertension, depression, anxiety, IBS, diverticulosis, migraine headache, who presents with diarrhea and abdominal pain.  Pt has hx of cdiff with multiple hospitalizations. Patient has been followed up by ID, Dr. Baxter Flattery. He had fecal transplant 2 weeks ago. She recently completed two-week course of vancomycin, stopped on 08/18/16. Pt states that she has worsening diarrhea in the past 6 days. She has 10-12 times of bowel movements with loose stool. She had one episode of diarrhea with mucus and little blood. She has mild lower abdominal pain, no nausea, vomiting. She has mild subjective fever and chills. She denies chest pain, shortness of breath, symptoms of UTI or unilateral weakness.  ED Course: pt was found to have WBC 10.7, potassium 3.4, lactate 0.9, creatinine normal, temperature 99.1. CT abdomen/pelvis showed mild scattered wall thickening of the colon suspicious for colitis; hepatic and BILATERAL renal cysts; left adrenal adenoma 2.2 x 1.5 cm. Pt is placed on med-surg bed for obs. ID, Dr. Drucilla Schmidt was consulted by EDP.  Review of Systems:   General: Subjective fevers, chills, no changes in body weight, has poor appetite, has fatigue HEENT: no blurry vision, hearing changes or sore throat Respiratory: no dyspnea, coughing, wheezing CV: no chest pain, no palpitations GI: no nausea, vomiting, has abdominal pain, diarrhea, no constipation GU: no dysuria, burning on urination, increased urinary frequency, hematuria  Ext: no leg edema Neuro: no unilateral  weakness, numbness, or tingling, no vision change or hearing loss Skin: no rash, no skin tear. MSK: No muscle spasm, no deformity, no limitation of range of movement in spin Heme: No easy bruising.  Travel history: No recent long distant travel.  Allergy:  Allergies  Allergen Reactions  . Erythromycin Nausea And Vomiting  . Flagyl [Metronidazole] Diarrhea  . Meloxicam Other (See Comments)    Reaction:  Dizziness   . Other     Broad spectrum antibiotic, does not know the name ----c diff    Past Medical History:  Diagnosis Date  . Allergy   . Anal fissure   . Anxiety   . Basal cell carcinoma   . Cataracts, bilateral   . Clostridium difficile diarrhea   . Depression   . Diverticulosis    hx of stricture in colon  . Hemorrhoids   . Hypertension   . IBS (irritable bowel syndrome)   . Insomnia   . Migraine   . Osteopenia   . Thrombosed hemorrhoids     Past Surgical History:  Procedure Laterality Date  . APPENDECTOMY    . BASAL CELL CARCINOMA EXCISION  [id number]  . CATARACT EXTRACTION, BILATERAL Bilateral 1999  . COLON RESECTION  2001  . INCISION AND DRAINAGE  11/05/2011   thrombosed hemorrhoid  . TRANSANAL EXCISION OF RECTAL MASS WITH HEMORRHOID INJECTION  2007   anal fissure    Social History:  reports that she has never smoked. She has never used smokeless tobacco. She reports that she drinks alcohol. She reports that she does not use drugs.  Family History:  Family History  Problem Relation Age of Onset  . Alzheimer's  disease Mother   . Heart failure Mother   . Heart disease Father   . Heart failure Father   . Other Maternal Grandfather     cerebral hemorrhage  . Skin cancer Brother   . Heart disease Brother      Prior to Admission medications   Medication Sig Start Date End Date Taking? Authorizing Provider  acidophilus (RISAQUAD) CAPS capsule Take 1 capsule by mouth daily.   Yes Historical Provider, MD  amLODipine (NORVASC) 5 MG tablet Take 5 mg by  mouth daily.   Yes Historical Provider, MD  Calcium Carbonate-Vitamin D3 (CALCIUM 600-D) 600-400 MG-UNIT TABS Take 1 tablet by mouth 2 (two) times daily with breakfast and lunch.   Yes Historical Provider, MD  denosumab (PROLIA) 60 MG/ML SOLN injection Inject 60 mg into the skin every 6 (six) months.   Yes Historical Provider, MD  loratadine (CLARITIN) 10 MG tablet Take 10 mg by mouth daily.   Yes Historical Provider, MD  LORazepam (ATIVAN) 0.5 MG tablet Take 0.5 mg by mouth every evening. Pt takes this dose at 5pm.   Yes Historical Provider, MD  LORazepam (ATIVAN) 1 MG tablet Take 1 mg by mouth 2 (two) times daily at 8 am and 10 pm.   Yes Historical Provider, MD  mirtazapine (REMERON) 45 MG tablet Take 45 mg by mouth at bedtime.    Yes Historical Provider, MD  Multiple Vitamin (MULTIVITAMIN WITH MINERALS) TABS tablet Take 1 tablet by mouth daily.   Yes Historical Provider, MD  quinapril (ACCUPRIL) 40 MG tablet Take 40 mg by mouth Daily.    Yes Historical Provider, MD  SUMAtriptan (IMITREX) 25 MG tablet Take 25 mg by mouth every 2 (two) hours as needed for migraine.    Yes Historical Provider, MD  zolpidem (AMBIEN) 5 MG tablet Take 1 tablet (5 mg total) by mouth at bedtime as needed for sleep. 05/14/16  Yes Velvet Bathe, MD  acetaminophen (TYLENOL) 500 MG tablet Take 1,000 mg by mouth 2 (two) times daily.     Historical Provider, MD  Lactase (LACTAID PO) Take by mouth as needed.    Historical Provider, MD  vancomycin (VANCOCIN) 50 mg/mL oral solution Take 2.5 mLs (125 mg total) by mouth every 6 (six) hours. X 14 days. Dispense qty suffcient 08/04/16   Carlyle Basques, MD    Physical Exam: Vitals:   09/04/16 1633 09/04/16 1902 09/05/16 0008 09/05/16 0125  BP:  (!) 146/51 106/68 (!) 143/57  Pulse:  88 93 80  Resp:  18 16 18   Temp:    98.2 F (36.8 C)  TempSrc:    Oral  SpO2:  99% 96% 99%  Weight: 46.3 kg (102 lb)     Height: 5\' 2"  (1.575 m)      General: Not in acute distress HEENT:        Eyes: PERRL, EOMI, no scleral icterus.       ENT: No discharge from the ears and nose, no pharynx injection, no tonsillar enlargement.        Neck: No JVD, no bruit, no mass felt. Heme: No neck lymph node enlargement. Cardiac: S1/S2, RRR, No murmurs, No gallops or rubs. Respiratory:  No rales, wheezing, rhonchi or rubs. GI: Soft, nondistended, has mild tenderness in lower abdomen, no rebound pain, no organomegaly, BS present. GU: No hematuria Ext: No pitting leg edema bilaterally. 2+DP/PT pulse bilaterally. Musculoskeletal: No joint deformities, No joint redness or warmth, no limitation of ROM in spin. Skin: No rashes.  Neuro: Alert, oriented X3, cranial nerves II-XII grossly intact, moves all extremities normally. Psych: Patient is not psychotic, no suicidal or hemocidal ideation.  Labs on Admission: I have personally reviewed following labs and imaging studies  CBC:  Recent Labs Lab 09/04/16 1900  WBC 10.7*  NEUTROABS 7.2  HGB 12.1  HCT 36.5  MCV 94.3  PLT 123456   Basic Metabolic Panel:  Recent Labs Lab 09/04/16 1900  NA 141  K 3.4*  CL 108  CO2 25  GLUCOSE 114*  BUN 18  CREATININE 0.74  CALCIUM 9.3   GFR: Estimated Creatinine Clearance: 38.3 mL/min (by C-G formula based on SCr of 0.74 mg/dL). Liver Function Tests: No results for input(s): AST, ALT, ALKPHOS, BILITOT, PROT, ALBUMIN in the last 168 hours. No results for input(s): LIPASE, AMYLASE in the last 168 hours. No results for input(s): AMMONIA in the last 168 hours. Coagulation Profile: No results for input(s): INR, PROTIME in the last 168 hours. Cardiac Enzymes: No results for input(s): CKTOTAL, CKMB, CKMBINDEX, TROPONINI in the last 168 hours. BNP (last 3 results) No results for input(s): PROBNP in the last 8760 hours. HbA1C: No results for input(s): HGBA1C in the last 72 hours. CBG: No results for input(s): GLUCAP in the last 168 hours. Lipid Profile: No results for input(s): CHOL, HDL, LDLCALC,  TRIG, CHOLHDL, LDLDIRECT in the last 72 hours. Thyroid Function Tests: No results for input(s): TSH, T4TOTAL, FREET4, T3FREE, THYROIDAB in the last 72 hours. Anemia Panel: No results for input(s): VITAMINB12, FOLATE, FERRITIN, TIBC, IRON, RETICCTPCT in the last 72 hours. Urine analysis:    Component Value Date/Time   COLORURINE YELLOW 04/18/2016 0950   APPEARANCEUR HAZY (A) 04/18/2016 0950   LABSPEC 1.016 04/18/2016 0950   PHURINE 6.0 04/18/2016 0950   GLUCOSEU NEGATIVE 04/18/2016 0950   HGBUR SMALL (A) 04/18/2016 0950   BILIRUBINUR NEGATIVE 04/18/2016 0950   KETONESUR 40 (A) 04/18/2016 0950   PROTEINUR NEGATIVE 04/18/2016 0950   UROBILINOGEN 0.2 09/14/2012 1751   NITRITE NEGATIVE 04/18/2016 0950   LEUKOCYTESUR MODERATE (A) 04/18/2016 0950   Sepsis Labs: @LABRCNTIP (procalcitonin:4,lacticidven:4) ) Recent Results (from the past 240 hour(s))  Clostridium Difficile by PCR     Status: None   Collection Time: 09/02/16 12:08 PM  Result Value Ref Range Status   Toxigenic C Difficile by pcr CANCELED Not Detected     Comment: After repeat analysis, non-amplification of the internal control suggests the presence of PCR inhibitors in the patient sample. An additional sample should be submitted for testing if clinically warranted. This test is for use only with liquid or soft stools; performance characteristics of other clinical specimen types have not been established.   This assay was performed by Cepheid GeneXpert(R) PCR. The performance characteristics of this assay have been determined by [employer]. Performance characteristics refer to the analytical performance of the test.  Result canceled by the ancillary   C difficile quick scan w PCR reflex     Status: Abnormal   Collection Time: 09/04/16  8:50 PM  Result Value Ref Range Status   C Diff antigen POSITIVE (A) NEGATIVE Final   C Diff toxin POSITIVE (A) NEGATIVE Final   C Diff interpretation Toxin producing C.  difficile detected.  Final    Comment: CRITICAL RESULT CALLED TO, READ BACK BY AND VERIFIED WITH: Athena Masse, RN @0259  09/05/16 MKELLY,MLT Performed at [facility]      Radiological Exams on Admission: Ct Abdomen Pelvis W Contrast  Result Date: 09/04/2016 CLINICAL DATA:  Celsius difficile colitis, post fecal transplant, loose stools for 6 days, lower abdominal pain, history irritable bowel syndrome EXAM: CT ABDOMEN AND PELVIS WITH CONTRAST TECHNIQUE: Multidetector CT imaging of the abdomen and pelvis was performed using the standard protocol following bolus administration of intravenous contrast. Sagittal and coronal MPR images reconstructed from axial data set. CONTRAST:  138mL ISOVUE-300 IOPAMIDOL (ISOVUE-300) INJECTION 61% IV. Dilute oral contrast. COMPARISON:  03/25/2016 FINDINGS: Lower chest: Minimal dependent atelectasis RIGHT lower lobe. Hepatobiliary: 4 mm RIGHT lobe liver cyst.  Otherwise unremarkable. Pancreas: Normal appearance Spleen: Normal appearance Adrenals/Urinary Tract: BILATERAL renal cysts. LEFT adrenal mass again identified 2.2 x 1.5 cm demonstrating significant washout on delayed images consistent with adrenal adenoma. RIGHT adrenal gland normal appearance. Ureters and bladder unremarkable. Stomach/Bowel: Appendix surgically absent by history. Mild scattered wall thickening of the colon particularly RIGHT colon and transverse colon. Stomach and remaining bowel loops unremarkable. Vascular/Lymphatic: Atherosclerotic calcification aorta without aneurysm. Few coronary arterial calcifications noted. No definite abdominal or pelvic adenopathy. Reproductive: Atrophic uterus with unremarkable adnexa Other: No free air or free fluid.  Ventral hernias containing fat. Musculoskeletal: No acute osseous findings. IMPRESSION: Hepatic and BILATERAL renal cysts. LEFT adrenal adenoma 2.2 x 1.5 cm. Mild scattered wall thickening of the colon suspicious for colitis. Aortic atherosclerosis and  minimal coronary arterial calcification. Remainder of exam unremarkable. Electronically Signed   By: Lavonia Dana M.D.   On: 09/04/2016 21:41     EKG:  Not done in ED, will get one.   Assessment/Plan Principal Problem:   Clostridium difficile diarrhea Active Problems:   Essential hypertension   Anxiety state   Depression   Hypokalemia   Diarrhea   Clostridium difficile diarrhea: This recurrent issue. Patient is s/p of fecal transplantation. Clinically patient is not septic. Lactate is normal. Hemodynamically stable. ID was consulted, Dr. Tommy Medal will see pt in AM. Pt would like to hold off Abx and wait for ID's recommendation.  -Will place on med-surg bed for obs -f/u ID consultation and recommendations -When necessary Zofran for nausea, morphine for pain -Check C. difficile PCR -IV fluid: Normal saline 1 L, then I need to cc per hour  HTN: -Continue amlodipine and Accupril  Depression and anxiety: Stable, no suicidal or homicidal ideations. -Continue home medications: Reminyl and Ativan  Hypokalemia: K= 3.4on admission. - Repleted  Left adrenal adenoma: Patient had incidental finding of  left adrenal adenoma 2.2 x 1.5 cm by CT scan. -f/u with PCP   DVT ppx: SCD Code Status: Full code Family Communication: None at bed side.  Disposition Plan:  Anticipate discharge back to previous independent living at [facility] called:  Dr. Tommy Medal Admission status: medical floor/obs    Date of Service 09/05/2016    Ivor Costa [facility] Pager [phone]  If 7PM-7AM, please contact night-coverage [email or web address] Password [facility] 09/05/2016, 4:15 AM

## 2016-09-04 NOTE — ED Notes (Signed)
Per EDP pt can take her own ativan 0.5mg 

## 2016-09-04 NOTE — ED Notes (Signed)
Paged [facility] via [facility] @ 11:06 pm

## 2016-09-04 NOTE — Telephone Encounter (Deleted)
Pt's husband called stating that wife has been on the toilet since 630 this morning. States Dr.Snider promised to call Pt back with advice on what to do over the weekend and requesting lab results Requesting call back [phone]

## 2016-09-05 DIAGNOSIS — F411 Generalized anxiety disorder: Secondary | ICD-10-CM | POA: Diagnosis present

## 2016-09-05 DIAGNOSIS — E876 Hypokalemia: Secondary | ICD-10-CM | POA: Diagnosis present

## 2016-09-05 DIAGNOSIS — Z9889 Other specified postprocedural states: Secondary | ICD-10-CM | POA: Diagnosis not present

## 2016-09-05 DIAGNOSIS — Z85828 Personal history of other malignant neoplasm of skin: Secondary | ICD-10-CM | POA: Diagnosis not present

## 2016-09-05 DIAGNOSIS — A09 Infectious gastroenteritis and colitis, unspecified: Secondary | ICD-10-CM | POA: Diagnosis present

## 2016-09-05 DIAGNOSIS — Z820 Family history of epilepsy and other diseases of the nervous system: Secondary | ICD-10-CM

## 2016-09-05 DIAGNOSIS — D3502 Benign neoplasm of left adrenal gland: Secondary | ICD-10-CM | POA: Diagnosis present

## 2016-09-05 DIAGNOSIS — A0471 Enterocolitis due to Clostridium difficile, recurrent: Secondary | ICD-10-CM | POA: Diagnosis present

## 2016-09-05 DIAGNOSIS — Z808 Family history of malignant neoplasm of other organs or systems: Secondary | ICD-10-CM

## 2016-09-05 DIAGNOSIS — A0472 Enterocolitis due to Clostridium difficile, not specified as recurrent: Secondary | ICD-10-CM | POA: Diagnosis not present

## 2016-09-05 DIAGNOSIS — F329 Major depressive disorder, single episode, unspecified: Secondary | ICD-10-CM | POA: Diagnosis present

## 2016-09-05 DIAGNOSIS — Z888 Allergy status to other drugs, medicaments and biological substances status: Secondary | ICD-10-CM

## 2016-09-05 DIAGNOSIS — Z8249 Family history of ischemic heart disease and other diseases of the circulatory system: Secondary | ICD-10-CM | POA: Diagnosis not present

## 2016-09-05 DIAGNOSIS — Z9842 Cataract extraction status, left eye: Secondary | ICD-10-CM | POA: Diagnosis not present

## 2016-09-05 DIAGNOSIS — Z9049 Acquired absence of other specified parts of digestive tract: Secondary | ICD-10-CM | POA: Diagnosis not present

## 2016-09-05 DIAGNOSIS — Z79899 Other long term (current) drug therapy: Secondary | ICD-10-CM | POA: Diagnosis not present

## 2016-09-05 DIAGNOSIS — Z881 Allergy status to other antibiotic agents status: Secondary | ICD-10-CM

## 2016-09-05 DIAGNOSIS — R197 Diarrhea, unspecified: Secondary | ICD-10-CM | POA: Diagnosis present

## 2016-09-05 DIAGNOSIS — Z9841 Cataract extraction status, right eye: Secondary | ICD-10-CM | POA: Diagnosis not present

## 2016-09-05 DIAGNOSIS — I10 Essential (primary) hypertension: Secondary | ICD-10-CM | POA: Diagnosis present

## 2016-09-05 LAB — CBC WITH DIFFERENTIAL/PLATELET
BASOS ABS: 0 10*3/uL (ref 0.0–0.1)
BASOS PCT: 0 %
EOS ABS: 0.2 10*3/uL (ref 0.0–0.7)
Eosinophils Relative: 3 %
HCT: 33.9 % — ABNORMAL LOW (ref 36.0–46.0)
HEMOGLOBIN: 11.4 g/dL — AB (ref 12.0–15.0)
Lymphocytes Relative: 32 %
Lymphs Abs: 2.3 10*3/uL (ref 0.7–4.0)
MCH: 31.4 pg (ref 26.0–34.0)
MCHC: 33.6 g/dL (ref 30.0–36.0)
MCV: 93.4 fL (ref 78.0–100.0)
Monocytes Absolute: 0.6 10*3/uL (ref 0.1–1.0)
Monocytes Relative: 8 %
NEUTROS ABS: 4.2 10*3/uL (ref 1.7–7.7)
NEUTROS PCT: 57 %
Platelets: 244 10*3/uL (ref 150–400)
RBC: 3.63 MIL/uL — AB (ref 3.87–5.11)
RDW: 12.9 % (ref 11.5–15.5)
WBC: 7.4 10*3/uL (ref 4.0–10.5)

## 2016-09-05 LAB — COMPREHENSIVE METABOLIC PANEL
ALBUMIN: 3.9 g/dL (ref 3.5–5.0)
ALK PHOS: 61 U/L (ref 38–126)
ALT: 20 U/L (ref 14–54)
ANION GAP: 7 (ref 5–15)
AST: 21 U/L (ref 15–41)
BUN: 11 mg/dL (ref 6–20)
CALCIUM: 8.3 mg/dL — AB (ref 8.9–10.3)
CO2: 24 mmol/L (ref 22–32)
Chloride: 112 mmol/L — ABNORMAL HIGH (ref 101–111)
Creatinine, Ser: 0.76 mg/dL (ref 0.44–1.00)
GFR calc Af Amer: >60 mL/min (ref >60)
GFR calc non Af Amer: >60 mL/min (ref >60)
GLUCOSE: 102 mg/dL — AB (ref 65–99)
Potassium: 3.9 mmol/L (ref 3.5–5.1)
SODIUM: 143 mmol/L (ref 135–145)
Total Bilirubin: 0.4 mg/dL (ref 0.3–1.2)
Total Protein: 6.6 g/dL (ref 6.5–8.1)

## 2016-09-05 LAB — MAGNESIUM: Magnesium: 2 mg/dL (ref 1.7–2.4)

## 2016-09-05 LAB — PHOSPHORUS: Phosphorus: 2.7 mg/dL (ref 2.5–4.6)

## 2016-09-05 LAB — C DIFFICILE QUICK SCREEN W PCR REFLEX
C Diff antigen: POSITIVE — AB
C Diff interpretation: DETECTED
C Diff toxin: POSITIVE — AB

## 2016-09-05 LAB — LACTIC ACID, PLASMA: LACTIC ACID, VENOUS: 0.9 mmol/L (ref 0.5–1.9)

## 2016-09-05 LAB — GLUCOSE, CAPILLARY: GLUCOSE-CAPILLARY: 113 mg/dL — AB (ref 65–99)

## 2016-09-05 MED ORDER — SUMATRIPTAN SUCCINATE 25 MG PO TABS
25.0000 mg | ORAL_TABLET | ORAL | Status: DC | PRN
  Administered 2016-09-06 – 2016-09-08 (×2): 25 mg via ORAL
  Filled 2016-09-05 (×3): qty 1

## 2016-09-05 MED ORDER — VANCOMYCIN 50 MG/ML ORAL SOLUTION: 125.0000 mg | ORAL | Status: DC

## 2016-09-05 MED ORDER — ORAL CARE MOUTH RINSE
15.0000 mL | Freq: Two times a day (BID) | OROMUCOSAL | Status: DC
  Administered 2016-09-05 – 2016-09-07 (×5): 15 mL via OROMUCOSAL

## 2016-09-05 MED ORDER — ADULT MULTIVITAMIN W/MINERALS CH
1.0000 | ORAL_TABLET | Freq: Every day | ORAL | Status: DC
  Administered 2016-09-05 – 2016-09-08 (×4): 1 via ORAL
  Filled 2016-09-05 (×4): qty 1

## 2016-09-05 MED ORDER — MIRTAZAPINE 15 MG PO TABS
45.0000 mg | ORAL_TABLET | Freq: Every day | ORAL | Status: DC
  Administered 2016-09-05 – 2016-09-07 (×4): 45 mg via ORAL
  Filled 2016-09-05 (×4): qty 3

## 2016-09-05 MED ORDER — LORAZEPAM 1 MG PO TABS
1.0000 mg | ORAL_TABLET | Freq: Two times a day (BID) | ORAL | Status: DC
  Administered 2016-09-05 – 2016-09-08 (×7): 1 mg via ORAL
  Filled 2016-09-05 (×7): qty 1

## 2016-09-05 MED ORDER — ACETAMINOPHEN 325 MG PO TABS
650.0000 mg | ORAL_TABLET | Freq: Four times a day (QID) | ORAL | Status: DC | PRN
  Administered 2016-09-05 – 2016-09-06 (×3): 650 mg via ORAL
  Filled 2016-09-05 (×3): qty 2

## 2016-09-05 MED ORDER — ONDANSETRON HCL 4 MG/2ML IJ SOLN: 4.0000 mg | Freq: Three times a day (TID) | INTRAMUSCULAR | Status: DC | PRN

## 2016-09-05 MED ORDER — AMLODIPINE BESYLATE 5 MG PO TABS
5.0000 mg | ORAL_TABLET | Freq: Every day | ORAL | Status: DC
  Administered 2016-09-05 – 2016-09-08 (×4): 5 mg via ORAL
  Filled 2016-09-05 (×4): qty 1

## 2016-09-05 MED ORDER — LORATADINE 10 MG PO TABS
10.0000 mg | ORAL_TABLET | Freq: Every day | ORAL | Status: DC
  Administered 2016-09-05 – 2016-09-08 (×4): 10 mg via ORAL
  Filled 2016-09-05 (×4): qty 1

## 2016-09-05 MED ORDER — CHLORHEXIDINE GLUCONATE 0.12 % MT SOLN
15.0000 mL | Freq: Two times a day (BID) | OROMUCOSAL | Status: DC
  Administered 2016-09-05 – 2016-09-08 (×7): 15 mL via OROMUCOSAL
  Filled 2016-09-05 (×6): qty 15

## 2016-09-05 MED ORDER — VANCOMYCIN 50 MG/ML ORAL SOLUTION
125.0000 mg | Freq: Two times a day (BID) | ORAL | Status: DC
  Filled 2016-09-05: qty 2.5

## 2016-09-05 MED ORDER — VANCOMYCIN 50 MG/ML ORAL SOLUTION
500.0000 mg | Freq: Four times a day (QID) | ORAL | Status: DC
  Administered 2016-09-05 – 2016-09-06 (×6): 500 mg via ORAL
  Filled 2016-09-05 (×7): qty 10

## 2016-09-05 MED ORDER — SODIUM CHLORIDE 0.9 % IV SOLN
INTRAVENOUS | Status: DC
Start: 2016-09-05 — End: 2016-09-08
  Administered 2016-09-05 – 2016-09-08 (×5): via INTRAVENOUS

## 2016-09-05 MED ORDER — CALCIUM CARBONATE-VITAMIN D 500-200 MG-UNIT PO TABS
1.0000 | ORAL_TABLET | Freq: Two times a day (BID) | ORAL | Status: DC
  Administered 2016-09-05 – 2016-09-08 (×8): 1 via ORAL
  Filled 2016-09-05 (×8): qty 1

## 2016-09-05 MED ORDER — ENOXAPARIN SODIUM 30 MG/0.3ML ~~LOC~~ SOLN
30.0000 mg | SUBCUTANEOUS | Status: DC
  Administered 2016-09-05 – 2016-09-07 (×3): 30 mg via SUBCUTANEOUS
  Filled 2016-09-05 (×4): qty 0.3

## 2016-09-05 MED ORDER — LISINOPRIL 20 MG PO TABS
40.0000 mg | ORAL_TABLET | Freq: Every day | ORAL | Status: DC
  Administered 2016-09-05 – 2016-09-08 (×4): 40 mg via ORAL
  Filled 2016-09-05 (×4): qty 2

## 2016-09-05 MED ORDER — ZOLPIDEM TARTRATE 5 MG PO TABS
5.0000 mg | ORAL_TABLET | Freq: Every evening | ORAL | Status: DC | PRN
  Administered 2016-09-05: 5 mg via ORAL
  Filled 2016-09-05 (×2): qty 1

## 2016-09-05 MED ORDER — MORPHINE SULFATE (PF) 2 MG/ML IV SOLN: 1.0000 mg | INTRAVENOUS | Status: DC | PRN

## 2016-09-05 MED ORDER — VANCOMYCIN 50 MG/ML ORAL SOLUTION: 125.0000 mg | Freq: Every day | ORAL | Status: DC

## 2016-09-05 NOTE — Progress Notes (Signed)
PROGRESS NOTE    Christina Escobar  [id number] DOB: 10-10-1929 DOA: 09/04/2016 PCP: Haywood Pao, MD   Brief Narrative:  Christina Escobar is a 82 y.o. female with medical history significant of recurrent C. difficile colitis, hypertension, Hepression, anxiety, IBS, diverticulosis, migraine headache, who presents with diarrhea and abdominal pain. Pt has hx of cdiff with multiple hospitalizations. Patient has been followed up by ID, Dr. Baxter Flattery. He had fecal transplant 2 weeks ago. She recently completed two-week course of vancomycin, stopped on 08/18/16. Pt states that she has worsening diarrhea in the past 6 days. She has 10-12 times of bowel movements with loose stool. She had one episode of diarrhea with mucus and little blood. She has mild lower abdominal pain, no nausea, vomiting. She has mild subjective fever and chills. She denies chest pain, shortness of breath, symptoms of UTI or unilateral weakness. The pt was found to have WBC 10.7, potassium 3.4, lactate 0.9, creatinine normal, temperature 99.1. CT abdomen/pelvis showed mild scattered wall thickening of the colon suspicious for colitis; hepatic and BILATERAL renal cysts; left adrenal adenoma 2.2 x 1.5 cm. Patient was worked up and placed in Observation and is awaiting to be seen by Infectious Diseases Dr. Tommy Medal.  Assessment & Plan:   Principal Problem:   Clostridium difficile diarrhea Active Problems:   Essential hypertension   Anxiety state   Depression   Hypokalemia   Diarrhea  Recurrent Clostridium difficile Colitis and Diarrhea:  -This recurrent issue. Patient is s/p of fecal transplantation.  -Clinically patient is not septic. Lactate is normal. Hemodynamically stable.  -ID was consulted, Dr. Tommy Medal and recoommends Vancomycin Taperwith 500 mg po qid for 14 days, then 125 mg po BID for 7 days, then 125 mg Daily for 7 days, then 125 mg EOD for 7 days and then 125 mg po Every 3 days for 14 days -When necessary Zofran  for nausea and vomitingIV q8hprn , Morphine 2 mg IV q4hprn for pain -C. difficile PCR Positive for Toxin and Antigen -C/w Normal Saline IVF at 100 mL/hr -NPO Except Meds and Sips -WBC went from 10.7 -> 7.4 -CT Abdomen and Pelvis showed Hepatic and BILATERAL renal cysts. LEFT adrenal adenoma 2.2 x 1.5 cm. Mild scattered wall thickening of the colon suspicious for colitis. Aortic atherosclerosis and minimal coronary arterial calcification. Remainder of exam unremarkable. -Continue to follow Infectious Disease Recommendations and appreciate Dr. Lucianne Lei Dam's Consultation and Evaluation  HTN: -C/w Amlodipine 5 mg po Daily and with Lisinopril 40 mg po Daily  Depression and Anxiety:  -Stable, no suicidal or homicidal ideations. -C/w Mirazapine 45 mg po Daily and with Lorazepam 1 mg po BID  Hypokalemia:  -Improved. K+ was now 3.9 -K+ was  3.4 on admission. -Replete as Necessary -Repeat CMP in AM  Migraine Headaches -C/w Sumatriptan 25 mg po q2hprn Migraine  Left Adrenal Adenoma:  -Patient had incidental finding of  left adrenal adenoma 2.2 x 1.5 cm by CT scan. -Not new as had mass seen on CT of Abdomen dated 01/31/2000 -F/u with PCP  DVT prophylaxis: SCD's and Lovenox Code Status: FULL CODE Family Communication: Discussed with Patient's Husband at bedside Disposition Plan: Depending on Clinical Course and Infectious Disease Evaluation  Consultants:   Infectious Diseases Dr. Tommy Medal   Procedures: None   Antimicrobials: po Vancomycin 09/05/16 -->  Subjective: Seen and examined at bedside and she was still having watery bowel movements and had at least 3 this Am. No Nausea but has mad abdominal  tenderness. No CP or SOB. Had fecal transplant 2 weeks ago. No other concerns or complaints except recurrent watery stools.   Objective: Vitals:   09/04/16 1902 09/05/16 0008 09/05/16 0125 09/05/16 0604  BP: (!) 146/51 106/68 (!) 143/57 (!) 145/62  Pulse: 88 93 80 85  Resp: 18 16 18  18   Temp:   98.2 F (36.8 C) 99.2 F (37.3 C)  TempSrc:   Oral Oral  SpO2: 99% 96% 99% 98%  Weight:      Height:        Intake/Output Summary (Last 24 hours) at 09/05/16 1304 Last data filed at 09/04/16 1956  Gross per 24 hour  Intake             1000 ml  Output                0 ml  Net             1000 ml   Filed Weights   09/04/16 1633  Weight: 46.3 kg (102 lb)    Examination: Physical Exam:  Constitutional: Thin small Elderly female in NAD and appears calm and comfortable Eyes:  Lids and conjunctivae normal, sclerae anicteric  ENMT: External Ears, Nose appear normal. Grossly normal hearing.  Neck: Appears normal, supple, no cervical masses, normal ROM, no appreciable thyromegaly, no JVD Respiratory: Clear to auscultation bilaterally, no wheezing, rales, rhonchi or crackles. Normal respiratory effort and patient is not tachypenic. No accessory muscle use.  Cardiovascular: RRR, no murmurs / rubs / gallops. S1 and S2 auscultated. No extremity edema. Abdomen: Soft, mildly tender, non-distended. No masses palpated. No appreciable hepatosplenomegaly. Bowel sounds hyperactive positive x4.  GU: Deferred. Musculoskeletal: No clubbing / cyanosis of digits/nails. No joint deformity upper and lower extremities..  Skin: No rashes, lesions, ulcers on limited skin evaluation. No induration; Warm and dry.  Neurologic: CN 2-12 grossly intact with no focal deficits. Sensation intact in all 4 Extremities. Romberg sign cerebellar reflexes not assessed.  Psychiatric: Normal judgment and insight. Alert and oriented x 3. Normal mood and appropriate affect.   Data Reviewed: I have personally reviewed following labs and imaging studies  CBC:  Recent Labs Lab 09/04/16 1900 09/05/16 0946  WBC 10.7* 7.4  NEUTROABS 7.2 4.2  HGB 12.1 11.4*  HCT 36.5 33.9*  MCV 94.3 93.4  PLT 230 [id number]   Basic Metabolic Panel:  Recent Labs Lab 09/04/16 1900 09/05/16 0946  NA 141 143  K 3.4* 3.9  CL 108  112*  CO2 25 24  GLUCOSE 114* 102*  BUN 18 11  CREATININE 0.74 0.76  CALCIUM 9.3 8.3*  MG  --  2.0  PHOS  --  2.7   GFR: Estimated Creatinine Clearance: 38.3 mL/min (by C-G formula based on SCr of 0.76 mg/dL). Liver Function Tests:  Recent Labs Lab 09/05/16 0946  AST 21  ALT 20  ALKPHOS 61  BILITOT 0.4  PROT 6.6  ALBUMIN 3.9   No results for input(s): LIPASE, AMYLASE in the last 168 hours. No results for input(s): AMMONIA in the last 168 hours. Coagulation Profile: No results for input(s): INR, PROTIME in the last 168 hours. Cardiac Enzymes: No results for input(s): CKTOTAL, CKMB, CKMBINDEX, TROPONINI in the last 168 hours. BNP (last 3 results) No results for input(s): PROBNP in the last 8760 hours. HbA1C: No results for input(s): HGBA1C in the last 72 hours. CBG:  Recent Labs Lab 09/05/16 0809  GLUCAP 113*   Lipid Profile: No results for input(s): CHOL,  HDL, LDLCALC, TRIG, CHOLHDL, LDLDIRECT in the last 72 hours. Thyroid Function Tests: No results for input(s): TSH, T4TOTAL, FREET4, T3FREE, THYROIDAB in the last 72 hours. Anemia Panel: No results for input(s): VITAMINB12, FOLATE, FERRITIN, TIBC, IRON, RETICCTPCT in the last 72 hours. Sepsis Labs:  Recent Labs Lab 09/05/16 0227  LATICACIDVEN 0.9    Recent Results (from the past 240 hour(s))  Clostridium Difficile by PCR     Status: None   Collection Time: 09/02/16 12:08 PM  Result Value Ref Range Status   Toxigenic C Difficile by pcr CANCELED Not Detected     Comment: After repeat analysis, non-amplification of the internal control suggests the presence of PCR inhibitors in the patient sample. An additional sample should be submitted for testing if clinically warranted. This test is for use only with liquid or soft stools; performance characteristics of other clinical specimen types have not been established.   This assay was performed by Cepheid GeneXpert(R) PCR. The performance characteristics of  this assay have been determined by [employer]. Performance characteristics refer to the analytical performance of the test.  Result canceled by the ancillary   C difficile quick scan w PCR reflex     Status: Abnormal   Collection Time: 09/04/16  8:50 PM  Result Value Ref Range Status   C Diff antigen POSITIVE (A) NEGATIVE Final   C Diff toxin POSITIVE (A) NEGATIVE Final   C Diff interpretation Toxin producing C. difficile detected.  Final    Comment: CRITICAL RESULT CALLED TO, READ BACK BY AND VERIFIED WITH: Athena Masse, RN @0259  09/05/16 MKELLY,MLT Performed at [facility]     Radiology Studies: Ct Abdomen Pelvis W Contrast  Result Date: 09/04/2016 CLINICAL DATA:  Celsius difficile colitis, post fecal transplant, loose stools for 6 days, lower abdominal pain, history irritable bowel syndrome EXAM: CT ABDOMEN AND PELVIS WITH CONTRAST TECHNIQUE: Multidetector CT imaging of the abdomen and pelvis was performed using the standard protocol following bolus administration of intravenous contrast. Sagittal and coronal MPR images reconstructed from axial data set. CONTRAST:  12mL ISOVUE-300 IOPAMIDOL (ISOVUE-300) INJECTION 61% IV. Dilute oral contrast. COMPARISON:  03/25/2016 FINDINGS: Lower chest: Minimal dependent atelectasis RIGHT lower lobe. Hepatobiliary: 4 mm RIGHT lobe liver cyst.  Otherwise unremarkable. Pancreas: Normal appearance Spleen: Normal appearance Adrenals/Urinary Tract: BILATERAL renal cysts. LEFT adrenal mass again identified 2.2 x 1.5 cm demonstrating significant washout on delayed images consistent with adrenal adenoma. RIGHT adrenal gland normal appearance. Ureters and bladder unremarkable. Stomach/Bowel: Appendix surgically absent by history. Mild scattered wall thickening of the colon particularly RIGHT colon and transverse colon. Stomach and remaining bowel loops unremarkable. Vascular/Lymphatic: Atherosclerotic calcification aorta without aneurysm. Few  coronary arterial calcifications noted. No definite abdominal or pelvic adenopathy. Reproductive: Atrophic uterus with unremarkable adnexa Other: No free air or free fluid.  Ventral hernias containing fat. Musculoskeletal: No acute osseous findings. IMPRESSION: Hepatic and BILATERAL renal cysts. LEFT adrenal adenoma 2.2 x 1.5 cm. Mild scattered wall thickening of the colon suspicious for colitis. Aortic atherosclerosis and minimal coronary arterial calcification. Remainder of exam unremarkable. Electronically Signed   By: Lavonia Dana M.D.   On: 09/04/2016 21:41   Scheduled Meds: . amLODipine  5 mg Oral Daily  . calcium-vitamin D  1 tablet Oral BID WC  . chlorhexidine  15 mL Mouth Rinse BID  . lisinopril  40 mg Oral Daily  . loratadine  10 mg Oral Daily  . LORazepam  1 mg Oral BID AC & HS  . mouth  rinse  15 mL Mouth Rinse q12n4p  . mirtazapine  45 mg Oral QHS  . multivitamin with minerals  1 tablet Oral Daily  . vancomycin  500 mg Oral QID   Followed by  . [START ON 09/19/2016] vancomycin  125 mg Oral BID   Followed by  . [START ON 09/27/2016] vancomycin  125 mg Oral Daily   Followed by  . [START ON 10/04/2016] vancomycin  125 mg Oral QODAY   Followed by  . [START ON 10/12/2016] vancomycin  125 mg Oral Q3 days   Continuous Infusions: . sodium chloride 100 mL/hr at 09/05/16 1257    LOS: 0 days   Kerney Elbe, DO [facility] Pager [phone]  If 7PM-7AM, please contact night-coverage [email or web address] Password [email or web address] 09/05/2016, 1:04 PM

## 2016-09-05 NOTE — Consult Note (Signed)
Date of Admission:  09/04/2016  Date of Consult:  09/05/2016  Reason for Consult: recurrent CDI after fecal transplant Referring Physician: Dr. Alfredia Ferguson   HPI: Christina Escobar is an 82 y.o. female who recently underwent fecal transplant via pills 2 roughly 2 weeks ago with vancomycin post fecal transplant Unfortunately, in the past 6 days she had worsening of diarrhea. She has 10-12 times of bowel movements with loosestool. She had one episode of diarrhea with mucus and little blood. She has mild lower abdominal pain, no nausea, vomiting. She has mild subjective fever and chills. She came to the ED last night for evaluation but initially CDI testing could not be done and ultimately I felt it better to have her admitted to facilitate accurate diagnostic testing and treatment She has since tested + for toxin and antigen. When I came to see her she already felt better vs yesterday before even starting vancomycin.   Past Medical History:  Diagnosis Date  . Allergy   . Anal fissure   . Anxiety   . Basal cell carcinoma   . Cataracts, bilateral   . Clostridium difficile diarrhea   . Depression   . Diverticulosis    hx of stricture in colon  . Hemorrhoids   . Hypertension   . IBS (irritable bowel syndrome)   . Insomnia   . Migraine   . Osteopenia   . Thrombosed hemorrhoids     Past Surgical History:  Procedure Laterality Date  . APPENDECTOMY    . BASAL CELL CARCINOMA EXCISION  [phone]  . CATARACT EXTRACTION, BILATERAL Bilateral 1999  . COLON RESECTION  2001  . INCISION AND DRAINAGE  11/05/2011   thrombosed hemorrhoid  . TRANSANAL EXCISION OF RECTAL MASS WITH HEMORRHOID INJECTION  2007   anal fissure    Social History:  reports that she has never smoked. She has never used smokeless tobacco. She reports that she drinks alcohol. She reports that she does not use drugs.   Family History  Problem Relation Age of Onset  . Alzheimer's disease Mother   . Heart failure  Mother   . Heart disease Father   . Heart failure Father   . Other Maternal Grandfather     cerebral hemorrhage  . Skin cancer Brother   . Heart disease Brother     Allergies  Allergen Reactions  . Erythromycin Nausea And Vomiting  . Flagyl [Metronidazole] Diarrhea  . Meloxicam Other (See Comments)    Reaction:  Dizziness   . Other     Broad spectrum antibiotic, does not know the name ----c diff     Medications: I have reviewed patients current medications as documented in Epic Anti-infectives    Start     Dose/Rate Route Frequency Ordered Stop   10/12/16 1000  vancomycin (VANCOCIN) 50 mg/mL oral solution 125 mg     125 mg Oral Every 3 DAYS 09/05/16 1208 10/27/16 0959   10/04/16 1000  vancomycin (VANCOCIN) 50 mg/mL oral solution 125 mg     125 mg Oral Every other day 09/05/16 1208 10/12/16 0959   09/27/16 1000  vancomycin (VANCOCIN) 50 mg/mL oral solution 125 mg     125 mg Oral Daily 09/05/16 1208 10/04/16 0959   09/19/16 2200  vancomycin (VANCOCIN) 50 mg/mL oral solution 125 mg     125 mg Oral 2 times daily 09/05/16 1208 09/26/16 2159   09/05/16 1400  vancomycin (VANCOCIN) 50 mg/mL oral solution 500 mg  500 mg Oral 4 times daily 09/05/16 1208 09/19/16 1359         ROS:as in HPI otherwise remainder of 12 point Review of Systems is negative  Blood pressure 136/60, pulse 67, temperature 98.7 F (37.1 C), temperature source Oral, resp. rate 18, height _0  (1.575 m), weight 102 lb (46.3 kg), SpO2 96 %. General: Alert and awake, oriented x3, not in any acute distress. HEENT: anicteric sclera,  EOMI, oropharynx clear and without exudate Cardiovascular: regular rate, normal r,  no murmur rubs or gallops Pulmonary: clear to auscultation bilaterally, no wheezing, rales or rhonchi Gastrointestinal: soft minimally tender to palpation, normal bowel sounds, Skin, soft tissue: no rashes Neuro: nonfocal, strength and sensation intact   Results for orders placed or performed  during the hospital encounter of 09/04/16 (from the past 48 hour(s))  Basic metabolic panel     Status: Abnormal   Collection Time: 09/04/16  7:00 PM  Result Value Ref Range   Sodium 141 135 - 145 mmol/L   Potassium 3.4 (L) 3.5 - 5.1 mmol/L   Chloride 108 101 - 111 mmol/L   CO2 25 22 - 32 mmol/L   Glucose, Bld 114 (H) 65 - 99 mg/dL   BUN 18 6 - 20 mg/dL   Creatinine, Ser 0.74 0.44 - 1.00 mg/dL   Calcium 9.3 8.9 - 10.3 mg/dL   GFR calc non Af Amer >60 >60 mL/min   GFR calc Af Amer >60 >60 mL/min    Comment: (NOTE) The eGFR has been calculated using the CKD EPI equation. This calculation has not been validated in all clinical situations. eGFR's persistently <60 mL/min signify possible Chronic Kidney Disease.    Anion gap 8 5 - 15  CBC with Differential     Status: Abnormal   Collection Time: 09/04/16  7:00 PM  Result Value Ref Range   WBC 10.7 (H) 4.0 - 10.5 K/uL   RBC 3.87 3.87 - 5.11 MIL/uL   Hemoglobin 12.1 12.0 - 15.0 g/dL   HCT 36.5 36.0 - 46.0 %   MCV 94.3 78.0 - 100.0 fL   MCH 31.3 26.0 - 34.0 pg   MCHC 33.2 30.0 - 36.0 g/dL   RDW 12.1 11.5 - 15.5 %   Platelets 230 150 - 400 K/uL   Neutrophils Relative % 67 %   Lymphocytes Relative 21 %   Monocytes Relative 10 %   Eosinophils Relative 2 %   Basophils Relative 0 %   Neutro Abs 7.2 1.7 - 7.7 K/uL   Lymphs Abs 2.2 0.7 - 4.0 K/uL   Monocytes Absolute 1.1 (H) 0.1 - 1.0 K/uL   Eosinophils Absolute 0.2 0.0 - 0.7 K/uL   Basophils Absolute 0.0 0.0 - 0.1 K/uL  C difficile quick scan w PCR reflex     Status: Abnormal   Collection Time: 09/04/16  8:50 PM  Result Value Ref Range   C Diff antigen POSITIVE (A) NEGATIVE   C Diff toxin POSITIVE (A) NEGATIVE   C Diff interpretation Toxin producing C. difficile detected.     Comment: CRITICAL RESULT CALLED TO, READ BACK BY AND VERIFIED WITH: Athena Masse, RN _1  09/05/16 MKELLY,MLT Performed at [facility]   Lactic acid, plasma     Status: None   Collection Time:  09/05/16  2:27 AM  Result Value Ref Range   Lactic Acid, Venous 0.9 0.5 - 1.9 mmol/L  Glucose, capillary     Status: Abnormal   Collection Time: 09/05/16  8:09 AM  Result Value Ref Range   Glucose-Capillary 113 (H) 65 - 99 mg/dL   Comment 1 Notify RN   CBC with Differential/Platelet     Status: Abnormal   Collection Time: 09/05/16  9:46 AM  Result Value Ref Range   WBC 7.4 4.0 - 10.5 K/uL   RBC 3.63 (L) 3.87 - 5.11 MIL/uL   Hemoglobin 11.4 (L) 12.0 - 15.0 g/dL   HCT 33.9 (L) 36.0 - 46.0 %   MCV 93.4 78.0 - 100.0 fL   MCH 31.4 26.0 - 34.0 pg   MCHC 33.6 30.0 - 36.0 g/dL   RDW 12.9 11.5 - 15.5 %   Platelets 244 150 - 400 K/uL   Neutrophils Relative % 57 %   Neutro Abs 4.2 1.7 - 7.7 K/uL   Lymphocytes Relative 32 %   Lymphs Abs 2.3 0.7 - 4.0 K/uL   Monocytes Relative 8 %   Monocytes Absolute 0.6 0.1 - 1.0 K/uL   Eosinophils Relative 3 %   Eosinophils Absolute 0.2 0.0 - 0.7 K/uL   Basophils Relative 0 %   Basophils Absolute 0.0 0.0 - 0.1 K/uL  Comprehensive metabolic panel     Status: Abnormal   Collection Time: 09/05/16  9:46 AM  Result Value Ref Range   Sodium 143 135 - 145 mmol/L   Potassium 3.9 3.5 - 5.1 mmol/L   Chloride 112 (H) 101 - 111 mmol/L   CO2 24 22 - 32 mmol/L   Glucose, Bld 102 (H) 65 - 99 mg/dL   BUN 11 6 - 20 mg/dL   Creatinine, Ser 0.76 0.44 - 1.00 mg/dL   Calcium 8.3 (L) 8.9 - 10.3 mg/dL   Total Protein 6.6 6.5 - 8.1 g/dL   Albumin 3.9 3.5 - 5.0 g/dL   AST 21 15 - 41 U/L   ALT 20 14 - 54 U/L   Alkaline Phosphatase 61 38 - 126 U/L   Total Bilirubin 0.4 0.3 - 1.2 mg/dL   GFR calc non Af Amer >60 >60 mL/min   GFR calc Af Amer >60 >60 mL/min    Comment: (NOTE) The eGFR has been calculated using the CKD EPI equation. This calculation has not been validated in all clinical situations. eGFR's persistently <60 mL/min signify possible Chronic Kidney Disease.    Anion gap 7 5 - 15  Magnesium     Status: None   Collection Time: 09/05/16  9:46 AM  Result  Value Ref Range   Magnesium 2.0 1.7 - 2.4 mg/dL  Phosphorus     Status: None   Collection Time: 09/05/16  9:46 AM  Result Value Ref Range   Phosphorus 2.7 2.5 - 4.6 mg/dL   _0 (sdes,specrequest,cult,reptstatus)   ) Recent Results (from the past 720 hour(s))  Clostridium Difficile by PCR     Status: None   Collection Time: 09/02/16 12:08 PM  Result Value Ref Range Status   Toxigenic C Difficile by pcr CANCELED Not Detected     Comment: After repeat analysis, non-amplification of the internal control suggests the presence of PCR inhibitors in the patient sample. An additional sample should be submitted for testing if clinically warranted. This test is for use only with liquid or soft stools; performance characteristics of other clinical specimen types have not been established.   This assay was performed by Cepheid GeneXpert(R) PCR. The performance characteristics of this assay have been determined by [employer]. Performance characteristics refer to the analytical performance of the test.  Result canceled by the ancillary  C difficile quick scan w PCR reflex     Status: Abnormal   Collection Time: 09/04/16  8:50 PM  Result Value Ref Range Status   C Diff antigen POSITIVE (A) NEGATIVE Final   C Diff toxin POSITIVE (A) NEGATIVE Final   C Diff interpretation Toxin producing C. difficile detected.  Final    Comment: CRITICAL RESULT CALLED TO, READ BACK BY AND VERIFIED WITH: Athena Masse, RN _0  09/05/16 MKELLY,MLT Performed at [facility]      Impression/Recommendation  Principal Problem:   Clostridium difficile diarrhea Active Problems:   Essential hypertension   Anxiety state   Depression   Hypokalemia   Diarrhea   ZANYIA SILBAUGH is a 82 y.o. 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   #1 Recurrent CDI:   Will start vancomycin pulse and taper. Dr. Baxter Flattery had texted me re another patient and I asked her  thoughts re the diet and she herself is not insistent on lactose free diet. She believes this was a suggestion of another MD.  Avoid unnecessary abx, ppi, pepcid. Perhaps consider another means of fecal transplant in future such as enemas, NG tube. She did not want to do colonoscopy procedure.  I will check in on her again tomorrow and then Dr. Johnnye Sima will be available for questions for first few days of the holidays.     09/05/2016, 5:00 PM   Thank you so much for this interesting consult  [place] [facility] [place] [phone] (pager) [phone] (office) 09/05/2016, 5:00 PM  Rhina Brackett Dam 09/05/2016, 5:00 PM

## 2016-09-06 DIAGNOSIS — R197 Diarrhea, unspecified: Secondary | ICD-10-CM

## 2016-09-06 LAB — COMPREHENSIVE METABOLIC PANEL
ALBUMIN: 3.7 g/dL (ref 3.5–5.0)
ALK PHOS: 58 U/L (ref 38–126)
ALT: 24 U/L (ref 14–54)
ANION GAP: 10 (ref 5–15)
AST: 24 U/L (ref 15–41)
BILIRUBIN TOTAL: 0.7 mg/dL (ref 0.3–1.2)
BUN: 9 mg/dL (ref 6–20)
CO2: 21 mmol/L — AB (ref 22–32)
Calcium: 8 mg/dL — ABNORMAL LOW (ref 8.9–10.3)
Chloride: 113 mmol/L — ABNORMAL HIGH (ref 101–111)
Creatinine, Ser: 0.75 mg/dL (ref 0.44–1.00)
GFR calc non Af Amer: >60 mL/min (ref >60)
GLUCOSE: 75 mg/dL (ref 65–99)
POTASSIUM: 3.8 mmol/L (ref 3.5–5.1)
SODIUM: 144 mmol/L (ref 135–145)
TOTAL PROTEIN: 6.3 g/dL — AB (ref 6.5–8.1)

## 2016-09-06 LAB — CBC WITH DIFFERENTIAL/PLATELET
Basophils Absolute: 0 10*3/uL (ref 0.0–0.1)
Basophils Relative: 1 %
EOS ABS: 0.3 10*3/uL (ref 0.0–0.7)
Eosinophils Relative: 4 %
HCT: 34.9 % — ABNORMAL LOW (ref 36.0–46.0)
HEMOGLOBIN: 11.5 g/dL — AB (ref 12.0–15.0)
LYMPHS ABS: 2.8 10*3/uL (ref 0.7–4.0)
Lymphocytes Relative: 42 %
MCH: 30.7 pg (ref 26.0–34.0)
MCHC: 33 g/dL (ref 30.0–36.0)
MCV: 93.3 fL (ref 78.0–100.0)
MONO ABS: 0.7 10*3/uL (ref 0.1–1.0)
MONOS PCT: 11 %
Neutro Abs: 2.9 10*3/uL (ref 1.7–7.7)
Neutrophils Relative %: 42 %
Platelets: 239 10*3/uL (ref 150–400)
RBC: 3.74 MIL/uL — ABNORMAL LOW (ref 3.87–5.11)
RDW: 12.6 % (ref 11.5–15.5)
WBC: 6.6 10*3/uL (ref 4.0–10.5)

## 2016-09-06 LAB — GLUCOSE, CAPILLARY
GLUCOSE-CAPILLARY: 64 mg/dL — AB (ref 65–99)
Glucose-Capillary: 118 mg/dL — ABNORMAL HIGH (ref 65–99)

## 2016-09-06 LAB — MAGNESIUM: Magnesium: 2 mg/dL (ref 1.7–2.4)

## 2016-09-06 LAB — PHOSPHORUS: PHOSPHORUS: 2.3 mg/dL — AB (ref 2.5–4.6)

## 2016-09-06 LAB — STOOL CULTURE

## 2016-09-06 MED ORDER — VANCOMYCIN 50 MG/ML ORAL SOLUTION: 125.0000 mg | ORAL | Status: DC

## 2016-09-06 MED ORDER — DEXTROSE 50 % IV SOLN
INTRAVENOUS | Status: AC
  Administered 2016-09-06: 25 mL
  Filled 2016-09-06: qty 50

## 2016-09-06 MED ORDER — VANCOMYCIN 50 MG/ML ORAL SOLUTION: 125.0000 mg | Freq: Every day | ORAL | Status: DC

## 2016-09-06 MED ORDER — VANCOMYCIN 50 MG/ML ORAL SOLUTION: 125.0000 mg | Freq: Two times a day (BID) | ORAL | Status: DC

## 2016-09-06 MED ORDER — VANCOMYCIN 50 MG/ML ORAL SOLUTION
125.0000 mg | Freq: Four times a day (QID) | ORAL | Status: DC
  Administered 2016-09-06 – 2016-09-08 (×7): 125 mg via ORAL
  Filled 2016-09-06 (×8): qty 2.5

## 2016-09-06 MED ORDER — LORAZEPAM 0.5 MG PO TABS
0.5000 mg | ORAL_TABLET | Freq: Every evening | ORAL | Status: DC
  Administered 2016-09-06 – 2016-09-07 (×2): 0.5 mg via ORAL
  Filled 2016-09-06 (×2): qty 1

## 2016-09-06 NOTE — Progress Notes (Signed)
Subjective:  Several bm last night, nearly hourly none this am she also disliked the volume of the higher dose of oral vancomycin I had rx for her and she wants to go to 125mg  qid   Antibiotics:  Anti-infectives    Start     Dose/Rate Route Frequency Ordered Stop   10/12/16 1000  vancomycin (VANCOCIN) 50 mg/mL oral solution 125 mg  Status:  Discontinued     125 mg Oral Every 3 DAYS 09/05/16 1208 09/06/16 1813   10/12/16 1000  vancomycin (VANCOCIN) 50 mg/mL oral solution 125 mg     125 mg Oral Every 3 DAYS 09/06/16 1813 10/27/16 0959   10/04/16 1000  vancomycin (VANCOCIN) 50 mg/mL oral solution 125 mg  Status:  Discontinued     125 mg Oral Every other day 09/05/16 1208 09/06/16 1813   10/04/16 1000  vancomycin (VANCOCIN) 50 mg/mL oral solution 125 mg     125 mg Oral Every other day 09/06/16 1813 10/12/16 0959   09/27/16 1000  vancomycin (VANCOCIN) 50 mg/mL oral solution 125 mg  Status:  Discontinued     125 mg Oral Daily 09/05/16 1208 09/06/16 1813   09/27/16 1000  vancomycin (VANCOCIN) 50 mg/mL oral solution 125 mg     125 mg Oral Daily 09/06/16 1813 10/04/16 0959   09/19/16 2200  vancomycin (VANCOCIN) 50 mg/mL oral solution 125 mg  Status:  Discontinued     125 mg Oral 2 times daily 09/05/16 1208 09/06/16 1813   09/19/16 2200  vancomycin (VANCOCIN) 50 mg/mL oral solution 125 mg     125 mg Oral 2 times daily 09/06/16 1813 09/26/16 2159   09/06/16 2200  vancomycin (VANCOCIN) 50 mg/mL oral solution 125 mg     125 mg Oral 4 times daily 09/06/16 1813 09/19/16 1359   09/05/16 1400  vancomycin (VANCOCIN) 50 mg/mL oral solution 500 mg  Status:  Discontinued     500 mg Oral 4 times daily 09/05/16 1208 09/06/16 1813      Medications: Scheduled Meds: . amLODipine  5 mg Oral Daily  . calcium-vitamin D  1 tablet Oral BID WC  . chlorhexidine  15 mL Mouth Rinse BID  . enoxaparin (LOVENOX) injection  30 mg Subcutaneous Q24H  . lisinopril  40 mg Oral Daily  . loratadine  10 mg  Oral Daily  . LORazepam  0.5 mg Oral QPM  . LORazepam  1 mg Oral BID AC & HS  . mouth rinse  15 mL Mouth Rinse q12n4p  . mirtazapine  45 mg Oral QHS  . multivitamin with minerals  1 tablet Oral Daily  . vancomycin  125 mg Oral QID   Followed by  . [START ON 09/19/2016] vancomycin  125 mg Oral BID   Followed by  . [START ON 09/27/2016] vancomycin  125 mg Oral Daily   Followed by  . [START ON 10/04/2016] vancomycin  125 mg Oral QODAY   Followed by  . [START ON 10/12/2016] vancomycin  125 mg Oral Q3 days   Continuous Infusions: . sodium chloride 100 mL/hr at 09/06/16 0927   PRN Meds:.acetaminophen, morphine injection, ondansetron (ZOFRAN) IV, SUMAtriptan, zolpidem    Objective: Weight change:   Intake/Output Summary (Last 24 hours) at 09/06/16 1814 Last data filed at 09/06/16 [id number]  Gross per 24 hour  Intake             1100 ml  Output  0 ml  Net             1100 ml   Blood pressure (!) 132/49, pulse 74, temperature 98.2 F (36.8 C), temperature source Oral, resp. rate 18, height 5\' 2"  (1.575 m), weight 102 lb (46.3 kg), SpO2 99 %. Temp:  [98.2 F (36.8 C)-99.1 F (37.3 C)] 98.2 F (36.8 C) (12/24 1424) Pulse Rate:  [74-103] 74 (12/24 1424) Resp:  [18] 18 (12/24 1424) BP: (127-147)/(49-54) 132/49 (12/24 1424) SpO2:  [98 %-99 %] 99 % (12/24 1424)  Physical Exam: General: Alert and awake, oriented x3, not in any acute distress, reading book HEENT: anicteric sclera, , EOMI CVS regular rate, normal r,  no murmur rubs or gallops Chest: clear to auscultation bilaterally, no wheezing, rales or rhonchi Abdomen: soft nontender normal bowel sounds, protruberant abdomen Extremities: no  clubbing or edema noted bilaterally Skin: no rashes Neuro: nonfocal  CBC:  CBC Latest Ref Rng & Units 09/06/2016 09/05/2016 09/04/2016  WBC 4.0 - 10.5 K/uL 6.6 7.4 10.7(H)  Hemoglobin 12.0 - 15.0 g/dL 11.5(L) 11.4(L) 12.1  Hematocrit 36.0 - 46.0 % 34.9(L) 33.9(L) 36.5  Platelets  150 - 400 K/uL 239 244 230      BMET  Recent Labs  09/05/16 0946 09/06/16 0510  NA 143 144  K 3.9 3.8  CL 112* 113*  CO2 24 21*  GLUCOSE 102* 75  BUN 11 9  CREATININE 0.76 0.75  CALCIUM 8.3* 8.0*     Liver Panel   Recent Labs  09/05/16 0946 09/06/16 0510  PROT 6.6 6.3*  ALBUMIN 3.9 3.7  AST 21 24  ALT 20 24  ALKPHOS 61 58  BILITOT 0.4 0.7       Sedimentation Rate No results for input(s): ESRSEDRATE in the last 72 hours. C-Reactive Protein No results for input(s): CRP in the last 72 hours.  Micro Results: Recent Results (from the past 720 hour(s))  Stool Culture     Status: None   Collection Time: 09/02/16 12:08 PM  Result Value Ref Range Status   Organism ID, Bacteria   Final    No Salmonella,Shigella,Campylobacter,Yersinia,or No E.coli 0157:H7 isolated.   Clostridium Difficile by PCR     Status: None   Collection Time: 09/02/16 12:08 PM  Result Value Ref Range Status   Toxigenic C Difficile by pcr CANCELED Not Detected     Comment: After repeat analysis, non-amplification of the internal control suggests the presence of PCR inhibitors in the patient sample. An additional sample should be submitted for testing if clinically warranted. This test is for use only with liquid or soft stools; performance characteristics of other clinical specimen types have not been established.   This assay was performed by Cepheid GeneXpert(R) PCR. The performance characteristics of this assay have been determined by [employer]. Performance characteristics refer to the analytical performance of the test.  Result canceled by the ancillary   C difficile quick scan w PCR reflex     Status: Abnormal   Collection Time: 09/04/16  8:50 PM  Result Value Ref Range Status   C Diff antigen POSITIVE (A) NEGATIVE Final   C Diff toxin POSITIVE (A) NEGATIVE Final   C Diff interpretation Toxin producing C. difficile detected.  Final    Comment: CRITICAL RESULT  CALLED TO, READ BACK BY AND VERIFIED WITH: Athena Masse, RN @0259  09/05/16 MKELLY,MLT Performed at [facility]     Studies/Results: Ct Abdomen Pelvis W Contrast  Result Date: 09/04/2016 CLINICAL DATA:  Celsius difficile colitis, post fecal transplant, loose stools for 6 days, lower abdominal pain, history irritable bowel syndrome EXAM: CT ABDOMEN AND PELVIS WITH CONTRAST TECHNIQUE: Multidetector CT imaging of the abdomen and pelvis was performed using the standard protocol following bolus administration of intravenous contrast. Sagittal and coronal MPR images reconstructed from axial data set. CONTRAST:  182mL ISOVUE-300 IOPAMIDOL (ISOVUE-300) INJECTION 61% IV. Dilute oral contrast. COMPARISON:  03/25/2016 FINDINGS: Lower chest: Minimal dependent atelectasis RIGHT lower lobe. Hepatobiliary: 4 mm RIGHT lobe liver cyst.  Otherwise unremarkable. Pancreas: Normal appearance Spleen: Normal appearance Adrenals/Urinary Tract: BILATERAL renal cysts. LEFT adrenal mass again identified 2.2 x 1.5 cm demonstrating significant washout on delayed images consistent with adrenal adenoma. RIGHT adrenal gland normal appearance. Ureters and bladder unremarkable. Stomach/Bowel: Appendix surgically absent by history. Mild scattered wall thickening of the colon particularly RIGHT colon and transverse colon. Stomach and remaining bowel loops unremarkable. Vascular/Lymphatic: Atherosclerotic calcification aorta without aneurysm. Few coronary arterial calcifications noted. No definite abdominal or pelvic adenopathy. Reproductive: Atrophic uterus with unremarkable adnexa Other: No free air or free fluid.  Ventral hernias containing fat. Musculoskeletal: No acute osseous findings. IMPRESSION: Hepatic and BILATERAL renal cysts. LEFT adrenal adenoma 2.2 x 1.5 cm. Mild scattered wall thickening of the colon suspicious for colitis. Aortic atherosclerosis and minimal coronary arterial calcification. Remainder of exam unremarkable.  Electronically Signed   By: Lavonia Dana M.D.   On: 09/04/2016 21:41      Assessment/Plan:  INTERVAL HISTORY: patient improved this am   Principal Problem:   Clostridium difficile diarrhea Active Problems:   Essential hypertension   Anxiety state   Depression   Hypokalemia   Diarrhea    DALEEN LOHRKE is a 82 y.o. 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   Dr. Johnnye Sima is covering from December 25th thru 28th and Dr. Baxter Flattery from 29th through January 1st.   They are available for any questions in the meantime  The patient should likely be ready for DC in next day or 2 I would anticipate.   LOS: 1 day   Alcide Evener 09/06/2016, 6:14 PM

## 2016-09-06 NOTE — Progress Notes (Signed)
PROGRESS NOTE    Christina Escobar  [id number] DOB: 1930/02/17 DOA: 09/04/2016 PCP: Haywood Pao, MD  Brief Narrative:  Christina Escobar is a 82 y.o. female with medical history significant of recurrent C. difficile colitis, hypertension, Hepression, anxiety, IBS, diverticulosis, migraine headache, who presents with diarrhea and abdominal pain. Pt has hx of cdiff with multiple hospitalizations. Patient has been followed up by ID, Dr. Baxter Flattery. He had fecal transplant 2 weeks ago. She recently completed two-week course of vancomycin, stopped on 08/18/16. Pt states that she has worsening diarrhea in the past 6 days. She has 10-12 times of bowel movements with loose stool. She had one episode of diarrhea with mucus and little blood. She has mild lower abdominal pain, no nausea, vomiting. She has mild subjective fever and chills. She denies chest pain, shortness of breath, symptoms of UTI or unilateral weakness. The pt was found to have WBC 10.7, potassium 3.4, lactate 0.9, creatinine normal, temperature 99.1. CT abdomen/pelvis showed mild scattered wall thickening of the colon suspicious for colitis; hepatic and BILATERAL renal cysts; left adrenal adenoma 2.2 x 1.5 cm. Patient was worked up for recurrent C.Difficile and seen by Infectious Diseases Dr. Tommy Medal who recommended  Restarting Vancomycin Taper.  Assessment & Plan:   Principal Problem:   Clostridium difficile diarrhea Active Problems:   Essential hypertension   Anxiety state   Depression   Hypokalemia   Diarrhea  Recurrent Clostridium difficile Colitis and Diarrhea:  -This recurrent issue. Patient is s/p of fecal transplantation.  -Clinically patient is not septic. Lactate is normal. Hemodynamically stable.  -ID was consulted, Dr. Tommy Medal and recoommends Vancomycin Taperwith 500 mg po qid for 14 days, then 125 mg po BID for 7 days, then 125 mg Daily for 7 days, then 125 mg EOD for 7 days and then 125 mg po Every 3 days for 14  days -When necessary Zofran for nausea and vomitingIV q8hprn , Morphine 2 mg IV q4hprn for pain -C. difficile PCR Positive for Toxin and Antigen -C/w Normal Saline IVF at 100 mL/hr -Started Clear Liquid Diet  -WBC went from 10.7 -> 7.4 -> 6.6 -CT Abdomen and Pelvis showed Hepatic and BILATERAL renal cysts. LEFT adrenal adenoma 2.2 x 1.5 cm. Mild scattered wall thickening of the colon suspicious for colitis. Aortic atherosclerosis and minimal coronary arterial calcification. Remainder of exam unremarkable. -Continue to follow Infectious Disease Recommendations and appreciate Dr. Lucianne Lei Dam's Consultation and Evaluation -May need another means of fecal transplant in the future such as enemas/NG tube.  -* Had a lengthy discussion with the patient about vancomycin taper as she was concerned and did not want to start it. Discussed with her it was recommeneded by Id. Patient was adamantly refusing care and then changed her mind and agreed to start taper. Needs re-assurance from Infectious Diseases that this was their recommendations.  -Avoid unnecessary Abx, PPI, and Pepcid.   HTN: -C/w Amlodipine 5 mg po Daily and with Lisinopril 40 mg po Daily  Depression and Anxiety:  -Stable, no suicidal or homicidal ideations. -C/w Mirazapine 45 mg po Daily and with Lorazepam 1 mg po BID -Restarted 0.5 mg po Lorazepam at 1700 daily  Hypokalemia:  -Improved. K+ was now 3.8 -K+ was  3.4 on admission. -Replete as Necessary -Repeat CMP in AM  Migraine Headaches -C/w Sumatriptan 25 mg po q2hprn Migraine  Left Adrenal Adenoma:  -Patient had incidental finding of  left adrenal adenoma 2.2 x 1.5 cm by CT scan. -Not new as  had mass seen on CT of Abdomen dated 01/31/2000 -F/u with PCP  DVT prophylaxis: SCD's and Lovenox Code Status: FULL CODE Family Communication: Discussed with Patient's Husband at bedside Disposition Plan: Depending on Clinical Course and Infectious Disease Evaluation  Consultants:     Infectious Diseases Dr. Tommy Medal   Procedures: None   Antimicrobials: po Vancomycin 09/05/16 -->  Subjective: Seen and examined at bedside and she refused the po Vancomycin this AM because of concerns. Still having watery diarrhea. After lengthy discussion agreed to start Vancomycin taper but did so with extreme hesitation. No N/V and Abdominal pain slightly better. Started on Clear Liquid diet.   Objective: Vitals:   09/05/16 1500 09/05/16 2054 09/06/16 0601 09/06/16 1424  BP: 136/60 (!) 147/53 (!) 127/54 (!) 132/49  Pulse: 67 80 (!) 103 74  Resp: 18 18 18 18   Temp: 98.7 F (37.1 C) 99.1 F (37.3 C) 98.5 F (36.9 C) 98.2 F (36.8 C)  TempSrc: Oral Oral Oral Oral  SpO2: 96% 98% 99% 99%  Weight:      Height:        Intake/Output Summary (Last 24 hours) at 09/06/16 1716 Last data filed at 09/06/16 [id number]  Gross per 24 hour  Intake             1100 ml  Output                0 ml  Net             1100 ml   Filed Weights   09/04/16 1633  Weight: 46.3 kg (102 lb)    Examination: Physical Exam:  Constitutional: Thin small Elderly female in NAD and appears calm and comfortable; intially refusing care but agreed after lengthy discussion Eyes:  Lids and conjunctivae normal, sclerae anicteric  ENMT: External Ears, Nose appear normal. Grossly normal hearing.  Neck: Appears normal, supple, no cervical masses, normal ROM, no appreciable thyromegaly, no JVD Respiratory: Clear to auscultation bilaterally, no wheezing, rales, rhonchi or crackles. Normal respiratory effort and patient is not tachypenic. No accessory muscle use.  Cardiovascular: RRR, no murmurs / rubs / gallops. S1 and S2 auscultated. No extremity edema. Abdomen: Soft, mildly tender to palpation, non-distended. No masses palpated. No appreciable hepatosplenomegaly. Bowel sounds hyperactive positive x4.  GU: Deferred. Musculoskeletal: No clubbing / cyanosis of digits/nails. No joint deformity upper and lower  extremities..  Skin: No rashes, lesions, ulcers on limited skin evaluation. No induration; Warm and dry.  Neurologic: CN 2-12 grossly intact with no focal deficits. Sensation intact in all 4 Extremities. Romberg sign cerebellar reflexes not assessed.  Psychiatric: Normal judgment and insight. Alert and oriented x 3. Anxious mood and appropriate affect.   Data Reviewed: I have personally reviewed following labs and imaging studies  CBC:  Recent Labs Lab 09/04/16 1900 09/05/16 0946 09/06/16 0510  WBC 10.7* 7.4 6.6  NEUTROABS 7.2 4.2 2.9  HGB 12.1 11.4* 11.5*  HCT 36.5 33.9* 34.9*  MCV 94.3 93.4 93.3  PLT 230 244 [id number]   Basic Metabolic Panel:  Recent Labs Lab 09/04/16 1900 09/05/16 0946 09/06/16 0510  NA 141 143 144  K 3.4* 3.9 3.8  CL 108 112* 113*  CO2 25 24 21*  GLUCOSE 114* 102* 75  BUN 18 11 9   CREATININE 0.74 0.76 0.75  CALCIUM 9.3 8.3* 8.0*  MG  --  2.0 2.0  PHOS  --  2.7 2.3*   GFR: Estimated Creatinine Clearance: 38.3 mL/min (by C-G formula based on  SCr of 0.75 mg/dL). Liver Function Tests:  Recent Labs Lab 09/05/16 0946 09/06/16 0510  AST 21 24  ALT 20 24  ALKPHOS 61 58  BILITOT 0.4 0.7  PROT 6.6 6.3*  ALBUMIN 3.9 3.7   No results for input(s): LIPASE, AMYLASE in the last 168 hours. No results for input(s): AMMONIA in the last 168 hours. Coagulation Profile: No results for input(s): INR, PROTIME in the last 168 hours. Cardiac Enzymes: No results for input(s): CKTOTAL, CKMB, CKMBINDEX, TROPONINI in the last 168 hours. BNP (last 3 results) No results for input(s): PROBNP in the last 8760 hours. HbA1C: No results for input(s): HGBA1C in the last 72 hours. CBG:  Recent Labs Lab 09/05/16 0809 09/06/16 0731 09/06/16 0838  GLUCAP 113* 64* 118*   Lipid Profile: No results for input(s): CHOL, HDL, LDLCALC, TRIG, CHOLHDL, LDLDIRECT in the last 72 hours. Thyroid Function Tests: No results for input(s): TSH, T4TOTAL, FREET4, T3FREE, THYROIDAB in  the last 72 hours. Anemia Panel: No results for input(s): VITAMINB12, FOLATE, FERRITIN, TIBC, IRON, RETICCTPCT in the last 72 hours. Sepsis Labs:  Recent Labs Lab 09/05/16 0227  LATICACIDVEN 0.9    Recent Results (from the past 240 hour(s))  Stool Culture     Status: None   Collection Time: 09/02/16 12:08 PM  Result Value Ref Range Status   Organism ID, Bacteria   Final    No Salmonella,Shigella,Campylobacter,Yersinia,or No E.coli 0157:H7 isolated.   Clostridium Difficile by PCR     Status: None   Collection Time: 09/02/16 12:08 PM  Result Value Ref Range Status   Toxigenic C Difficile by pcr CANCELED Not Detected     Comment: After repeat analysis, non-amplification of the internal control suggests the presence of PCR inhibitors in the patient sample. An additional sample should be submitted for testing if clinically warranted. This test is for use only with liquid or soft stools; performance characteristics of other clinical specimen types have not been established.   This assay was performed by Cepheid GeneXpert(R) PCR. The performance characteristics of this assay have been determined by [employer]. Performance characteristics refer to the analytical performance of the test.  Result canceled by the ancillary   C difficile quick scan w PCR reflex     Status: Abnormal   Collection Time: 09/04/16  8:50 PM  Result Value Ref Range Status   C Diff antigen POSITIVE (A) NEGATIVE Final   C Diff toxin POSITIVE (A) NEGATIVE Final   C Diff interpretation Toxin producing C. difficile detected.  Final    Comment: CRITICAL RESULT CALLED TO, READ BACK BY AND VERIFIED WITH: Athena Masse, RN @0259  09/05/16 MKELLY,MLT Performed at [facility]     Radiology Studies: Ct Abdomen Pelvis W Contrast  Result Date: 09/04/2016 CLINICAL DATA:  Celsius difficile colitis, post fecal transplant, loose stools for 6 days, lower abdominal pain, history irritable bowel syndrome  EXAM: CT ABDOMEN AND PELVIS WITH CONTRAST TECHNIQUE: Multidetector CT imaging of the abdomen and pelvis was performed using the standard protocol following bolus administration of intravenous contrast. Sagittal and coronal MPR images reconstructed from axial data set. CONTRAST:  186mL ISOVUE-300 IOPAMIDOL (ISOVUE-300) INJECTION 61% IV. Dilute oral contrast. COMPARISON:  03/25/2016 FINDINGS: Lower chest: Minimal dependent atelectasis RIGHT lower lobe. Hepatobiliary: 4 mm RIGHT lobe liver cyst.  Otherwise unremarkable. Pancreas: Normal appearance Spleen: Normal appearance Adrenals/Urinary Tract: BILATERAL renal cysts. LEFT adrenal mass again identified 2.2 x 1.5 cm demonstrating significant washout on delayed images consistent with adrenal adenoma. RIGHT adrenal  gland normal appearance. Ureters and bladder unremarkable. Stomach/Bowel: Appendix surgically absent by history. Mild scattered wall thickening of the colon particularly RIGHT colon and transverse colon. Stomach and remaining bowel loops unremarkable. Vascular/Lymphatic: Atherosclerotic calcification aorta without aneurysm. Few coronary arterial calcifications noted. No definite abdominal or pelvic adenopathy. Reproductive: Atrophic uterus with unremarkable adnexa Other: No free air or free fluid.  Ventral hernias containing fat. Musculoskeletal: No acute osseous findings. IMPRESSION: Hepatic and BILATERAL renal cysts. LEFT adrenal adenoma 2.2 x 1.5 cm. Mild scattered wall thickening of the colon suspicious for colitis. Aortic atherosclerosis and minimal coronary arterial calcification. Remainder of exam unremarkable. Electronically Signed   By: Lavonia Dana M.D.   On: 09/04/2016 21:41   Scheduled Meds: . amLODipine  5 mg Oral Daily  . calcium-vitamin D  1 tablet Oral BID WC  . chlorhexidine  15 mL Mouth Rinse BID  . enoxaparin (LOVENOX) injection  30 mg Subcutaneous Q24H  . lisinopril  40 mg Oral Daily  . loratadine  10 mg Oral Daily  . LORazepam  0.5  mg Oral QPM  . LORazepam  1 mg Oral BID AC & HS  . mouth rinse  15 mL Mouth Rinse q12n4p  . mirtazapine  45 mg Oral QHS  . multivitamin with minerals  1 tablet Oral Daily  . vancomycin  500 mg Oral QID   Followed by  . [START ON 09/19/2016] vancomycin  125 mg Oral BID   Followed by  . [START ON 09/27/2016] vancomycin  125 mg Oral Daily   Followed by  . [START ON 10/04/2016] vancomycin  125 mg Oral QODAY   Followed by  . [START ON 10/12/2016] vancomycin  125 mg Oral Q3 days   Continuous Infusions: . sodium chloride 100 mL/hr at 09/06/16 0927    LOS: 1 day   Kerney Elbe, DO [facility] Pager [phone]  If 7PM-7AM, please contact night-coverage [email or web address] Password [facility] 09/06/2016, 5:16 PM

## 2016-09-07 DIAGNOSIS — F329 Major depressive disorder, single episode, unspecified: Secondary | ICD-10-CM

## 2016-09-07 LAB — CBC WITH DIFFERENTIAL/PLATELET
BASOS ABS: 0 10*3/uL (ref 0.0–0.1)
BASOS PCT: 1 %
EOS ABS: 0.3 10*3/uL (ref 0.0–0.7)
Eosinophils Relative: 4 %
HCT: 34 % — ABNORMAL LOW (ref 36.0–46.0)
HEMOGLOBIN: 11.5 g/dL — AB (ref 12.0–15.0)
Lymphocytes Relative: 39 %
Lymphs Abs: 2.6 10*3/uL (ref 0.7–4.0)
MCH: 31.2 pg (ref 26.0–34.0)
MCHC: 33.8 g/dL (ref 30.0–36.0)
MCV: 92.1 fL (ref 78.0–100.0)
Monocytes Absolute: 0.7 10*3/uL (ref 0.1–1.0)
Monocytes Relative: 11 %
NEUTROS ABS: 3.2 10*3/uL (ref 1.7–7.7)
NEUTROS PCT: 45 %
Platelets: 253 10*3/uL (ref 150–400)
RBC: 3.69 MIL/uL — AB (ref 3.87–5.11)
RDW: 12.5 % (ref 11.5–15.5)
WBC: 6.8 10*3/uL (ref 4.0–10.5)

## 2016-09-07 LAB — COMPREHENSIVE METABOLIC PANEL
ALBUMIN: 3.5 g/dL (ref 3.5–5.0)
ALK PHOS: 51 U/L (ref 38–126)
ALT: 25 U/L (ref 14–54)
AST: 28 U/L (ref 15–41)
Anion gap: 7 (ref 5–15)
CALCIUM: 7.8 mg/dL — AB (ref 8.9–10.3)
CO2: 23 mmol/L (ref 22–32)
Chloride: 113 mmol/L — ABNORMAL HIGH (ref 101–111)
Creatinine, Ser: 0.68 mg/dL (ref 0.44–1.00)
GFR calc Af Amer: >60 mL/min (ref >60)
GFR calc non Af Amer: >60 mL/min (ref >60)
GLUCOSE: 86 mg/dL (ref 65–99)
Potassium: 3.8 mmol/L (ref 3.5–5.1)
SODIUM: 143 mmol/L (ref 135–145)
Total Bilirubin: 0.4 mg/dL (ref 0.3–1.2)
Total Protein: 6.3 g/dL — ABNORMAL LOW (ref 6.5–8.1)

## 2016-09-07 LAB — GLUCOSE, CAPILLARY: GLUCOSE-CAPILLARY: 137 mg/dL — AB (ref 65–99)

## 2016-09-07 LAB — MAGNESIUM: Magnesium: 1.9 mg/dL (ref 1.7–2.4)

## 2016-09-07 LAB — PHOSPHORUS: Phosphorus: 1.6 mg/dL — ABNORMAL LOW (ref 2.5–4.6)

## 2016-09-07 NOTE — Progress Notes (Signed)
PROGRESS NOTE    [name]  [id number] DOB: August 14, 1930 DOA: 09/04/2016 PCP: Haywood Pao, MD  Brief Narrative:  [name] is a 82 y.o. female with medical history significant of recurrent C. difficile colitis, hypertension, Hepression, anxiety, IBS, diverticulosis, migraine headache, who presents with diarrhea and abdominal pain. Pt has hx of cdiff with multiple hospitalizations. Patient has been followed up by ID, Dr. Baxter Flattery. He had fecal transplant 2 weeks ago. She recently completed two-week course of vancomycin, stopped on 08/18/16. Pt states that she has worsening diarrhea in the past 6 days. She has 10-12 times of bowel movements with loose stool. She had one episode of diarrhea with mucus and little blood. She has mild lower abdominal pain, no nausea, vomiting. She has mild subjective fever and chills. She denies chest pain, shortness of breath, symptoms of UTI or unilateral weakness. The pt was found to have WBC 10.7, potassium 3.4, lactate 0.9, creatinine normal, temperature 99.1. CT abdomen/pelvis showed mild scattered wall thickening of the colon suspicious for colitis; hepatic and BILATERAL renal cysts; left adrenal adenoma 2.2 x 1.5 cm. Patient was worked up for recurrent C.Difficile and seen by Infectious Diseases Dr. Tommy Medal who recommended Restarting Vancomycin Taper.  Assessment & Plan:   Principal Problem:   Clostridium difficile diarrhea Active Problems:   Essential hypertension   Anxiety state   Depression   Hypokalemia   Diarrhea   Diarrhea of presumed infectious origin  Recurrent Clostridium difficile Colitis and Diarrhea:  -This recurrent issue. Patient is s/p of fecal transplantation.  -Clinically patient is not septic. Lactate is normal. Hemodynamically stable.  -ID was consulted, Dr. Tommy Medal and recommends Vancomycin Taper.Vancomyin Pulse Decreased from with 500 mg po qid for 14 days to 125 mg for 14 days because of patient skepticism;  Vancomyin po then 125 mg po BID for 7 days, then 125 mg Daily for 7 days, then 125 mg EOD for 7 days and then 125 mg po Every 3 days for 14 days -When necessary Zofran for nausea and vomitingIV q8hprn , Morphine 2 mg IV q4hprn for pain -C. difficile PCR Positive for Toxin and Antigen -C/w Normal Saline IVF at 100 mL/hr -Clear Liquid Diet advanced to FULL Liquid Diet -WBC went from 10.7 -> 7.4 -> 6.6 -> 6.8 -CT Abdomen and Pelvis showed Hepatic and BILATERAL renal cysts. LEFT adrenal adenoma 2.2 x 1.5 cm. Mild scattered wall thickening of the colon suspicious for colitis. Aortic atherosclerosis and minimal coronary arterial calcification. Remainder of exam unremarkable. -Continue to follow Infectious Disease Recommendations and appreciate Dr. Lucianne Lei Dam's Consultation and Evaluation -May need another means of fecal transplant in the future such as enemas/NG tube.  -* Had a lengthy discussion with the patient about vancomycin taper as she was concerned and did not want to start it. Discussed with her it was recommeneded by Id. Patient was adamantly refusing care and then changed her mind and agreed to start taper. Needs re-assurance from Infectious Diseases that this was their recommendations.  -Avoid unnecessary Abx, PPI, and Pepcid.   HTN: -C/w Amlodipine 5 mg po Daily and with Lisinopril 40 mg po Daily  Depression and Anxiety:  -Stable, no suicidal or homicidal ideations. -C/w Mirazapine 45 mg po Daily and with Lorazepam 1 mg po BID -C/w 0.5 mg po Lorazepam at 1700 daily  Hypokalemia:  -Improved. K+ was now 3.8 -K+ was  3.4 on admission. -Replete as Necessary -Repeat CMP in AM  Migraine Headaches -C/w Sumatriptan 25 mg  po q2hprn Migraine  Left Adrenal Adenoma:  -Patient had incidental finding of  left adrenal adenoma 2.2 x 1.5 cm by CT scan. -Not new as had mass seen on CT of Abdomen dated 01/31/2000 -F/u with PCP  DVT prophylaxis: SCD's and Lovenox Code Status: FULL  CODE Family Communication: No Family present at bedside Disposition Plan: Home in next 1-2 days pending tolerance of diet  Consultants:   Infectious Diseases Dr. Tommy Medal   Procedures: None   Antimicrobials: po Vancomycin 09/05/16 -->  Subjective: Seen and examined at bedside and states she thinks she is getting better. Very happy with reducing initial Vancomycin Pulse to 125 mg po QiD. States Abdomen isnt hurting as bad and she is not having as many bowel movements. No other concerns or complaints at this time.   Objective: Vitals:   09/06/16 1424 09/06/16 2200 09/07/16 0600 09/07/16 1400  BP: (!) 132/49 (!) 138/59 139/62 (!) 135/56  Pulse: 74 72 72 79  Resp: 18 18 18 18   Temp: 98.2 F (36.8 C) 98.5 F (36.9 C) 98.6 F (37 C) 98.9 F (37.2 C)  TempSrc: Oral Oral Oral Oral  SpO2: 99% 100% 100% 99%  Weight:      Height:        Intake/Output Summary (Last 24 hours) at 09/07/16 1816 Last data filed at 09/07/16 1300  Gross per 24 hour  Intake              480 ml  Output                0 ml  Net              480 ml   Filed Weights   09/04/16 1633  Weight: 46.3 kg (102 lb)    Examination: Physical Exam:  Constitutional: Thin small Elderly female in NAD and appears calm and comfortable Eyes:  Lids and conjunctivae normal, sclerae anicteric  ENMT: External Ears, Nose appear normal. Grossly normal hearing.  Neck: Appears normal, supple, no cervical masses, normal ROM, no appreciable thyromegaly, no JVD Respiratory: Clear to auscultation bilaterally, no wheezing, rales, rhonchi or crackles. Normal respiratory effort and patient is not tachypenic. No accessory muscle use.  Cardiovascular: RRR, no murmurs / rubs / gallops. S1 and S2 auscultated. No extremity edema. Abdomen: Soft, not as tender to palpation today, non-distended. No masses palpated. No appreciable hepatosplenomegaly. Bowel sounds hyperactive positive x4.  GU: Deferred. Musculoskeletal: No clubbing / cyanosis  of digits/nails. No joint deformity upper and lower extremities..  Skin: No rashes, lesions, ulcers on limited skin evaluation. No induration; Warm and dry.  Neurologic: CN 2-12 grossly intact with no focal deficits. Sensation intact in all 4 Extremities. Romberg sign cerebellar reflexes not assessed.  Psychiatric: Normal judgment and insight. Alert and oriented x 3. Anxious mood and appropriate affect.   Data Reviewed: I have personally reviewed following labs and imaging studies  CBC:  Recent Labs Lab 09/04/16 1900 09/05/16 0946 09/06/16 0510 09/07/16 0534  WBC 10.7* 7.4 6.6 6.8  NEUTROABS 7.2 4.2 2.9 3.2  HGB 12.1 11.4* 11.5* 11.5*  HCT 36.5 33.9* 34.9* 34.0*  MCV 94.3 93.4 93.3 92.1  PLT 230 244 239 [id number]   Basic Metabolic Panel:  Recent Labs Lab 09/04/16 1900 09/05/16 0946 09/06/16 0510 09/07/16 0534  NA 141 143 144 143  K 3.4* 3.9 3.8 3.8  CL 108 112* 113* 113*  CO2 25 24 21* 23  GLUCOSE 114* 102* 75 86  BUN 18 11  9 <5*  CREATININE 0.74 0.76 0.75 0.68  CALCIUM 9.3 8.3* 8.0* 7.8*  MG  --  2.0 2.0 1.9  PHOS  --  2.7 2.3* 1.6*   GFR: Estimated Creatinine Clearance: 38.3 mL/min (by C-G formula based on SCr of 0.68 mg/dL). Liver Function Tests:  Recent Labs Lab 09/05/16 0946 09/06/16 0510 09/07/16 0534  AST 21 24 28   ALT 20 24 25   ALKPHOS 61 58 51  BILITOT 0.4 0.7 0.4  PROT 6.6 6.3* 6.3*  ALBUMIN 3.9 3.7 3.5   No results for input(s): LIPASE, AMYLASE in the last 168 hours. No results for input(s): AMMONIA in the last 168 hours. Coagulation Profile: No results for input(s): INR, PROTIME in the last 168 hours. Cardiac Enzymes: No results for input(s): CKTOTAL, CKMB, CKMBINDEX, TROPONINI in the last 168 hours. BNP (last 3 results) No results for input(s): PROBNP in the last 8760 hours. HbA1C: No results for input(s): HGBA1C in the last 72 hours. CBG:  Recent Labs Lab 09/05/16 0809 09/06/16 0731 09/06/16 0838 09/07/16 0756  GLUCAP 113* 64* 118*  137*   Lipid Profile: No results for input(s): CHOL, HDL, LDLCALC, TRIG, CHOLHDL, LDLDIRECT in the last 72 hours. Thyroid Function Tests: No results for input(s): TSH, T4TOTAL, FREET4, T3FREE, THYROIDAB in the last 72 hours. Anemia Panel: No results for input(s): VITAMINB12, FOLATE, FERRITIN, TIBC, IRON, RETICCTPCT in the last 72 hours. Sepsis Labs:  Recent Labs Lab 09/05/16 0227  LATICACIDVEN 0.9    Recent Results (from the past 240 hour(s))  Stool Culture     Status: None   Collection Time: 09/02/16 12:08 PM  Result Value Ref Range Status   Organism ID, Bacteria   Final    No Salmonella,Shigella,Campylobacter,Yersinia,or No E.coli 0157:H7 isolated.   Clostridium Difficile by PCR     Status: None   Collection Time: 09/02/16 12:08 PM  Result Value Ref Range Status   Toxigenic C Difficile by pcr CANCELED Not Detected     Comment: After repeat analysis, non-amplification of the internal control suggests the presence of PCR inhibitors in the patient sample. An additional sample should be submitted for testing if clinically warranted. This test is for use only with liquid or soft stools; performance characteristics of other clinical specimen types have not been established.   This assay was performed by Cepheid GeneXpert(R) PCR. The performance characteristics of this assay have been determined by [employer]. Performance characteristics refer to the analytical performance of the test.  Result canceled by the ancillary   C difficile quick scan w PCR reflex     Status: Abnormal   Collection Time: 09/04/16  8:50 PM  Result Value Ref Range Status   C Diff antigen POSITIVE (A) NEGATIVE Final   C Diff toxin POSITIVE (A) NEGATIVE Final   C Diff interpretation Toxin producing C. difficile detected.  Final    Comment: CRITICAL RESULT CALLED TO, READ BACK BY AND VERIFIED WITH: Athena Masse, RN @0259  09/05/16 MKELLY,MLT Performed at [facility]     Radiology  Studies: No results found. Scheduled Meds: . amLODipine  5 mg Oral Daily  . calcium-vitamin D  1 tablet Oral BID WC  . chlorhexidine  15 mL Mouth Rinse BID  . enoxaparin (LOVENOX) injection  30 mg Subcutaneous Q24H  . lisinopril  40 mg Oral Daily  . loratadine  10 mg Oral Daily  . LORazepam  0.5 mg Oral QPM  . LORazepam  1 mg Oral BID AC & HS  . mouth rinse  15 mL Mouth Rinse q12n4p  . mirtazapine  45 mg Oral QHS  . multivitamin with minerals  1 tablet Oral Daily  . vancomycin  125 mg Oral QID   Followed by  . [START ON 09/19/2016] vancomycin  125 mg Oral BID   Followed by  . [START ON 09/27/2016] vancomycin  125 mg Oral Daily   Followed by  . [START ON 10/04/2016] vancomycin  125 mg Oral QODAY   Followed by  . [START ON 10/12/2016] vancomycin  125 mg Oral Q3 days   Continuous Infusions: . sodium chloride 100 mL/hr at 09/06/16 1913    LOS: 2 days   Kerney Elbe, DO [facility] Pager [phone]  If 7PM-7AM, please contact night-coverage [email or web address] Password [facility] 09/07/2016, 6:16 PM

## 2016-09-08 LAB — COMPREHENSIVE METABOLIC PANEL
ALK PHOS: 56 U/L (ref 38–126)
ALT: 27 U/L (ref 14–54)
ANION GAP: 7 (ref 5–15)
AST: 29 U/L (ref 15–41)
Albumin: 3.7 g/dL (ref 3.5–5.0)
BUN: <5 mg/dL — ABNORMAL LOW (ref 6–20)
CALCIUM: 8.6 mg/dL — AB (ref 8.9–10.3)
CO2: 24 mmol/L (ref 22–32)
Chloride: 114 mmol/L — ABNORMAL HIGH (ref 101–111)
Creatinine, Ser: 0.65 mg/dL (ref 0.44–1.00)
GFR calc non Af Amer: >60 mL/min (ref >60)
Glucose, Bld: 96 mg/dL (ref 65–99)
Potassium: 3.6 mmol/L (ref 3.5–5.1)
SODIUM: 145 mmol/L (ref 135–145)
Total Bilirubin: 0.6 mg/dL (ref 0.3–1.2)
Total Protein: 6.6 g/dL (ref 6.5–8.1)

## 2016-09-08 LAB — CBC WITH DIFFERENTIAL/PLATELET
Basophils Absolute: 0 10*3/uL (ref 0.0–0.1)
Basophils Relative: 1 %
EOS ABS: 0.3 10*3/uL (ref 0.0–0.7)
EOS PCT: 4 %
HCT: 36.4 % (ref 36.0–46.0)
HEMOGLOBIN: 12.6 g/dL (ref 12.0–15.0)
LYMPHS ABS: 2.8 10*3/uL (ref 0.7–4.0)
Lymphocytes Relative: 42 %
MCH: 31.4 pg (ref 26.0–34.0)
MCHC: 34.6 g/dL (ref 30.0–36.0)
MCV: 90.8 fL (ref 78.0–100.0)
MONO ABS: 0.8 10*3/uL (ref 0.1–1.0)
MONOS PCT: 11 %
Neutro Abs: 2.8 10*3/uL (ref 1.7–7.7)
Neutrophils Relative %: 42 %
PLATELETS: 256 10*3/uL (ref 150–400)
RBC: 4.01 MIL/uL (ref 3.87–5.11)
RDW: 12.5 % (ref 11.5–15.5)
WBC: 6.7 10*3/uL (ref 4.0–10.5)

## 2016-09-08 LAB — MAGNESIUM: Magnesium: 1.9 mg/dL (ref 1.7–2.4)

## 2016-09-08 LAB — GLUCOSE, CAPILLARY: GLUCOSE-CAPILLARY: 93 mg/dL (ref 65–99)

## 2016-09-08 LAB — PHOSPHORUS: Phosphorus: 2.5 mg/dL (ref 2.5–4.6)

## 2016-09-08 MED ORDER — VANCOMYCIN 50 MG/ML ORAL SOLUTION: 125.0000 mg | ORAL | 0 refills | Status: DC

## 2016-09-08 MED ORDER — VANCOMYCIN 50 MG/ML ORAL SOLUTION: 125.0000 mg | Freq: Two times a day (BID) | ORAL | 0 refills | Status: AC

## 2016-09-08 MED ORDER — VANCOMYCIN 50 MG/ML ORAL SOLUTION: 125.0000 mg | Freq: Every day | ORAL | 0 refills | Status: AC

## 2016-09-08 MED ORDER — VANCOMYCIN 50 MG/ML ORAL SOLUTION: 125.0000 mg | Freq: Four times a day (QID) | ORAL | 0 refills | Status: AC

## 2016-09-08 MED ORDER — VANCOMYCIN 50 MG/ML ORAL SOLUTION: 125.0000 mg | ORAL | 0 refills | Status: AC

## 2016-09-08 NOTE — Discharge Summary (Signed)
Physician Discharge Summary  Christina Escobar [id number] DOB: [birth year] DOA: 09/04/2016  PCP: Haywood Pao, MD  Admit date: 09/04/2016 Discharge date: 09/08/2016  Admitted From: [address]/Home Disposition:  Home  Recommendations for Outpatient Follow-up:  1. Follow up with PCP in 1-2 weeks 2. Follow up with Infectious Diseases Dr.  3. Please obtain BMP/CBC in one week 4. Please follow up on the following pending results:  Home Health: No Equipment/Devices: None  Discharge Condition: Stable CODE STATUS: FULL  Diet recommendation: Heart Healthy   Brief/Interim Summary: Christina Vanderbush Shelburneis a 82 y.o.femalewith medical history significant of recurrent C. difficile colitis, hypertension, Hepression, anxiety, IBS, diverticulosis, migraine headache, who presents with diarrhea and abdominal pain. Pt has hx of cdiff with multiple hospitalizations. Patient has been followed up by ID, Dr. Baxter Flattery. He hadfecal transplant 2 weeks ago. She recently completed two-week course of vancomycin, stoppedon 08/18/16. Pt states that she has worsening diarrhea in the past 6 days.She has 10-12 times of bowel movements with loosestool. She had one episode of diarrhea with mucus and little blood. She has mild lower abdominal pain, no nausea, vomiting. She has mild subjective fever and chills. She denies chest pain, shortness of breath, symptoms of UTI or unilateral weakness. Thept was found to have WBC 10.7, potassium 3.4, lactate 0.9, creatinine normal, temperature 99.1. CT abdomen/pelvis showed mild scattered wall thickening of the colon suspicious for colitis; hepatic and BILATERAL renal cysts; left adrenal adenoma 2.2 x 1.5 cm. Patient was worked up for recurrent C.Difficile and seen by Infectious Diseases Dr. Tommy Medal who recommended Restarting Vancomycin Taper. She slowly started improving after receiving taper and stated diarrhea was slowing down and at this time was deemed medically  stable to be D/C'd home and follow up with PCP and Infectious disease   Discharge Diagnoses:  Principal Problem:   Clostridium difficile diarrhea Active Problems:   Essential hypertension   Anxiety state   Depression   Hypokalemia   Diarrhea   Diarrhea of presumed infectious origin  Recurrent Clostridium difficile Colitis and Diarrhea: -This recurrent issue. Patient is s/p of fecal transplantation.  -Clinically patient is not septic. Lactate was normal. Hemodynamically stable.  -ID was consulted, Dr. Tommy Medal and recommends Vancomycin Taper.Vancomyin Pulse Decreased from with 500 mg po qid for 14 days to 125 mg for 14 days because of patient skepticism; Vancomyin po then 125 mg po BID for 7 days, then 125 mg Daily for 7 days, then 125 mg EOD for 7 days and then 125 mg po Every 3 days for 14 days -C. difficile PCR Positive for Toxin and Antigen -Tolerated Soft Diet -CT Abdomen and Pelvis showed Hepatic and BILATERAL renal cysts. LEFT adrenal adenoma 2.2 x 1.5 cm. Mild scattered wall thickening of the colon suspicious for colitis. Aortic atherosclerosis and minimal coronary arterial calcification. Remainder of exam unremarkable. -Continue to follow Infectious Disease Recommendations and appreciate Dr. Lucianne Lei Dam's Consultation and Evaluation; Follow Up with Dr. Baxter Flattery as an outpatient.  -May need another means of fecal transplant in the future such as enemas/NG tube.  *-Avoid unnecessary Abx, PPI, and Pepcid.  -Follow up with PCP  HTN: -C/w Amlodipine 5 mg po Daily and with Lisinopril 40 mg po Daily  Depression and Anxiety: -Stable, no suicidal or homicidal ideations. -C/w Mirazapine 45 mg po Daily and with Lorazepam 1 mg po BID -C/w 0.5 mg po Lorazepam at 1700 daily  Hypokalemia:  -Improved. K+ was now 3.6 -K+ was  3.4 on admission. -Replete as Necessary -  Repeat CMP in AM  Migraine Headaches -C/w Sumatriptan 25 mg po q2hprn Migraine  Left Adrenal Adenoma:  -Patient had  incidental finding of left adrenal adenoma 2.2 x 1.5 cm by CT scan. -Not new as had mass seen on CT of Abdomen dated 01/31/2000 -F/u with PCP  Discharge Instructions  Discharge Instructions    Call MD for:  difficulty breathing, headache or visual disturbances    Complete by:  As directed    Call MD for:  extreme fatigue    Complete by:  As directed    Call MD for:  persistant dizziness or light-headedness    Complete by:  As directed    Call MD for:  persistant nausea and vomiting    Complete by:  As directed    Call MD for:  severe uncontrolled pain    Complete by:  As directed    Call MD for:  temperature >100.4    Complete by:  As directed    Diet - low sodium heart healthy    Complete by:  As directed    Discharge instructions    Complete by:  As directed    Follow up with PCP and with Infectious Diseases and call to make appointments. Take all medications as prescribed. If symptoms worsen or change please return to the ED for evaluation.   Increase activity slowly    Complete by:  As directed      Allergies as of 09/08/2016      Reactions   Erythromycin Nausea And Vomiting   Flagyl [metronidazole] Diarrhea   Meloxicam Other (See Comments)   Reaction:  Dizziness    Other    Broad spectrum antibiotic, does not know the name ----c diff      Medication List    TAKE these medications   acetaminophen 500 MG tablet Commonly known as:  TYLENOL Take 1,000 mg by mouth 2 (two) times daily.   acidophilus Caps capsule Take 1 capsule by mouth daily.   amLODipine 5 MG tablet Commonly known as:  NORVASC Take 5 mg by mouth daily.   CALCIUM 600-D 600-400 MG-UNIT Tabs Generic drug:  Calcium Carbonate-Vitamin D3 Take 1 tablet by mouth 2 (two) times daily with breakfast and lunch.   denosumab 60 MG/ML Soln injection Commonly known as:  PROLIA Inject 60 mg into the skin every 6 (six) months.   loratadine 10 MG tablet Commonly known as:  CLARITIN Take 10 mg by mouth  daily.   LORazepam 1 MG tablet Commonly known as:  ATIVAN Take 1 mg by mouth 2 (two) times daily at 8 am and 10 pm.   LORazepam 0.5 MG tablet Commonly known as:  ATIVAN Take 0.5 mg by mouth every evening. Pt takes this dose at 5pm.   mirtazapine 45 MG tablet Commonly known as:  REMERON Take 45 mg by mouth at bedtime.   multivitamin with minerals Tabs tablet Take 1 tablet by mouth daily.   quinapril 40 MG tablet Commonly known as:  ACCUPRIL Take 40 mg by mouth Daily.   SUMAtriptan 25 MG tablet Commonly known as:  IMITREX Take 25 mg by mouth every 2 (two) hours as needed for migraine.   vancomycin 50 mg/mL oral solution Commonly known as:  VANCOCIN Take 2.5 mLs (125 mg total) by mouth 4 (four) times daily. What changed:  when to take this  additional instructions   vancomycin 50 mg/mL oral solution Commonly known as:  VANCOCIN Take 2.5 mLs (125 mg total) by mouth  2 (two) times daily. Start taking on:  09/19/2016 What changed:  You were already taking a medication with the same name, and this prescription was added. Make sure you understand how and when to take each.   vancomycin 50 mg/mL oral solution Commonly known as:  VANCOCIN Take 2.5 mLs (125 mg total) by mouth daily. Start taking on:  09/27/2016 What changed:  You were already taking a medication with the same name, and this prescription was added. Make sure you understand how and when to take each.   vancomycin 50 mg/mL oral solution Commonly known as:  VANCOCIN Take 2.5 mLs (125 mg total) by mouth every other day. Start taking on:  10/04/2016 What changed:  You were already taking a medication with the same name, and this prescription was added. Make sure you understand how and when to take each.   vancomycin 50 mg/mL oral solution Commonly known as:  VANCOCIN Take 2.5 mLs (125 mg total) by mouth every 3 (three) days. Start taking on:  10/12/2016 What changed:  You were already taking a medication with the  same name, and this prescription was added. Make sure you understand how and when to take each.   zolpidem 5 MG tablet Commonly known as:  AMBIEN Take 1 tablet (5 mg total) by mouth at bedtime as needed for sleep.      Follow-up Information    Haywood Pao, MD. Call in 1 week(s).   Specialty:  Internal Medicine Why:  Follow Up within 1 week Contact information: [place] [address] [phone]        Carlyle Basques, MD. Call in 1 week(s).   Specialty:  Infectious Diseases Why:  Call to schedule within 1 week Contact information: [address] [place] [address] [phone]          Allergies  Allergen Reactions  . Erythromycin Nausea And Vomiting  . Flagyl [Metronidazole] Diarrhea  . Meloxicam Other (See Comments)    Reaction:  Dizziness   . Other     Broad spectrum antibiotic, does not know the name ----c diff    Consultations:  Infectious Diseases Dr. Tommy Medal  Procedures/Studies: Ct Abdomen Pelvis W Contrast  Result Date: 09/04/2016 CLINICAL DATA:  Celsius difficile colitis, post fecal transplant, loose stools for 6 days, lower abdominal pain, history irritable bowel syndrome EXAM: CT ABDOMEN AND PELVIS WITH CONTRAST TECHNIQUE: Multidetector CT imaging of the abdomen and pelvis was performed using the standard protocol following bolus administration of intravenous contrast. Sagittal and coronal MPR images reconstructed from axial data set. CONTRAST:  121mL ISOVUE-300 IOPAMIDOL (ISOVUE-300) INJECTION 61% IV. Dilute oral contrast. COMPARISON:  03/25/2016 FINDINGS: Lower chest: Minimal dependent atelectasis RIGHT lower lobe. Hepatobiliary: 4 mm RIGHT lobe liver cyst.  Otherwise unremarkable. Pancreas: Normal appearance Spleen: Normal appearance Adrenals/Urinary Tract: BILATERAL renal cysts. LEFT adrenal mass again identified 2.2 x 1.5 cm demonstrating significant washout on delayed images consistent with adrenal adenoma.  RIGHT adrenal gland normal appearance. Ureters and bladder unremarkable. Stomach/Bowel: Appendix surgically absent by history. Mild scattered wall thickening of the colon particularly RIGHT colon and transverse colon. Stomach and remaining bowel loops unremarkable. Vascular/Lymphatic: Atherosclerotic calcification aorta without aneurysm. Few coronary arterial calcifications noted. No definite abdominal or pelvic adenopathy. Reproductive: Atrophic uterus with unremarkable adnexa Other: No free air or free fluid.  Ventral hernias containing fat. Musculoskeletal: No acute osseous findings. IMPRESSION: Hepatic and BILATERAL renal cysts. LEFT adrenal adenoma 2.2 x 1.5 cm. Mild scattered wall thickening  of the colon suspicious for colitis. Aortic atherosclerosis and minimal coronary arterial calcification. Remainder of exam unremarkable. Electronically Signed   By: Lavonia Dana M.D.   On: 09/04/2016 21:41    Subjective: Seen and examined at bedside and was improved. Stated diarrhea was slowing down but still there. No abdominal pain or Cp. Ready to go home.   Discharge Exam: Vitals:   09/07/16 1959 09/08/16 0511  BP: 134/84 (!) 141/66  Pulse: 80 80  Resp: 18 20  Temp: 99.4 F (37.4 C) 98.4 F (36.9 C)   Vitals:   09/07/16 0600 09/07/16 1400 09/07/16 1959 09/08/16 0511  BP: 139/62 (!) 135/56 134/84 (!) 141/66  Pulse: 72 79 80 80  Resp: 18 18 18 20   Temp: 98.6 F (37 C) 98.9 F (37.2 C) 99.4 F (37.4 C) 98.4 F (36.9 C)  TempSrc: Oral Oral Oral Oral  SpO2: 100% 99% 100% 99%  Weight:      Height:       General: Pt is alert, awake, not in acute distress Cardiovascular: RRR, S1/S2 +, no rubs, no gallops Respiratory: CTA bilaterally, no wheezing, no rhonchi Abdominal: Soft, NT, ND, bowel sounds + Extremities: no edema, no cyanosis  The results of significant diagnostics from this hospitalization (including imaging, microbiology, ancillary and laboratory) are listed below for reference.     Microbiology: Recent Results (from the past 240 hour(s))  Stool Culture     Status: None   Collection Time: 09/02/16 12:08 PM  Result Value Ref Range Status   Organism ID, Bacteria   Final    No Salmonella,Shigella,Campylobacter,Yersinia,or No E.coli 0157:H7 isolated.   Clostridium Difficile by PCR     Status: None   Collection Time: 09/02/16 12:08 PM  Result Value Ref Range Status   Toxigenic C Difficile by pcr CANCELED Not Detected     Comment: After repeat analysis, non-amplification of the internal control suggests the presence of PCR inhibitors in the patient sample. An additional sample should be submitted for testing if clinically warranted. This test is for use only with liquid or soft stools; performance characteristics of other clinical specimen types have not been established.   This assay was performed by Cepheid GeneXpert(R) PCR. The performance characteristics of this assay have been determined by [employer]. Performance characteristics refer to the analytical performance of the test.  Result canceled by the ancillary   C difficile quick scan w PCR reflex     Status: Abnormal   Collection Time: 09/04/16  8:50 PM  Result Value Ref Range Status   C Diff antigen POSITIVE (A) NEGATIVE Final   C Diff toxin POSITIVE (A) NEGATIVE Final   C Diff interpretation Toxin producing C. difficile detected.  Final    Comment: CRITICAL RESULT CALLED TO, READ BACK BY AND VERIFIED WITH: Athena Masse, RN @0259  09/05/16 MKELLY,MLT Performed at [facility]: BNP (last 3 results) No results for input(s): BNP in the last 8760 hours. Basic Metabolic Panel:  Recent Labs Lab 09/04/16 1900 09/05/16 0946 09/06/16 0510 09/07/16 0534 09/08/16 0517  NA 141 143 144 143 145  K 3.4* 3.9 3.8 3.8 3.6  CL 108 112* 113* 113* 114*  CO2 25 24 21* 23 24  GLUCOSE 114* 102* 75 86 96  BUN 18 11 9  <5* <5*  CREATININE 0.74 0.76 0.75 0.68 0.65  CALCIUM 9.3 8.3* 8.0*  7.8* 8.6*  MG  --  2.0 2.0 1.9 1.9  PHOS  --  2.7 2.3*  1.6* 2.5   Liver Function Tests:  Recent Labs Lab 09/05/16 0946 09/06/16 0510 09/07/16 0534 09/08/16 0517  AST 21 24 28 29   ALT 20 24 25 27   ALKPHOS 61 58 51 56  BILITOT 0.4 0.7 0.4 0.6  PROT 6.6 6.3* 6.3* 6.6  ALBUMIN 3.9 3.7 3.5 3.7   No results for input(s): LIPASE, AMYLASE in the last 168 hours. No results for input(s): AMMONIA in the last 168 hours. CBC:  Recent Labs Lab 09/04/16 1900 09/05/16 0946 09/06/16 0510 09/07/16 0534 09/08/16 0517  WBC 10.7* 7.4 6.6 6.8 6.7  NEUTROABS 7.2 4.2 2.9 3.2 2.8  HGB 12.1 11.4* 11.5* 11.5* 12.6  HCT 36.5 33.9* 34.9* 34.0* 36.4  MCV 94.3 93.4 93.3 92.1 90.8  PLT 230 244 239 253 256   Cardiac Enzymes: No results for input(s): CKTOTAL, CKMB, CKMBINDEX, TROPONINI in the last 168 hours. BNP: Invalid input(s): POCBNP CBG:  Recent Labs Lab 09/05/16 0809 09/06/16 0731 09/06/16 0838 09/07/16 0756 09/08/16 0737  GLUCAP 113* 64* 118* 137* 93   D-Dimer No results for input(s): DDIMER in the last 72 hours. Hgb A1c No results for input(s): HGBA1C in the last 72 hours. Lipid Profile No results for input(s): CHOL, HDL, LDLCALC, TRIG, CHOLHDL, LDLDIRECT in the last 72 hours. Thyroid function studies No results for input(s): TSH, T4TOTAL, T3FREE, THYROIDAB in the last 72 hours.  Invalid input(s): FREET3 Anemia work up No results for input(s): VITAMINB12, FOLATE, FERRITIN, TIBC, IRON, RETICCTPCT in the last 72 hours. Urinalysis    Component Value Date/Time   COLORURINE YELLOW 04/18/2016 0950   APPEARANCEUR HAZY (A) 04/18/2016 0950   LABSPEC 1.016 04/18/2016 0950   PHURINE 6.0 04/18/2016 0950   GLUCOSEU NEGATIVE 04/18/2016 0950   HGBUR SMALL (A) 04/18/2016 0950   BILIRUBINUR NEGATIVE 04/18/2016 0950   KETONESUR 40 (A) 04/18/2016 0950   PROTEINUR NEGATIVE 04/18/2016 0950   UROBILINOGEN 0.2 09/14/2012 1751   NITRITE NEGATIVE 04/18/2016 0950   LEUKOCYTESUR MODERATE (A)  04/18/2016 0950   Sepsis Labs Invalid input(s): PROCALCITONIN,  WBC,  LACTICIDVEN Microbiology Recent Results (from the past 240 hour(s))  Stool Culture     Status: None   Collection Time: 09/02/16 12:08 PM  Result Value Ref Range Status   Organism ID, Bacteria   Final    No Salmonella,Shigella,Campylobacter,Yersinia,or No E.coli 0157:H7 isolated.   Clostridium Difficile by PCR     Status: None   Collection Time: 09/02/16 12:08 PM  Result Value Ref Range Status   Toxigenic C Difficile by pcr CANCELED Not Detected     Comment: After repeat analysis, non-amplification of the internal control suggests the presence of PCR inhibitors in the patient sample. An additional sample should be submitted for testing if clinically warranted. This test is for use only with liquid or soft stools; performance characteristics of other clinical specimen types have not been established.   This assay was performed by Cepheid GeneXpert(R) PCR. The performance characteristics of this assay have been determined by [employer]. Performance characteristics refer to the analytical performance of the test.  Result canceled by the ancillary   C difficile quick scan w PCR reflex     Status: Abnormal   Collection Time: 09/04/16  8:50 PM  Result Value Ref Range Status   C Diff antigen POSITIVE (A) NEGATIVE Final   C Diff toxin POSITIVE (A) NEGATIVE Final   C Diff interpretation Toxin producing C. difficile detected.  Final    Comment: CRITICAL RESULT CALLED TO, READ BACK BY  AND VERIFIED WITH: Athena Masse, RN @0259  09/05/16 MKELLY,MLT Performed at [facility]    Time coordinating discharge: Over 30 minutes  SIGNED:  Kerney Elbe, DO [facility] 09/08/2016, 3:21 PM Pager [phone]  If 7PM-7AM, please contact night-coverage [email or web address] Password [email or web address]

## 2016-09-08 NOTE — Evaluation (Signed)
Physical Therapy Evaluation Patient Details Name: Christina Escobar MRN: [id number] DOB: [birth year] Today's Date: 09/08/2016   History of Present Illness  pt in with increaed N &V, positive for c Diff again.   Clinical Impression  Pt noted to have general weakness due to present illness, however feel patient can safely return to her independent apartment at [address]. Pt stated she would prefer to increase her strength her self and I reviewed the progression and a few exercises. No f/u needs at this time. Thank you.     Follow Up Recommendations No PT follow up (pt stated she will progress her strength adn activity tolerance independt and aware how to ask for PT if she needs in her facility )    Equipment Recommendations  None recommended by PT    Recommendations for Other Services       Precautions / Restrictions Precautions Precautions: None      Mobility  Bed Mobility Overal bed mobility: Independent                Transfers Overall transfer level: Independent                  Ambulation/Gait Ambulation/Gait assistance: Min guard;Supervision Ambulation Distance (Feet): 250 Feet Assistive device: None;1 person hand held assist Gait Pattern/deviations: Step-through pattern     General Gait Details: pateint started off with some staggering steps, however improved wtih increased distance. Pt stated she had not been up for a while, however towards end fo walk felt more steady. Patient stated this is still nto her baseline, but she is fuctional for her apartment level adn daily activities  Stairs            Wheelchair Mobility    Modified Rankin (Stroke Patients Only)       Balance Overall balance assessment: Modified Independent                                           Pertinent Vitals/Pain Pain Assessment: No/denies pain    Home Living Family/patient expects to be discharged to:: Private residence (at [place]  [address] ) Living Arrangements: Spouse/significant other Available Help at Discharge: Family Type of Home: Independent living facility [facility] ) Home Access: Level entry     Home Layout: One [place]: None      Prior Function Level of Independence: Independent         Comments: likes to attend exercise classes, walk a mile a day,and be active. Has been less able to since the recent events with c diff, has become more weak than her normal baseline      Hand Dominance        Extremity/Trunk Assessment        Lower Extremity Assessment Lower Extremity Assessment: Overall WFL for tasks assessed       Communication   Communication: No difficulties  Cognition Arousal/Alertness: Awake/alert Behavior During Therapy: WFL for tasks assessed/performed Overall Cognitive Status: Within Functional Limits for tasks assessed                      General Comments General comments (skin integrity, edema, etc.): tested pertunbations, turning, feel together, moving head, and picking object off floor, with no LOB    Exercises Other Exercises Other Exercises: reviewed general exercises in addition to walking: toe and heel raises  at sink, slow marching with high knees and intentional balance on single leg support   Assessment/Plan    PT Assessment Patent does not need any further PT services  PT Problem List            PT Treatment Interventions      PT Goals (Current goals can be found in the Care Plan section)  Acute Rehab PT Goals Patient Stated Goal: I am excite dto go home andlooking forward to my granduaghter's shower this weekend  PT Goal Formulation: All assessment and education complete, DC therapy Potential to Achieve Goals: Good    Frequency     Barriers to discharge        Co-evaluation               End of Session   Activity Tolerance: Patient tolerated treatment well Patient left: in bed;with call  bell/phone within reach;with family/visitor present (alarm not reset intentional for pateint was independent and friend present and pt ready to DC home alone as well. Pt very exccited and thankful to not have bed alarm on . Pt asked for this as well. ) Nurse Communication: Mobility status         Time: [id number] PT Time Calculation (min) (ACUTE ONLY): 18 min   Charges:   PT Evaluation $PT Eval Low Complexity: 1 Procedure     PT G CodesClide Dales 05-Oct-2016, 2:58 PM  Clide Dales, PT Pager: [phone] 10/05/16

## 2016-09-09 ENCOUNTER — Telehealth: Payer: Self-pay | Admitting: *Deleted

## 2016-09-09 MED FILL — Lorazepam Inj 2 MG/ML: INTRAMUSCULAR | Qty: 1 | Status: AC

## 2016-09-09 NOTE — Telephone Encounter (Signed)
Patient was admitted to [facility] for recurrent diarrhea 12/22 - 12/26, was restarted on oral vancomycin per ID recommendations.  Upon discharge, she was told to make a follow up appointment with Dr Baxter Flattery this week. Please advise where to schedule patient.  First available appointment with Dr Baxter Flattery is 1/30. Landis Gandy, RN

## 2016-09-10 NOTE — Telephone Encounter (Signed)
We can add her in the 2nd-3rd week of January. She should be on a long taper as I had discussed with kees when I had arranged for her to get admitted

## 2016-09-11 NOTE — Telephone Encounter (Signed)
Patient scheduled for Tuesday 1/16 per Dr Baxter Flattery. She understands this is a work-in appointment. Patient asked if it is ok to take immodium for diarrhea while on the oral vancomycin taper.

## 2016-09-21 ENCOUNTER — Ambulatory Visit
Admission: RE | Admit: 2016-09-21 | Discharge: 2016-09-21 | Disposition: A | Discharge status: Home / Self Care | Payer: Medicare Other | Source: Ambulatory Visit | Attending: Internal Medicine | Admitting: Internal Medicine

## 2016-09-21 DIAGNOSIS — Z1231 Encounter for screening mammogram for malignant neoplasm of breast: Secondary | ICD-10-CM

## 2016-09-25 ENCOUNTER — Telehealth: Payer: Self-pay | Admitting: *Deleted

## 2016-09-25 NOTE — Telephone Encounter (Signed)
Patient called stating she is having five to six bowel movements a day and she is still on the vancomycin. She would like a higher dose of the vanc. Her appointment with Dr. Baxter Flattery is 09/29/16. Currently her bowel movements are formed and not diarrhea.

## 2016-09-25 NOTE — Telephone Encounter (Signed)
I spoke with her to increase her oral vanco to 3 times per day

## 2016-09-29 ENCOUNTER — Telehealth: Payer: Self-pay | Admitting: Gastroenterology

## 2016-09-29 ENCOUNTER — Encounter: Payer: Self-pay | Admitting: Internal Medicine

## 2016-09-29 ENCOUNTER — Ambulatory Visit (INDEPENDENT_AMBULATORY_CARE_PROVIDER_SITE_OTHER): Payer: Medicare Other | Admitting: Internal Medicine

## 2016-09-29 VITALS — BP 159/79 | HR 80 | Temp 97.8°F | Ht 62.0 in | Wt 99.8 lb

## 2016-09-29 DIAGNOSIS — A0471 Enterocolitis due to Clostridium difficile, recurrent: Secondary | ICD-10-CM

## 2016-09-29 NOTE — Patient Instructions (Signed)
Continue on three times a day dosing until Saturday, then switch to twice a day dosing of vancomycin. We will keep you on this dose until we schedule the FMT

## 2016-09-29 NOTE — Progress Notes (Signed)
RFV: recurrent cdifficile Patient ID: Christina Escobar, female   DOB: July 06, 1930, 83 y.o.   MRN: [id number]  HPI Christina Escobar is a 83yo F with recurrent cdifficile who had oral FMT on 12/07 in the office but then was admitted for watery diarrhea, on 12/22, found to be positive for recurrent cdifficile. She was discharge on prolonged vanco taper  She still has roughly 3-5 BM per day. Usually 3 BM in the morning, one prior to eating, after breakfast, then another with physical activity  She has lost 2 addn pounds. She reports being quite anxious about cdiff and worries that she will die of the disease  She was given many diet modifications by prior medical providers and wonder if it is too limiting and contributing to weight loss  Outpatient Encounter Prescriptions as of 09/29/2016  Medication Sig  . acetaminophen (TYLENOL) 500 MG tablet Take 1,000 mg by mouth 2 (two) times daily.   Marland Kitchen acidophilus (RISAQUAD) CAPS capsule Take 1 capsule by mouth daily.  Marland Kitchen amLODipine (NORVASC) 5 MG tablet Take 5 mg by mouth daily.  . Calcium Carbonate-Vitamin D3 (CALCIUM 600-D) 600-400 MG-UNIT TABS Take 1 tablet by mouth 2 (two) times daily with breakfast and lunch.  . denosumab (PROLIA) 60 MG/ML SOLN injection Inject 60 mg into the skin every 6 (six) months.  . loratadine (CLARITIN) 10 MG tablet Take 10 mg by mouth daily.  Marland Kitchen LORazepam (ATIVAN) 0.5 MG tablet Take 0.5 mg by mouth every evening. Pt takes this dose at 5pm.  . LORazepam (ATIVAN) 1 MG tablet Take 1 mg by mouth 2 (two) times daily at 8 am and 10 pm.  . mirtazapine (REMERON) 45 MG tablet Take 45 mg by mouth at bedtime.   . Multiple Vitamin (MULTIVITAMIN WITH MINERALS) TABS tablet Take 1 tablet by mouth daily.  . quinapril (ACCUPRIL) 40 MG tablet Take 40 mg by mouth Daily.   . SUMAtriptan (IMITREX) 25 MG tablet Take 25 mg by mouth every 2 (two) hours as needed for migraine.   . vancomycin (VANCOCIN) 50 mg/mL oral solution Take 2.5 mLs (125 mg total) by  mouth daily.  Marland Kitchen zolpidem (AMBIEN) 5 MG tablet Take 1 tablet (5 mg total) by mouth at bedtime as needed for sleep.  Derrill Memo ON 10/04/2016] vancomycin (VANCOCIN) 50 mg/mL oral solution Take 2.5 mLs (125 mg total) by mouth every other day. (Patient not taking: Reported on 09/29/2016)  . [START ON 10/12/2016] vancomycin (VANCOCIN) 50 mg/mL oral solution Take 2.5 mLs (125 mg total) by mouth every 3 (three) days. (Patient not taking: Reported on 09/29/2016)   No facility-administered encounter medications on file as of 09/29/2016.      Patient Active Problem List   Diagnosis Date Noted  . Diarrhea of presumed infectious origin   . Diarrhea 09/05/2016  . Depression 09/04/2016  . Hypokalemia 09/04/2016  . Recurrent colitis due to Clostridium difficile 05/11/2016  . Essential hypertension 05/11/2016  . Anxiety state 05/11/2016  . Clostridium difficile diarrhea   . Clostridium difficile infection 03/27/2016  . Antibiotic enterocolitis 03/25/2016  . Migraine 02/28/2013  . Hemorrhoids, external, thrombosed 11/05/2011     Health Maintenance Due  Topic Date Due  . TETANUS/TDAP  06/07/1951  . ZOSTAVAX  06/06/1992  . DEXA SCAN  06/06/1997  . PNA vac Low Risk Adult (1 of 2 - PCV13) 06/06/1997     Review of Systems + anxious, watery diarrhea without abdominal cramping, urgency. Denies loss of appetite, but has weight loss.  Physical Exam  BP (!) 159/79   Pulse 80   Temp 97.8 F (36.6 C) (Oral)   Ht 5\' 2"  (1.575 m)   Wt 99 lb 12.8 oz (45.3 kg)   BMI 18.25 kg/m    Physical Exam  Constitutional:  oriented to person, place, and time. appears well-developed and well-nourished. No distress.  HENT: [place]/AT, PERRLA, no scleral icterus Mouth/Throat: Oropharynx is clear and moist. No oropharyngeal exudate.  Cardiovascular: Normal rate, regular rhythm and normal heart sounds. Exam reveals no gallop and no friction rub.  No murmur heard.  Pulmonary/Chest: Effort normal and breath sounds normal. No  respiratory distress.  has no wheezes.  Abdominal: Soft. Bowel sounds are normal.  exhibits no distension. There is no tenderness.  Psychiatric: a normal mood and affect.  behavior is normal.   CBC Lab Results  Component Value Date   WBC 6.7 09/08/2016   RBC 4.01 09/08/2016   HGB 12.6 09/08/2016   HCT 36.4 09/08/2016   PLT 256 09/08/2016   MCV 90.8 09/08/2016   MCH 31.4 09/08/2016   MCHC 34.6 09/08/2016   RDW 12.5 09/08/2016   LYMPHSABS 2.8 09/08/2016   MONOABS 0.8 09/08/2016   EOSABS 0.3 09/08/2016    BMET Lab Results  Component Value Date   NA 145 09/08/2016   K 3.6 09/08/2016   CL 114 (H) 09/08/2016   CO2 24 09/08/2016   GLUCOSE 96 09/08/2016   BUN <5 (L) 09/08/2016   CREATININE 0.65 09/08/2016   CALCIUM 8.6 (L) 09/08/2016   GFRNONAA >60 09/08/2016   GFRAA >60 09/08/2016      Assessment and Plan  Recurrent c.difficile =  Continue on tid dosing with oral vanco, switch to bid dosing on Friday and stay at that dosing until she sees dr. Edison Nasuti. We will move forward with doing CSY FMT  she may need to hire help to help her with bowel prep and getting her to the hospital vs admit to the hospital for bowel prep

## 2016-09-29 NOTE — Telephone Encounter (Signed)
I received word from Dr. Graylon Good.   She needs rov with me or extender in next 1-2 weeks (double book in PM with me if needed).  For recurrent C. Diff.  Thanks

## 2016-10-02 NOTE — Telephone Encounter (Signed)
10/16/16 9 am with Tye Savoy NP pt aware

## 2016-10-05 ENCOUNTER — Other Ambulatory Visit: Payer: Self-pay | Admitting: *Deleted

## 2016-10-05 DIAGNOSIS — A0472 Enterocolitis due to Clostridium difficile, not specified as recurrent: Secondary | ICD-10-CM

## 2016-10-05 MED ORDER — VANCOMYCIN 50 MG/ML ORAL SOLUTION: 125.0000 mg | Freq: Two times a day (BID) | ORAL | 0 refills | Status: AC

## 2016-10-15 ENCOUNTER — Encounter: Payer: Self-pay | Admitting: Internal Medicine

## 2016-10-15 ENCOUNTER — Ambulatory Visit (INDEPENDENT_AMBULATORY_CARE_PROVIDER_SITE_OTHER): Payer: Medicare Other | Admitting: Internal Medicine

## 2016-10-15 VITALS — BP 147/79 | HR 76 | Temp 98.3°F | Ht 64.0 in | Wt 99.8 lb

## 2016-10-15 DIAGNOSIS — K649 Unspecified hemorrhoids: Secondary | ICD-10-CM

## 2016-10-15 DIAGNOSIS — A0471 Enterocolitis due to Clostridium difficile, recurrent: Secondary | ICD-10-CM

## 2016-10-15 NOTE — Progress Notes (Signed)
RFV: recurrent cdifficile  Patient ID: Christina Escobar, female   DOB: 1930-06-25, 83 y.o.   MRN: [id number]  HPI Christina Escobar is an 83yo F with recurrent cdifficile. Has appt with dr Eugenia Pancoast PA, paula guenther to discuss FMT. She states that her stools are formed though has small frequent bowel movements ranging from 2-6 per day. She has had 3 BM today. She is suffering from hemorrhoids lately and is concerned about doing the bowel preparation for colonoscopy. She states the only change in her diet is change of probiotics  Outpatient Encounter Prescriptions as of 10/15/2016  Medication Sig  . acetaminophen (TYLENOL) 500 MG tablet Take 1,000 mg by mouth 2 (two) times daily.   Marland Kitchen acidophilus (RISAQUAD) CAPS capsule Take 1 capsule by mouth daily.  Marland Kitchen amLODipine (NORVASC) 5 MG tablet Take 5 mg by mouth daily.  . Calcium Carbonate-Vitamin D3 (CALCIUM 600-D) 600-400 MG-UNIT TABS Take 1 tablet by mouth 2 (two) times daily with breakfast and lunch.  . denosumab (PROLIA) 60 MG/ML SOLN injection Inject 60 mg into the skin every 6 (six) months.  . loratadine (CLARITIN) 10 MG tablet Take 10 mg by mouth daily.  Marland Kitchen LORazepam (ATIVAN) 0.5 MG tablet Take 0.5 mg by mouth every evening. Pt takes this dose at 5pm.  . LORazepam (ATIVAN) 1 MG tablet Take 1 mg by mouth 2 (two) times daily at 8 am and 10 pm.  . mirtazapine (REMERON) 45 MG tablet Take 45 mg by mouth at bedtime.   . Multiple Vitamin (MULTIVITAMIN WITH MINERALS) TABS tablet Take 1 tablet by mouth daily.  . quinapril (ACCUPRIL) 40 MG tablet Take 40 mg by mouth Daily.   . SUMAtriptan (IMITREX) 25 MG tablet Take 25 mg by mouth every 2 (two) hours as needed for migraine.   . vancomycin (VANCOCIN) 50 mg/mL oral solution Take 2.5 mLs (125 mg total) by mouth 2 (two) times daily.  Marland Kitchen zolpidem (AMBIEN) 5 MG tablet Take 1 tablet (5 mg total) by mouth at bedtime as needed for sleep.   No facility-administered encounter medications on file as of 10/15/2016.       Patient Active Problem List   Diagnosis Date Noted  . Diarrhea of presumed infectious origin   . Diarrhea 09/05/2016  . Depression 09/04/2016  . Hypokalemia 09/04/2016  . Recurrent colitis due to Clostridium difficile 05/11/2016  . Essential hypertension 05/11/2016  . Anxiety state 05/11/2016  . Clostridium difficile diarrhea   . Clostridium difficile infection 03/27/2016  . Antibiotic enterocolitis 03/25/2016  . Migraine 02/28/2013  . Hemorrhoids, external, thrombosed 11/05/2011     Health Maintenance Due  Topic Date Due  . TETANUS/TDAP  06/07/1951  . ZOSTAVAX  06/06/1992  . DEXA SCAN  06/06/1997  . PNA vac Low Risk Adult (1 of 2 - PCV13) 06/06/1997     Review of Systems +frequent bm but not having diarrhea, occ trace blood on toilet paper, but no melenic stool. Still has fatigue. Otherwise 10 point ros is negative Physical Exam   BP (!) 147/79   Pulse 76   Temp 98.3 F (36.8 C) (Oral)   Ht 5\' 4"  (1.626 m)   Wt 99 lb 12.8 oz (45.3 kg)   BMI 17.13 kg/m  Physical Exam  Constitutional:  oriented to person, place, and time. appears well-developed and well-nourished. No distress.  HENT: [place]/AT, PERRLA, no scleral icterus Mouth/Throat: Oropharynx is clear and moist. No oropharyngeal exudate.  Cardiovascular: Normal rate, regular rhythm and normal heart sounds. Exam reveals no gallop  and no friction rub.  No murmur heard.  Pulmonary/Chest: Effort normal and breath sounds normal. No respiratory distress.  has no wheezes.  Neck = supple, no nuchal rigidity Abdominal: Soft. Bowel sounds are normal.  exhibits no distension. There is no tenderness.  Skin: Skin is warm and dry. No rash noted. No erythema.  Psychiatric: a normal mood and affect.  behavior is normal.   CBC Lab Results  Component Value Date   WBC 6.7 09/08/2016   RBC 4.01 09/08/2016   HGB 12.6 09/08/2016   HCT 36.4 09/08/2016   PLT 256 09/08/2016   MCV 90.8 09/08/2016   MCH 31.4 09/08/2016   MCHC 34.6  09/08/2016   RDW 12.5 09/08/2016   LYMPHSABS 2.8 09/08/2016   MONOABS 0.8 09/08/2016   EOSABS 0.3 09/08/2016    BMET Lab Results  Component Value Date   NA 145 09/08/2016   K 3.6 09/08/2016   CL 114 (H) 09/08/2016   CO2 24 09/08/2016   GLUCOSE 96 09/08/2016   BUN <5 (L) 09/08/2016   CREATININE 0.65 09/08/2016   CALCIUM 8.6 (L) 09/08/2016   GFRNONAA >60 09/08/2016   GFRAA >60 09/08/2016    Assessment and Plan  Recurrent cdifficile = she is currently on vancomycin 125mg  BID. We will plan on her increase to treatment doses for 10 days prior to her FMT. We will have her stop oral vanco the 2 days prior to her FMT. She is to take immodium the morning of the FMT  Will coordinate with dr. Ardis Hughs office for scheduling of FMT and ordering of stool preparation  Hemorrhoidal pain = continue with topical ointment and consider sitz baths  Spent 35 min with greater than 50% in coordination of care for recurrent cdifficile

## 2016-10-16 ENCOUNTER — Other Ambulatory Visit: Payer: Self-pay

## 2016-10-16 ENCOUNTER — Encounter: Payer: Self-pay | Admitting: Physician Assistant

## 2016-10-16 ENCOUNTER — Telehealth: Payer: Self-pay

## 2016-10-16 ENCOUNTER — Ambulatory Visit (INDEPENDENT_AMBULATORY_CARE_PROVIDER_SITE_OTHER): Payer: Medicare Other | Admitting: Physician Assistant

## 2016-10-16 VITALS — BP 118/60 | HR 84 | Ht 61.5 in | Wt 100.4 lb

## 2016-10-16 DIAGNOSIS — A0471 Enterocolitis due to Clostridium difficile, recurrent: Secondary | ICD-10-CM | POA: Diagnosis not present

## 2016-10-16 MED ORDER — NA SULFATE-K SULFATE-MG SULF 17.5-3.13-1.6 GM/177ML PO SOLN: 1.0000 | Freq: Once | ORAL | 0 refills | Status: AC

## 2016-10-16 NOTE — Telephone Encounter (Signed)
Dr. Ardis Hughs and Dr. Baxter Flattery both agreed to do fecal transplant on 10/22/16 at [facility] at 12:00pm. Left a message with Dr. Storm Frisk office to ask about stool donor bank and them to return my call. Called patient and instructed patient over the phone on how to prep for her procedure. Patient verbalized understanding.

## 2016-10-16 NOTE — Progress Notes (Signed)
Chief Complaint: Recurrent C. difficile  HPI:  Christina Escobar is an 83 year old Caucasian female with a past medical history of anal fissure, anxiety, C. difficile, depression, hemorrhoids, hypertension, IBS and migraines,  who was referred to me by Tisovec, Fransico Him, MD for a complaint of recurrent C. difficile.    Most recent labs were completed 09/15/16 and showed a normal CMP and CBC.  Per chart review the patient has been following with infectious disease regarding her recurrent C. difficile. She had oral fecal transplant on 12/7 but was then admitted to the hospital for watery diarrhea on 12/22 and was found to be positive for recurrent C. difficile. She was discharged on a prolonged vancomycin taper. Patient was last seen in ID's  office on 09/29/16 and at that time was having 3-5 bowel movements per day with usually 3 bowel movements in the morning, 1 prior to eating, then after breakfast and another with physical activity. Patient continued to lose weight at that office visit and it was recommended she proceed with another fecal transplant.   Today, the patient tells me she is currently on twice a day dosing of Vancomycin and is here to get her fecal transplant arranged. She tells me that she has had C. difficile now 5 times over the past year or so. She tells me that previously she was able to regain most of her strength back but after this last diagnosis at the end of December she has not been able to do the things she normally does daily including walking her dog or exercising. Patient continues with at least 3-5 bowel movements per day. She also describes that with her first bowel movement of the day, she will experience a lot of bilateral lower abdominal cramping which seems to get better with each bowel movement that she has throughout the day. She also describes seeing intermittent bright red blood on the toilet paper after wiping. She has been applying Preparation H to this for what she  thinks are hemorrhoids. She is somewhat depressed as this has been going on so long and is worried about the prep for the colonoscopy.    Patient denies fever, chills, melena, anorexia, nausea, vomiting, heartburn or reflux  Past Medical History:  Diagnosis Date  . Allergy   . Anal fissure   . Anxiety   . Basal cell carcinoma   . Cataracts, bilateral   . Clostridium difficile diarrhea   . Depression   . Diverticulosis    hx of stricture in colon  . Hemorrhoids   . Hypertension   . IBS (irritable bowel syndrome)   . Insomnia   . Migraine   . Osteopenia   . Thrombosed hemorrhoids     Past Surgical History:  Procedure Laterality Date  . APPENDECTOMY    . BASAL CELL CARCINOMA EXCISION  [id number]  . CATARACT EXTRACTION, BILATERAL Bilateral 1999  . COLON RESECTION  2001  . INCISION AND DRAINAGE  11/05/2011   thrombosed hemorrhoid  . TRANSANAL EXCISION OF RECTAL MASS WITH HEMORRHOID INJECTION  2007   anal fissure    Current Outpatient Prescriptions  Medication Sig Dispense Refill  . acetaminophen (TYLENOL) 500 MG tablet Take 1,000 mg by mouth 2 (two) times daily.     Marland Kitchen acidophilus (RISAQUAD) CAPS capsule Take 1 capsule by mouth daily.    Marland Kitchen amLODipine (NORVASC) 5 MG tablet Take 5 mg by mouth daily.    . Calcium Carbonate-Vitamin D3 (CALCIUM 600-D) 600-400 MG-UNIT TABS Take 1 tablet by  mouth 2 (two) times daily with breakfast and lunch.    . denosumab (PROLIA) 60 MG/ML SOLN injection Inject 60 mg into the skin every 6 (six) months.    . loratadine (CLARITIN) 10 MG tablet Take 10 mg by mouth daily.    Marland Kitchen LORazepam (ATIVAN) 0.5 MG tablet Take 0.5 mg by mouth every evening. Pt takes this dose at 5pm.    . LORazepam (ATIVAN) 1 MG tablet Take 1 mg by mouth 2 (two) times daily at 8 am and 10 pm.    . mirtazapine (REMERON) 45 MG tablet Take 45 mg by mouth at bedtime.     . Multiple Vitamin (MULTIVITAMIN WITH MINERALS) TABS tablet Take 1 tablet by mouth daily.    . quinapril (ACCUPRIL) 40  MG tablet Take 40 mg by mouth Daily.     . SUMAtriptan (IMITREX) 25 MG tablet Take 25 mg by mouth every 2 (two) hours as needed for migraine.     . vancomycin (VANCOCIN) 50 mg/mL oral solution Take 2.5 mLs (125 mg total) by mouth 2 (two) times daily. 10 mL 0  . zolpidem (AMBIEN) 5 MG tablet Take 1 tablet (5 mg total) by mouth at bedtime as needed for sleep.     No current facility-administered medications for this visit.     Allergies as of 10/16/2016 - Review Complete 10/16/2016  Allergen Reaction Noted  . Erythromycin Nausea And Vomiting 12/05/2014  . Flagyl [metronidazole] Diarrhea 02/28/2013  . Meloxicam Other (See Comments) 04/18/2016  . Other  09/05/2016    Family History  Problem Relation Age of Onset  . Alzheimer's disease Mother   . Heart failure Mother   . Heart disease Father   . Heart failure Father   . Other Maternal Grandfather     cerebral hemorrhage  . Skin cancer Brother   . Heart disease Brother     Social History   Social History  . Marital status: Married    Spouse name: Paediatric nurse  . Number of children: 2  . Years of education: MA   Occupational History  . retired Pharmacist, hospital    Social History Main Topics  . Smoking status: Never Smoker  . Smokeless tobacco: Never Used  . Alcohol use 0.0 oz/week     Comment: occasional glass of wine -2 per week  . Drug use: No  . Sexual activity: Not on file   Other Topics Concern  . Not on file   Social History Narrative   Pt lives at home with her Spouse. She has 3 children, (1 adopted). Spouse is a retired Film/video editor.   Caffeine Use: 1-2 cups daily.    Review of Systems:    Constitutional: Positive for weight loss and weakness  Skin: No rash  Cardiovascular: No chest pain   Respiratory: No SOB  Gastrointestinal: See HPI and otherwise negative Genitourinary: No dysuria or change in urinary frequency Neurological: No headache Musculoskeletal: No new muscle or joint pain Hematologic: No  bruising Psychiatric: No history of depression or anxiety   Physical Exam:  Vital signs: BP 118/60   Pulse 84   Ht 5' 1.5" (1.562 m)   Wt 100 lb 6 oz (45.5 kg)   BMI 18.66 kg/m   Constitutional:   Pleasant thin-appearing Caucasian female appears to be in NAD, Well developed, Well nourished, alert and cooperative Head:  Normocephalic and atraumatic. Eyes:   PEERL, EOMI. No icterus. Conjunctiva pink. Ears:  Normal auditory acuity. Neck:  Supple Throat: Oral cavity and pharynx without  inflammation, swelling or lesion.  Respiratory: Respirations even and unlabored. Lungs clear to auscultation bilaterally.   No wheezes, crackles, or rhonchi.  Cardiovascular: Normal S1, S2. No MRG. Regular rate and rhythm. No peripheral edema, cyanosis or pallor.  Gastrointestinal:  Soft, nondistended, mild generalized tenderness No rebound or guarding. Normal bowel sounds. No appreciable masses or hepatomegaly. Rectal:  Not performed.  Msk:  Symmetrical without gross deformities. Without edema, no deformity or joint abnormality.  Neurologic:  Alert and  oriented x4;  grossly normal neurologically.  Skin:   Dry and intact without significant lesions or rashes. Psychiatric: Demonstrates good judgement and reason without abnormal affect or behaviors.  RELEVANT LABS AND IMAGING: CBC    Component Value Date/Time   WBC 6.7 09/08/2016 0517   RBC 4.01 09/08/2016 0517   HGB 12.6 09/08/2016 0517   HCT 36.4 09/08/2016 0517   PLT 256 09/08/2016 0517   MCV 90.8 09/08/2016 0517   MCH 31.4 09/08/2016 0517   MCHC 34.6 09/08/2016 0517   RDW 12.5 09/08/2016 0517   LYMPHSABS 2.8 09/08/2016 0517   MONOABS 0.8 09/08/2016 0517   EOSABS 0.3 09/08/2016 0517   BASOSABS 0.0 09/08/2016 0517    CMP     Component Value Date/Time   NA 145 09/08/2016 0517   K 3.6 09/08/2016 0517   CL 114 (H) 09/08/2016 0517   CO2 24 09/08/2016 0517   GLUCOSE 96 09/08/2016 0517   BUN <5 (L) 09/08/2016 0517   CREATININE 0.65  09/08/2016 0517   CALCIUM 8.6 (L) 09/08/2016 0517   PROT 6.6 09/08/2016 0517   ALBUMIN 3.7 09/08/2016 0517   AST 29 09/08/2016 0517   ALT 27 09/08/2016 0517   ALKPHOS 56 09/08/2016 0517   BILITOT 0.6 09/08/2016 0517   GFRNONAA >60 09/08/2016 0517   GFRAA >60 09/08/2016 0517    Assessment: 1. Recurrent C-Diff: Patient is still suffering from C. difficile regardless of prolonged vancomycin taper, this is her fifth episode over the past year or so, she is following with infectious disease and failed oral fecal transplant capsules in December, recommendations are for a fecal transplant at this time  Plan: 1. Arranged for colonoscopy and fecal transplant at [facility] next week with Dr. Ardis Hughs. Did discuss risks, benefits, limitations and alternatives and the patient agrees to proceed. 2. Our staff went over the prep with her. We also discussed this in detail and she feels as though she will be able to do this as long as she can eat clear liquids that day to remain stable. 3. Recommend the patient discontinue her Vancomycin the day before her procedures. 4. We will call Dr. Crissie Figures office in order to coordinate. 5. Patient was prescribed hydrocortisone ointment to be applied twice a day to hemorrhoids 6. Patient to return to clinic per recommendations after time of colonoscopy with Dr. Jerilynn Birkenhead, PA-C [facility] 10/16/2016, 9:53 AM  Cc: Haywood Pao, MD

## 2016-10-16 NOTE — Progress Notes (Signed)
I agree with the above note, plan.  Caren Griffins, what days are good for you next week.  I prefer Tues, Wed or Thursday.

## 2016-10-16 NOTE — Patient Instructions (Signed)
We will contact you regarding your appointment date and time for your fecal transplant.

## 2016-10-16 NOTE — Addendum Note (Signed)
Addended by: Marzella Schlein on: 10/16/2016 02:15 PM   Modules accepted: Orders

## 2016-10-19 NOTE — Telephone Encounter (Signed)
Spoke with ID and they are getting stool from donor bank.

## 2016-10-19 NOTE — Telephone Encounter (Signed)
Answered questions regarding patient's prep instructions.

## 2016-10-19 NOTE — Addendum Note (Signed)
Addended by: Marzella Schlein on: 10/19/2016 11:07 AM   Modules accepted: Orders, SmartSet

## 2016-10-21 ENCOUNTER — Encounter (HOSPITAL_COMMUNITY): Payer: Self-pay | Admitting: *Deleted

## 2016-10-21 NOTE — Progress Notes (Signed)
Spoke with pt for pre-op call. Pt denies cardiac history, chest pain or sob. Pt is not diabetic. 

## 2016-10-22 ENCOUNTER — Ambulatory Visit (HOSPITAL_COMMUNITY): Payer: Medicare Other | Admitting: Anesthesiology

## 2016-10-22 ENCOUNTER — Encounter (HOSPITAL_COMMUNITY): Payer: Self-pay | Admitting: *Deleted

## 2016-10-22 ENCOUNTER — Telehealth: Payer: Self-pay | Admitting: Gastroenterology

## 2016-10-22 ENCOUNTER — Ambulatory Visit (HOSPITAL_COMMUNITY)
Admission: RE | Admit: 2016-10-22 | Discharge: 2016-10-22 | Disposition: A | Discharge status: Home / Self Care | Payer: Medicare Other | Source: Ambulatory Visit | Attending: Gastroenterology | Admitting: Gastroenterology

## 2016-10-22 ENCOUNTER — Encounter (HOSPITAL_COMMUNITY)
Admission: RE | Disposition: A | Discharge status: Home / Self Care | Payer: Self-pay | Source: Ambulatory Visit | Attending: Gastroenterology

## 2016-10-22 DIAGNOSIS — A0472 Enterocolitis due to Clostridium difficile, not specified as recurrent: Secondary | ICD-10-CM | POA: Insufficient documentation

## 2016-10-22 DIAGNOSIS — Z85828 Personal history of other malignant neoplasm of skin: Secondary | ICD-10-CM | POA: Insufficient documentation

## 2016-10-22 DIAGNOSIS — Z79899 Other long term (current) drug therapy: Secondary | ICD-10-CM | POA: Diagnosis not present

## 2016-10-22 DIAGNOSIS — F329 Major depressive disorder, single episode, unspecified: Secondary | ICD-10-CM | POA: Insufficient documentation

## 2016-10-22 DIAGNOSIS — A0471 Enterocolitis due to Clostridium difficile, recurrent: Secondary | ICD-10-CM

## 2016-10-22 DIAGNOSIS — I10 Essential (primary) hypertension: Secondary | ICD-10-CM | POA: Diagnosis not present

## 2016-10-22 DIAGNOSIS — M8580 Other specified disorders of bone density and structure, unspecified site: Secondary | ICD-10-CM | POA: Insufficient documentation

## 2016-10-22 DIAGNOSIS — F419 Anxiety disorder, unspecified: Secondary | ICD-10-CM | POA: Insufficient documentation

## 2016-10-22 HISTORY — PX: FECAL TRANSPLANT: SHX6383

## 2016-10-22 HISTORY — PX: COLONOSCOPY WITH PROPOFOL: SHX5780

## 2016-10-22 SURGERY — COLONOSCOPY WITH PROPOFOL
Anesthesia: Monitor Anesthesia Care

## 2016-10-22 MED ORDER — LACTATED RINGERS IV SOLN
INTRAVENOUS | Status: DC
  Administered 2016-10-22: 11:00:00 via INTRAVENOUS

## 2016-10-22 MED ORDER — SODIUM CHLORIDE 0.9 % IV SOLN: INTRAVENOUS | Status: DC

## 2016-10-22 MED ORDER — PROPOFOL 10 MG/ML IV BOLUS
INTRAVENOUS | Status: DC | PRN
  Administered 2016-10-22: 50 mg via INTRAVENOUS

## 2016-10-22 MED ORDER — LOPERAMIDE HCL 2 MG PO CAPS
4.0000 mg | ORAL_CAPSULE | Freq: Once | ORAL | Status: AC
  Administered 2016-10-22: 4 mg via ORAL
  Filled 2016-10-22 (×2): qty 2

## 2016-10-22 MED ORDER — PROPOFOL 500 MG/50ML IV EMUL
INTRAVENOUS | Status: DC | PRN
  Administered 2016-10-22: 125 ug/kg/min via INTRAVENOUS

## 2016-10-22 MED ORDER — LIDOCAINE HCL (CARDIAC) 20 MG/ML IV SOLN
INTRAVENOUS | Status: DC | PRN
  Administered 2016-10-22: 100 mg via INTRATRACHEAL

## 2016-10-22 SURGICAL SUPPLY — 1 items: PREP MICROBIOTA FECAL (Tissue) ×18 IMPLANT

## 2016-10-22 NOTE — Interval H&P Note (Signed)
History and Physical Interval Note:  10/22/2016 11:35 AM  Christina Escobar  has presented today for surgery, with the diagnosis of c-diff  The various methods of treatment have been discussed with the patient and family. After consideration of risks, benefits and other options for treatment, the patient has consented to  Procedure(s): COLONOSCOPY WITH PROPOFOL (N/A) FECAL TRANSPLANT (N/A) as a surgical intervention .  The patient's history has been reviewed, patient examined, no change in status, stable for surgery.  I have reviewed the patient's chart and labs.  Questions were answered to the patient's satisfaction.     Milus Banister

## 2016-10-22 NOTE — Transfer of Care (Signed)
Immediate Anesthesia Transfer of Care Note  Patient: Christina Escobar  Procedure(s) Performed: Procedure(s): COLONOSCOPY WITH PROPOFOL (N/A) FECAL TRANSPLANT (N/A)  Patient Location: Endoscopy Unit  Anesthesia Type:MAC  Level of Consciousness: awake and patient cooperative  Airway & Oxygen Therapy: Patient Spontanous Breathing and Patient connected to nasal cannula oxygen  Post-op Assessment: Report given to RN and Post -op Vital signs reviewed and stable  Post vital signs: Reviewed and stable  Last Vitals:  Vitals:   10/22/16 1038 10/22/16 1225  BP: (!) 144/79 (!) 151/71  Pulse: 85 79  Resp: 11 (!) 23  Temp: 36.7 C     Last Pain:  Vitals:   10/22/16 1038  TempSrc: Oral         Complications: No apparent anesthesia complications

## 2016-10-22 NOTE — Anesthesia Preprocedure Evaluation (Signed)
Anesthesia Evaluation  Patient identified by MRN, date of birth, ID band Patient awake    Reviewed: Allergy & Precautions, NPO status , Patient's Chart, lab work & pertinent test results  Airway Mallampati: II  TM Distance: >3 FB Neck ROM: Full    Dental no notable dental hx.    Pulmonary neg pulmonary ROS,    Pulmonary exam normal breath sounds clear to auscultation       Cardiovascular hypertension, negative cardio ROS Normal cardiovascular exam Rhythm:Regular Rate:Normal     Neuro/Psych  Headaches, PSYCHIATRIC DISORDERS Anxiety Depression    GI/Hepatic negative GI ROS, Neg liver ROS,   Endo/Other  negative endocrine ROS  Renal/GU negative Renal ROS     Musculoskeletal negative musculoskeletal ROS (+)   Abdominal   Peds  Hematology negative hematology ROS (+)   Anesthesia Other Findings   Reproductive/Obstetrics                             Anesthesia Physical Anesthesia Plan  ASA: III  Anesthesia Plan: MAC   Post-op Pain Management:    Induction: Intravenous  Airway Management Planned:   Additional Equipment:   Intra-op Plan:   Post-operative Plan:   Informed Consent: I have reviewed the patients History and Physical, chart, labs and discussed the procedure including the risks, benefits and alternatives for the proposed anesthesia with the patient or authorized representative who has indicated his/her understanding and acceptance.   Dental advisory given  Plan Discussed with: CRNA  Anesthesia Plan Comments:         Anesthesia Quick Evaluation

## 2016-10-22 NOTE — Anesthesia Postprocedure Evaluation (Signed)
Anesthesia Post Note  Patient: Christina Escobar  Procedure(s) Performed: Procedure(s) (LRB): COLONOSCOPY WITH PROPOFOL (N/A) FECAL TRANSPLANT (N/A)  Patient location during evaluation: PACU Anesthesia Type: MAC Level of consciousness: awake and alert Pain management: pain level controlled Vital Signs Assessment: post-procedure vital signs reviewed and stable Respiratory status: spontaneous breathing Cardiovascular status: stable Anesthetic complications: no       Last Vitals:  Vitals:   10/22/16 1245 10/22/16 1305  BP: (!) 158/69 (!) 165/81  Pulse: 74 82  Resp: (!) 21 19  Temp:      Last Pain:  Vitals:   10/22/16 1038  TempSrc: Oral                 Nolon Nations

## 2016-10-22 NOTE — Discharge Instructions (Signed)

## 2016-10-22 NOTE — H&P (View-Only) (Signed)
Chief Complaint: Recurrent C. difficile  HPI:  Christina Escobar is an 83 year old Caucasian female with a past medical history of anal fissure, anxiety, C. difficile, depression, hemorrhoids, hypertension, IBS and migraines,  who was referred to me by Tisovec, Fransico Him, MD for a complaint of recurrent C. difficile.    Most recent labs were completed 09/15/16 and showed a normal CMP and CBC.  Per chart review the patient has been following with infectious disease regarding her recurrent C. difficile. She had oral fecal transplant on 12/7 but was then admitted to the hospital for watery diarrhea on 12/22 and was found to be positive for recurrent C. difficile. She was discharged on a prolonged vancomycin taper. Patient was last seen in ID's  office on 09/29/16 and at that time was having 3-5 bowel movements per day with usually 3 bowel movements in the morning, 1 prior to eating, then after breakfast and another with physical activity. Patient continued to lose weight at that office visit and it was recommended she proceed with another fecal transplant.   Today, the patient tells me she is currently on twice a day dosing of Vancomycin and is here to get her fecal transplant arranged. She tells me that she has had C. difficile now 5 times over the past year or so. She tells me that previously she was able to regain most of her strength back but after this last diagnosis at the end of December she has not been able to do the things she normally does daily including walking her dog or exercising. Patient continues with at least 3-5 bowel movements per day. She also describes that with her first bowel movement of the day, she will experience a lot of bilateral lower abdominal cramping which seems to get better with each bowel movement that she has throughout the day. She also describes seeing intermittent bright red blood on the toilet paper after wiping. She has been applying Preparation H to this for what she  thinks are hemorrhoids. She is somewhat depressed as this has been going on so long and is worried about the prep for the colonoscopy.    Patient denies fever, chills, melena, anorexia, nausea, vomiting, heartburn or reflux  Past Medical History:  Diagnosis Date  . Allergy   . Anal fissure   . Anxiety   . Basal cell carcinoma   . Cataracts, bilateral   . Clostridium difficile diarrhea   . Depression   . Diverticulosis    hx of stricture in colon  . Hemorrhoids   . Hypertension   . IBS (irritable bowel syndrome)   . Insomnia   . Migraine   . Osteopenia   . Thrombosed hemorrhoids     Past Surgical History:  Procedure Laterality Date  . APPENDECTOMY    . BASAL CELL CARCINOMA EXCISION  [id number]  . CATARACT EXTRACTION, BILATERAL Bilateral 1999  . COLON RESECTION  2001  . INCISION AND DRAINAGE  11/05/2011   thrombosed hemorrhoid  . TRANSANAL EXCISION OF RECTAL MASS WITH HEMORRHOID INJECTION  2007   anal fissure    Current Outpatient Prescriptions  Medication Sig Dispense Refill  . acetaminophen (TYLENOL) 500 MG tablet Take 1,000 mg by mouth 2 (two) times daily.     Marland Kitchen acidophilus (RISAQUAD) CAPS capsule Take 1 capsule by mouth daily.    Marland Kitchen amLODipine (NORVASC) 5 MG tablet Take 5 mg by mouth daily.    . Calcium Carbonate-Vitamin D3 (CALCIUM 600-D) 600-400 MG-UNIT TABS Take 1 tablet by  mouth 2 (two) times daily with breakfast and lunch.    . denosumab (PROLIA) 60 MG/ML SOLN injection Inject 60 mg into the skin every 6 (six) months.    . loratadine (CLARITIN) 10 MG tablet Take 10 mg by mouth daily.    Marland Kitchen LORazepam (ATIVAN) 0.5 MG tablet Take 0.5 mg by mouth every evening. Pt takes this dose at 5pm.    . LORazepam (ATIVAN) 1 MG tablet Take 1 mg by mouth 2 (two) times daily at 8 am and 10 pm.    . mirtazapine (REMERON) 45 MG tablet Take 45 mg by mouth at bedtime.     . Multiple Vitamin (MULTIVITAMIN WITH MINERALS) TABS tablet Take 1 tablet by mouth daily.    . quinapril (ACCUPRIL) 40  MG tablet Take 40 mg by mouth Daily.     . SUMAtriptan (IMITREX) 25 MG tablet Take 25 mg by mouth every 2 (two) hours as needed for migraine.     . vancomycin (VANCOCIN) 50 mg/mL oral solution Take 2.5 mLs (125 mg total) by mouth 2 (two) times daily. 10 mL 0  . zolpidem (AMBIEN) 5 MG tablet Take 1 tablet (5 mg total) by mouth at bedtime as needed for sleep.     No current facility-administered medications for this visit.     Allergies as of 10/16/2016 - Review Complete 10/16/2016  Allergen Reaction Noted  . Erythromycin Nausea And Vomiting 12/05/2014  . Flagyl [metronidazole] Diarrhea 02/28/2013  . Meloxicam Other (See Comments) 04/18/2016  . Other  09/05/2016    Family History  Problem Relation Age of Onset  . Alzheimer's disease Mother   . Heart failure Mother   . Heart disease Father   . Heart failure Father   . Other Maternal Grandfather     cerebral hemorrhage  . Skin cancer Brother   . Heart disease Brother     Social History   Social History  . Marital status: Married    Spouse name: Paediatric nurse  . Number of children: 2  . Years of education: MA   Occupational History  . retired Pharmacist, hospital    Social History Main Topics  . Smoking status: Never Smoker  . Smokeless tobacco: Never Used  . Alcohol use 0.0 oz/week     Comment: occasional glass of wine -2 per week  . Drug use: No  . Sexual activity: Not on file   Other Topics Concern  . Not on file   Social History Narrative   Pt lives at home with her Spouse. She has 3 children, (1 adopted). Spouse is a retired Film/video editor.   Caffeine Use: 1-2 cups daily.    Review of Systems:    Constitutional: Positive for weight loss and weakness  Skin: No rash  Cardiovascular: No chest pain   Respiratory: No SOB  Gastrointestinal: See HPI and otherwise negative Genitourinary: No dysuria or change in urinary frequency Neurological: No headache Musculoskeletal: No new muscle or joint pain Hematologic: No  bruising Psychiatric: No history of depression or anxiety   Physical Exam:  Vital signs: BP 118/60   Pulse 84   Ht 5' 1.5" (1.562 m)   Wt 100 lb 6 oz (45.5 kg)   BMI 18.66 kg/m   Constitutional:   Pleasant thin-appearing Caucasian female appears to be in NAD, Well developed, Well nourished, alert and cooperative Head:  Normocephalic and atraumatic. Eyes:   PEERL, EOMI. No icterus. Conjunctiva pink. Ears:  Normal auditory acuity. Neck:  Supple Throat: Oral cavity and pharynx without  inflammation, swelling or lesion.  Respiratory: Respirations even and unlabored. Lungs clear to auscultation bilaterally.   No wheezes, crackles, or rhonchi.  Cardiovascular: Normal S1, S2. No MRG. Regular rate and rhythm. No peripheral edema, cyanosis or pallor.  Gastrointestinal:  Soft, nondistended, mild generalized tenderness No rebound or guarding. Normal bowel sounds. No appreciable masses or hepatomegaly. Rectal:  Not performed.  Msk:  Symmetrical without gross deformities. Without edema, no deformity or joint abnormality.  Neurologic:  Alert and  oriented x4;  grossly normal neurologically.  Skin:   Dry and intact without significant lesions or rashes. Psychiatric: Demonstrates good judgement and reason without abnormal affect or behaviors.  RELEVANT LABS AND IMAGING: CBC    Component Value Date/Time   WBC 6.7 09/08/2016 0517   RBC 4.01 09/08/2016 0517   HGB 12.6 09/08/2016 0517   HCT 36.4 09/08/2016 0517   PLT 256 09/08/2016 0517   MCV 90.8 09/08/2016 0517   MCH 31.4 09/08/2016 0517   MCHC 34.6 09/08/2016 0517   RDW 12.5 09/08/2016 0517   LYMPHSABS 2.8 09/08/2016 0517   MONOABS 0.8 09/08/2016 0517   EOSABS 0.3 09/08/2016 0517   BASOSABS 0.0 09/08/2016 0517    CMP     Component Value Date/Time   NA 145 09/08/2016 0517   K 3.6 09/08/2016 0517   CL 114 (H) 09/08/2016 0517   CO2 24 09/08/2016 0517   GLUCOSE 96 09/08/2016 0517   BUN <5 (L) 09/08/2016 0517   CREATININE 0.65  09/08/2016 0517   CALCIUM 8.6 (L) 09/08/2016 0517   PROT 6.6 09/08/2016 0517   ALBUMIN 3.7 09/08/2016 0517   AST 29 09/08/2016 0517   ALT 27 09/08/2016 0517   ALKPHOS 56 09/08/2016 0517   BILITOT 0.6 09/08/2016 0517   GFRNONAA >60 09/08/2016 0517   GFRAA >60 09/08/2016 0517    Assessment: 1. Recurrent C-Diff: Patient is still suffering from C. difficile regardless of prolonged vancomycin taper, this is her fifth episode over the past year or so, she is following with infectious disease and failed oral fecal transplant capsules in December, recommendations are for a fecal transplant at this time  Plan: 1. Arranged for colonoscopy and fecal transplant at [facility] next week with Dr. Ardis Hughs. Did discuss risks, benefits, limitations and alternatives and the patient agrees to proceed. 2. Our staff went over the prep with her. We also discussed this in detail and she feels as though she will be able to do this as long as she can eat clear liquids that day to remain stable. 3. Recommend the patient discontinue her Vancomycin the day before her procedures. 4. We will call Dr. Crissie Figures office in order to coordinate. 5. Patient was prescribed hydrocortisone ointment to be applied twice a day to hemorrhoids 6. Patient to return to clinic per recommendations after time of colonoscopy with Dr. Jerilynn Birkenhead, PA-C [facility] 10/16/2016, 9:53 AM  Cc: Haywood Pao, MD

## 2016-10-22 NOTE — Anesthesia Procedure Notes (Signed)
Procedure Name: MAC Date/Time: 10/22/2016 12:04 PM Performed by: Lance Coon Pre-anesthesia Checklist: Patient identified, Emergency Drugs available, Suction available, Patient being monitored and Timeout performed Patient Re-evaluated:Patient Re-evaluated prior to inductionOxygen Delivery Method: Nasal cannula

## 2016-10-22 NOTE — Op Note (Signed)
[facility] Patient Name: Christina Escobar Procedure Date : 10/22/2016 MRN: [id number] Attending MD: Milus Banister , MD Date of Birth: July 16, 1930 CSN: [id number] Age: 83 Admit Type: Outpatient Procedure:                Colonoscopy Indications:              Clostridium difficile diarrhea, recurrent; FMT Providers:                Milus Banister, MD, Vista Lawman, RN, Elspeth Cho Tech., Technician, Lance Coon, CRNA Referring MD:              Medicines:                Monitored Anesthesia Care Complications:            No immediate complications. Estimated blood loss:                            None. Estimated Blood Loss:     Estimated blood loss: none. Procedure:                Pre-Anesthesia Assessment:                           - Prior to the procedure, a History and Physical                            was performed, and patient medications and                            allergies were reviewed. The patient's tolerance of                            previous anesthesia was also reviewed. The risks                            and benefits of the procedure and the sedation                            options and risks were discussed with the patient.                            All questions were answered, and informed consent                            was obtained. Prior Anticoagulants: The patient has                            taken no previous anticoagulant or antiplatelet                            agents. ASA Grade Assessment: III - A patient with  severe systemic disease. After reviewing the risks                            and benefits, the patient was deemed in                            satisfactory condition to undergo the procedure.                           After obtaining informed consent, the colonoscope                            was passed under direct vision. Throughout the      procedure, the patient's blood pressure, pulse, and                            oxygen saturations were monitored continuously. The                            [id number]) scope was introduced through                            the anus and advanced to the the terminal ileum.                            The colonoscopy was performed without difficulty.                            The patient tolerated the procedure well. The                            quality of the bowel preparation was excellent. The                            terminal ileum, the ileocecal valve and the                            appendiceal orifice were photographed. Scope In: 12:10:01 PM Scope Out: 12:20:22 PM Scope Withdrawal Time: 0 hours 3 minutes 5 seconds  Total Procedure Duration: 0 hours 10 minutes 21 seconds  Findings:      The terminal ileum appeared normal. Using commercially available (Open       [employer]) fecal material obtained by ID Dr. Graylon Good, FMT was performed by       flushing about 300cc of fecal material into the terminal ileum.      The exam was otherwise without abnormality on direct and retroflexion       views. Impression:               - The examined portion of the ileum was normal.                           - Infusion of commercially available fecal matter  in the terminal ileum for FMT for recurrent C.                            difficile.                           - The examination was otherwise normal on direct                            and retroflexion views. Moderate Sedation:      none Recommendation:           - Patient has a contact number available for                            emergencies. The signs and symptoms of potential                            delayed complications were discussed with the                            patient. Return to normal activities tomorrow.                            Written discharge instructions were provided to the                             patient.                           - Resume previous diet.                           - Continue present medications. Procedure Code(s):        --- Professional ---                           [phone], Colonoscopy, flexible; diagnostic, including                            collection of specimen(s) by brushing or washing,                            when performed (separate procedure) Diagnosis Code(s):        --- Professional ---                           A04.7, Enterocolitis due to Clostridium difficile CPT copyright 2016 American Medical Association. All rights reserved. The codes documented in this report are preliminary and upon coder review may  be revised to meet current compliance requirements. Milus Banister, MD 10/22/2016 12:39:51 PM This report has been signed electronically. Number of Addenda: 0

## 2016-10-23 ENCOUNTER — Encounter (HOSPITAL_COMMUNITY): Payer: Self-pay | Admitting: Gastroenterology

## 2016-10-23 ENCOUNTER — Telehealth: Payer: Self-pay | Admitting: *Deleted

## 2016-10-23 NOTE — Telephone Encounter (Signed)
Probably not normal.  She should take 2 immodium twice daily for the next 2-3 days and only stop if she becomes constipated.

## 2016-10-23 NOTE — Telephone Encounter (Signed)
Dr Ardis Hughs the pt;s husband states that since the fecal transplant she has had numerous bowel movements.  They want to know if this is normal. Please advise

## 2016-10-23 NOTE — Telephone Encounter (Signed)
Left message on machine to call back  

## 2016-10-23 NOTE — Telephone Encounter (Signed)
Patient's husband called and requested that Dr. Baxter Flattery call the patient. MD to call today. Christina Escobar

## 2016-10-23 NOTE — Telephone Encounter (Signed)
The pt's husband called back and states the pt is much better this afternoon she took imodium and is resting.  He will call back if she does not continue to improve

## 2016-11-25 ENCOUNTER — Telehealth: Payer: Self-pay | Admitting: *Deleted

## 2016-11-25 NOTE — Telephone Encounter (Signed)
Patient called and would like for the doctor to give her a call. She would not tell me why just that she is ok she just needs to speak with the doctor. Advised her the doctor is in clinic and I will let her know the she has called.

## 2016-11-26 NOTE — Telephone Encounter (Signed)
I called her back. She is doing well.

## 2016-12-15 ENCOUNTER — Ambulatory Visit (INDEPENDENT_AMBULATORY_CARE_PROVIDER_SITE_OTHER): Payer: Medicare Other | Admitting: Internal Medicine

## 2016-12-15 ENCOUNTER — Encounter: Payer: Self-pay | Admitting: Internal Medicine

## 2016-12-15 VITALS — BP 138/74 | HR 73 | Temp 97.5°F | Wt 100.0 lb

## 2016-12-15 DIAGNOSIS — A0471 Enterocolitis due to Clostridium difficile, recurrent: Secondary | ICD-10-CM | POA: Diagnosis not present

## 2016-12-15 NOTE — Progress Notes (Signed)
Patient ID: Christina Escobar, female   DOB: [birth year], 83 y.o.   MRN: [id number]  HPI 83yo F with hx of recurrent cdifficile who had FMT on 2/08 and she had watery episode BM post procedure which frightened her in the sense that she was worried the FMT was not retained. She had intermittent bouts of 3-5 bm per day but now improved. She usually only has 2 BM per day. Once waking up and once after dinner. She feels much improved, increased energy and better outlook.  Outpatient Encounter Prescriptions as of 12/15/2016  Medication Sig  . acetaminophen (TYLENOL) 500 MG tablet Take 1,000 mg by mouth 2 (two) times daily.   Marland Kitchen acidophilus (RISAQUAD) CAPS capsule Take 1 capsule by mouth daily.  Marland Kitchen amLODipine (NORVASC) 5 MG tablet Take 5 mg by mouth daily.  . Calcium Carbonate-Vitamin D3 (CALCIUM 600-D) 600-400 MG-UNIT TABS Take 1 tablet by mouth 2 (two) times daily with breakfast and lunch.  . denosumab (PROLIA) 60 MG/ML SOLN injection Inject 60 mg into the skin every 6 (six) months.  . loratadine (CLARITIN) 10 MG tablet Take 10 mg by mouth daily.  Marland Kitchen LORazepam (ATIVAN) 0.5 MG tablet Take 0.5 mg by mouth every evening. Pt takes this dose at 5pm.  . LORazepam (ATIVAN) 1 MG tablet Take 1 mg by mouth 2 (two) times daily at 8 am and 10 pm.  . mirtazapine (REMERON) 45 MG tablet Take 45 mg by mouth at bedtime.   . Multiple Vitamin (MULTIVITAMIN WITH MINERALS) TABS tablet Take 1 tablet by mouth daily.  . quinapril (ACCUPRIL) 40 MG tablet Take 40 mg by mouth Daily.   . SUMAtriptan (IMITREX) 25 MG tablet Take 25 mg by mouth every 2 (two) hours as needed for migraine.   Marland Kitchen zolpidem (AMBIEN) 5 MG tablet Take 1 tablet (5 mg total) by mouth at bedtime as needed for sleep.   No facility-administered encounter medications on file as of 12/15/2016.      Patient Active Problem List   Diagnosis Date Noted  . Diarrhea of presumed infectious origin   . Diarrhea 09/05/2016  . Depression 09/04/2016  . Hypokalemia  09/04/2016  . Recurrent colitis due to Clostridium difficile 05/11/2016  . Essential hypertension 05/11/2016  . Anxiety state 05/11/2016  . Recurrent Clostridium difficile diarrhea   . Clostridium difficile infection 03/27/2016  . Antibiotic enterocolitis 03/25/2016  . Migraine 02/28/2013  . Hemorrhoids, external, thrombosed 11/05/2011     Health Maintenance Due  Topic Date Due  . TETANUS/TDAP  06/07/1951  . DEXA SCAN  06/06/1997  . PNA vac Low Risk Adult (1 of 2 - PCV13) 06/06/1997    Social History  Substance Use Topics  . Smoking status: Never Smoker  . Smokeless tobacco: Never Used  . Alcohol use 0.0 oz/week     Comment: occasional glass of wine -2 per week   Review of Systems occ loose stools, but different from prior cdiff. 12 point ros is negative Physical Exam   BP 138/74   Pulse 73   Temp 97.5 F (36.4 C) (Oral)   Wt 100 lb (45.4 kg)   BMI 18.59 kg/m    I did not examine CBC Lab Results  Component Value Date   WBC 6.7 09/08/2016   RBC 4.01 09/08/2016   HGB 12.6 09/08/2016   HCT 36.4 09/08/2016   PLT 256 09/08/2016   MCV 90.8 09/08/2016   MCH 31.4 09/08/2016   MCHC 34.6 09/08/2016   RDW 12.5 09/08/2016  LYMPHSABS 2.8 09/08/2016   MONOABS 0.8 09/08/2016   EOSABS 0.3 09/08/2016    BMET Lab Results  Component Value Date   NA 145 09/08/2016   K 3.6 09/08/2016   CL 114 (H) 09/08/2016   CO2 24 09/08/2016   GLUCOSE 96 09/08/2016   BUN <5 (L) 09/08/2016   CREATININE 0.65 09/08/2016   CALCIUM 8.6 (L) 09/08/2016   GFRNONAA >60 09/08/2016   GFRAA >60 09/08/2016      Assessment and Plan  Recurrent c.difficile = she is roughly 2 months - 2 days from colonoscopy delivered FMT and doing well. Consider this for now Clinical cure at 2 months  rtc prn

## 2016-12-31 ENCOUNTER — Ambulatory Visit (HOSPITAL_COMMUNITY): Payer: Medicare Other

## 2017-08-20 IMAGING — CT CT ABD-PELV W/ CM
2 of 5 series · 15 of 46 positions shown, 17 images · IV contrast (ISOVUE)
Comparison: None.

CLINICAL DATA: Low back pain and n/v/d starting this morning. Pain
score [DATE].

EXAM:
CT ABDOMEN AND PELVIS WITH CONTRAST
TECHNIQUE: Multidetector CT imaging of the abdomen and pelvis was performed
using the standard protocol following bolus administration of
intravenous contrast.
CONTRAST:  100mL A4F75M-RJJ IOPAMIDOL (A4F75M-RJJ) INJECTION 61%

[Series 2: abd/pel with · axial · 0.62mm/px · z∈[-710,-345]mm · 12 of 85 slices shown, 14 images]
[im 6/85  soft-tissue]
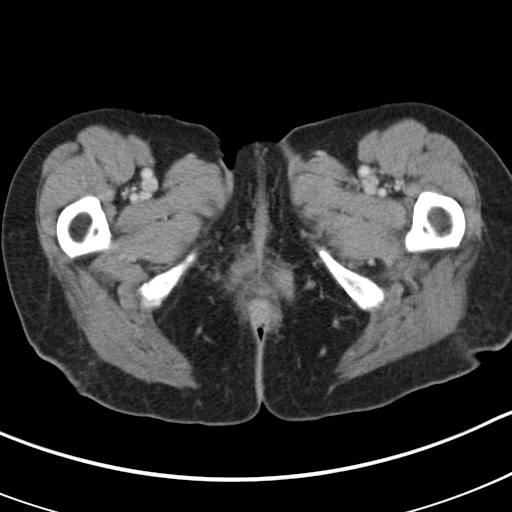
[im 6/85  bone]
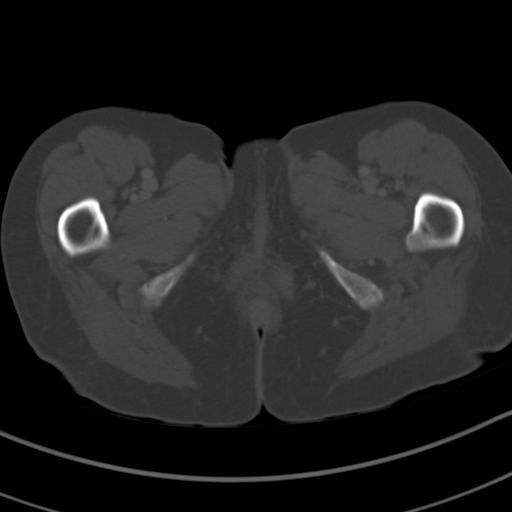
[im 12/85  soft-tissue]
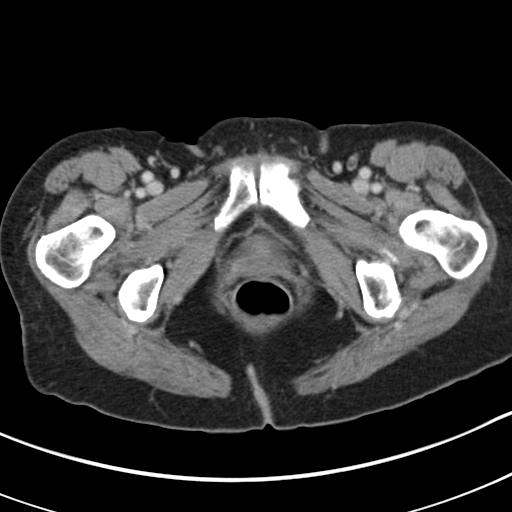
[im 17/85  soft-tissue]
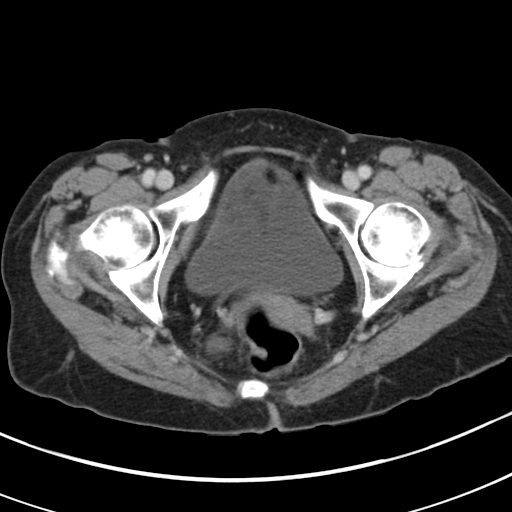
[im 29/85  soft-tissue]
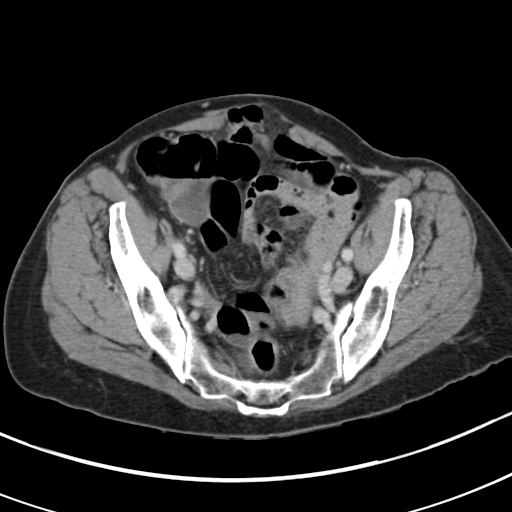
[im 34/85  soft-tissue]
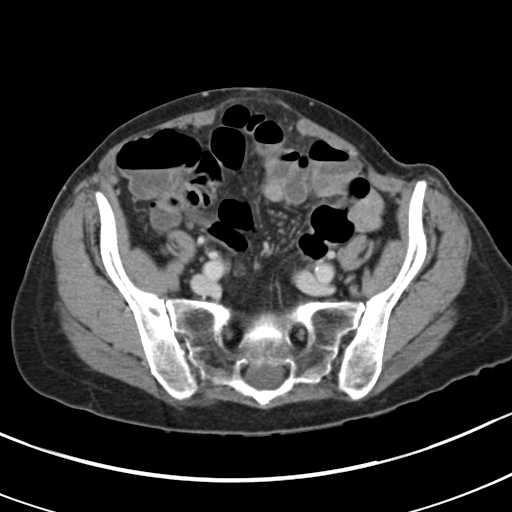
[im 40/85  soft-tissue]
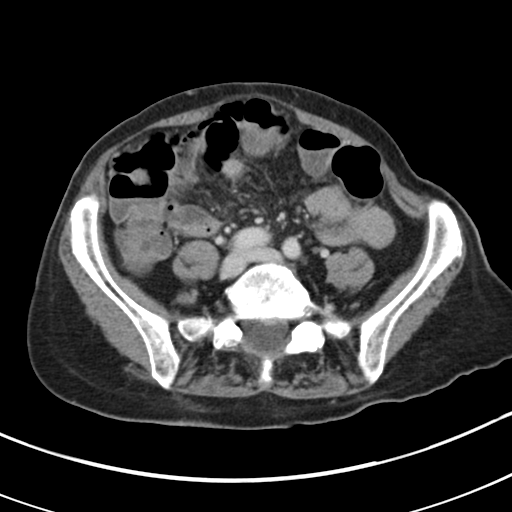
[im 45/85  soft-tissue]
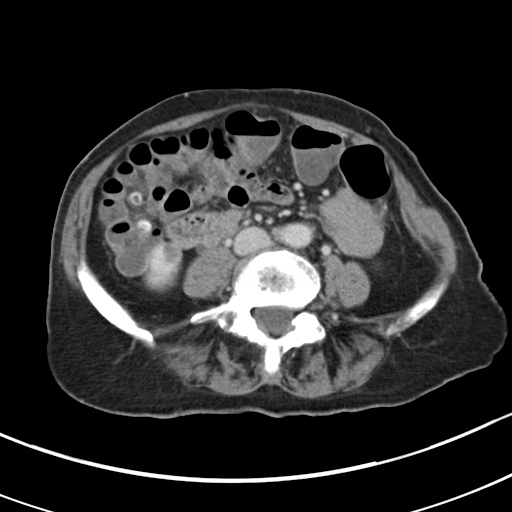
[im 51/85  soft-tissue]
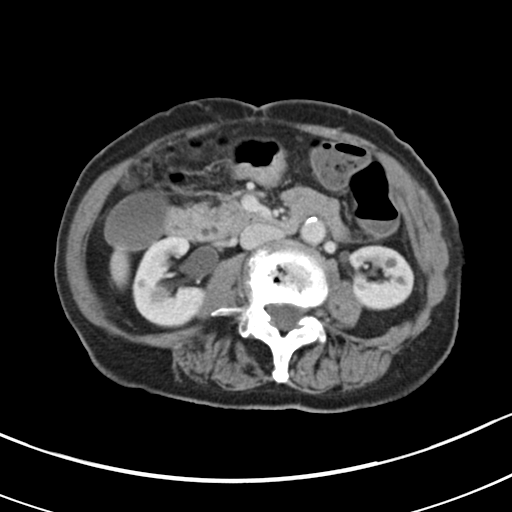
[im 57/85  soft-tissue]
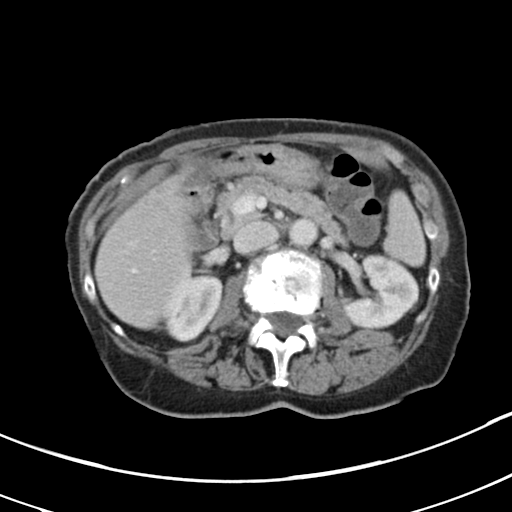
[im 57/85  bone]
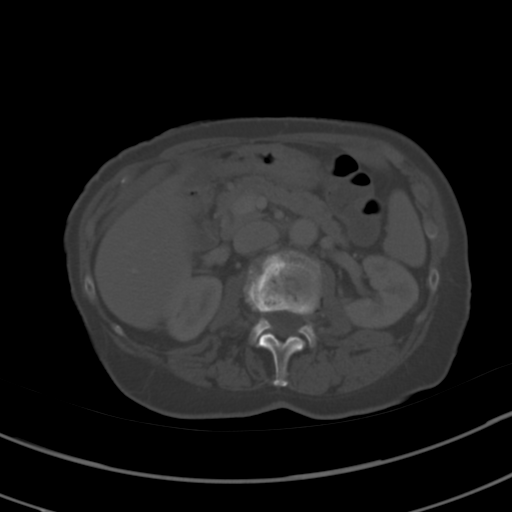
[im 68/85  soft-tissue]
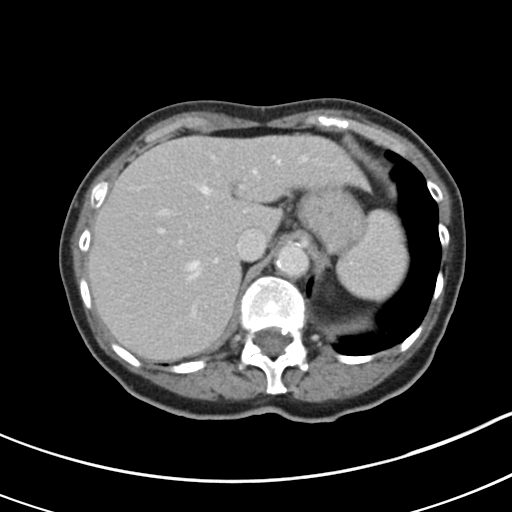
[im 73/85  soft-tissue]
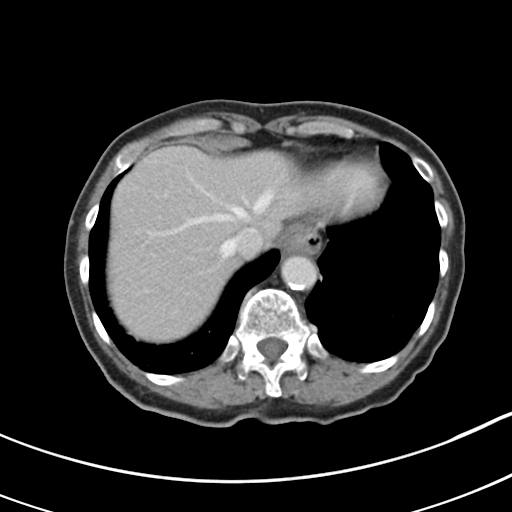
[im 79/85  soft-tissue]
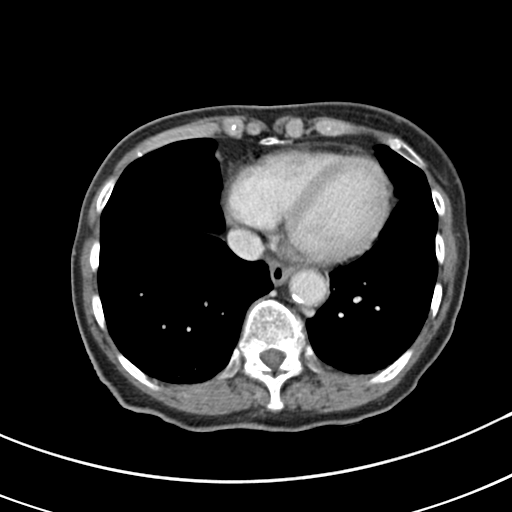

[Series 5: coronal a/|p · coronal · 0.53mm/px · 3 of 105 slices shown]
[im 35/105  soft-tissue]
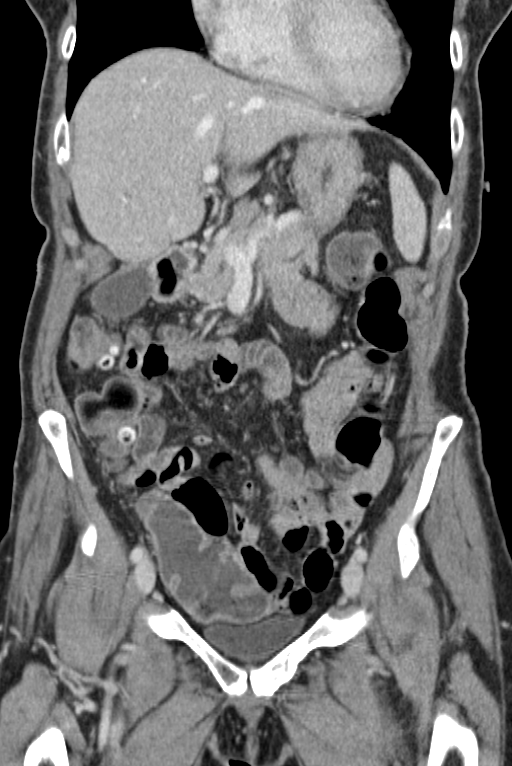
[im 47/105  soft-tissue]
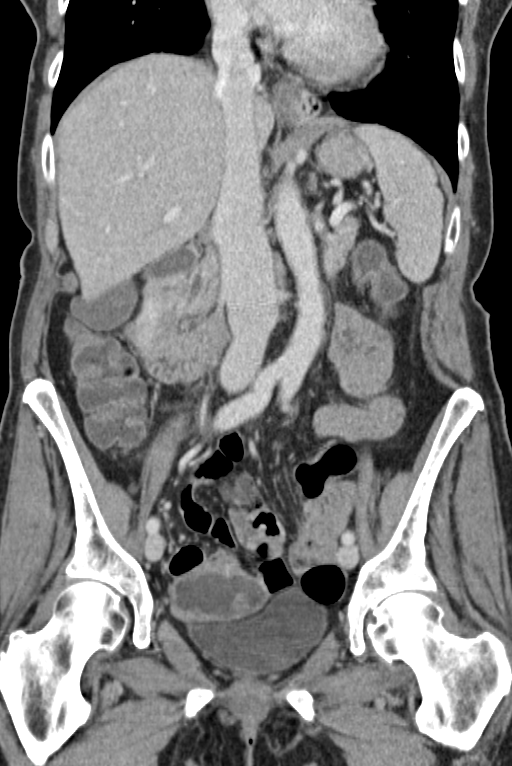
[im 58/105  soft-tissue]
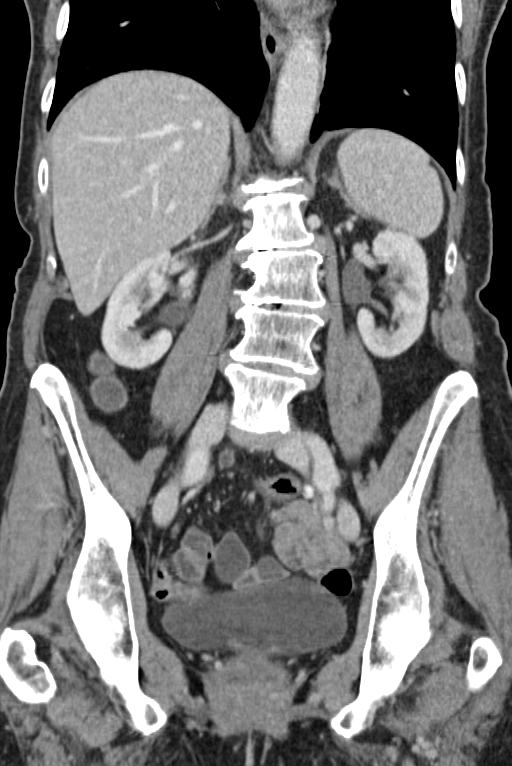

[15 of 46 positions shown; findings below may reference images not displayed]

FINDINGS: Lower chest:  No acute findings.

Hepatobiliary: No masses or other significant abnormality.

Pancreas: No mass, inflammatory changes, or other significant
abnormality.

Spleen: Within normal limits in size and appearance.

Adrenals/Urinary Tract: 17 x 21 mm left adrenal mass. No
urolithiasis or obstructive uropathy. 10 mm hypodense, fluid
attenuating left interpolar renal mass most consistent with a small
cyst. Normal bladder.

Stomach/Bowel: No bowel wall thickening or bowel dilatation.
Multiple air-fluid levels throughout the small bowel and colon. No
pneumatosis, pneumoperitoneum or portal venous gas. Two small
ventral infraumbilical hernia is containing loops of small bowel.

Vascular/Lymphatic: No pathologically enlarged lymph nodes. No
evidence of abdominal aortic aneurysm. Abdominal aortic
atherosclerosis.

Reproductive: No mass or other significant abnormality.

Other: None.

Musculoskeletal: No suspicious bone lesions identified. Degenerative
disc disease throughout the lumbar spine with facet arthropathy.
IMPRESSION: 1. Multiple air-fluid levels throughout the small bowel and colon as
can be seen with enterocolitis likely secondary to an infectious
etiology.
2. Two small ventral infraumbilical hernia is containing loops of
small bowel.
3. Indeterminate 17 x 21 mm left adrenal mass which is unchanged in
size compared with prior radiology report of a CT abdomen dated
01/31/2000.
4.  Aortic Atherosclerosis ([id number]-170.0)

## 2017-10-04 ENCOUNTER — Other Ambulatory Visit (HOSPITAL_COMMUNITY): Payer: Self-pay | Admitting: Internal Medicine

## 2017-10-04 ENCOUNTER — Ambulatory Visit (HOSPITAL_COMMUNITY)
Admission: RE | Admit: 2017-10-04 | Discharge: 2017-10-04 | Disposition: A | Discharge status: Home / Self Care | Payer: Medicare Other | Source: Ambulatory Visit | Attending: Surgery | Admitting: Surgery

## 2017-10-04 DIAGNOSIS — I6529 Occlusion and stenosis of unspecified carotid artery: Secondary | ICD-10-CM | POA: Insufficient documentation

## 2017-10-04 LAB — VAS US CAROTID
LEFT ECA DIAS: -6 cm/s
LEFT VERTEBRAL DIAS: 12 cm/s
LICADSYS: -98 cm/s
LICAPSYS: -83 cm/s
Left CCA dist dias: -12 cm/s
Left CCA dist sys: -70 cm/s
Left CCA prox dias: 21 cm/s
Left CCA prox sys: 138 cm/s
Left ICA dist dias: -25 cm/s
Left ICA prox dias: -15 cm/s

## 2017-12-17 ENCOUNTER — Encounter: Payer: Self-pay | Admitting: Gastroenterology

## 2017-12-20 ENCOUNTER — Ambulatory Visit: Payer: Medicare Other | Admitting: Gastroenterology

## 2017-12-20 ENCOUNTER — Encounter: Payer: Self-pay | Admitting: Gastroenterology

## 2017-12-20 ENCOUNTER — Encounter (INDEPENDENT_AMBULATORY_CARE_PROVIDER_SITE_OTHER): Payer: Self-pay

## 2017-12-20 ENCOUNTER — Other Ambulatory Visit (INDEPENDENT_AMBULATORY_CARE_PROVIDER_SITE_OTHER): Payer: Medicare Other

## 2017-12-20 VITALS — BP 136/80 | HR 80 | Ht 61.5 in | Wt 106.2 lb

## 2017-12-20 DIAGNOSIS — R197 Diarrhea, unspecified: Secondary | ICD-10-CM

## 2017-12-20 DIAGNOSIS — R5383 Other fatigue: Secondary | ICD-10-CM

## 2017-12-20 LAB — TSH: TSH: 3.88 u[IU]/mL (ref 0.35–4.50)

## 2017-12-20 LAB — CBC WITH DIFFERENTIAL/PLATELET
BASOS PCT: 0.7 % (ref 0.0–3.0)
Basophils Absolute: 0 10*3/uL (ref 0.0–0.1)
EOS PCT: 1.7 % (ref 0.0–5.0)
Eosinophils Absolute: 0.1 10*3/uL (ref 0.0–0.7)
HCT: 37.5 % (ref 36.0–46.0)
Hemoglobin: 12.8 g/dL (ref 12.0–15.0)
LYMPHS ABS: 2.7 10*3/uL (ref 0.7–4.0)
Lymphocytes Relative: 40.8 % (ref 12.0–46.0)
MCHC: 34.1 g/dL (ref 30.0–36.0)
MCV: 92.3 fl (ref 78.0–100.0)
MONO ABS: 0.6 10*3/uL (ref 0.1–1.0)
MONOS PCT: 8.3 % (ref 3.0–12.0)
NEUTROS ABS: 3.2 10*3/uL (ref 1.4–7.7)
NEUTROS PCT: 48.5 % (ref 43.0–77.0)
PLATELETS: 290 10*3/uL (ref 150.0–400.0)
RBC: 4.07 Mil/uL (ref 3.87–5.11)
RDW: 12.1 % (ref 11.5–15.5)
WBC: 6.7 10*3/uL (ref 4.0–10.5)

## 2017-12-20 NOTE — Progress Notes (Signed)
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  I she is having intermittent loose stools.  This is not at all like her previous watery diarrhea that she was having with C. difficile.  Instead she will have 2 or 3 semi-formed stools in the morning over the course of an hour or 2.  She will sometimes take 1 or 2 Imodium and this tends to constipate her for 2 or 3 days.  If they aren't traveling or in a strange place her bowels are generally OK.  Never sees blood in her stool.  She is also bothered by extreme fatigue that will often hit her after her lunch meal she says this last 2 or 3 hours.  She tells me she has mentioned this to various physicians and they tell her that it is probably a good time for her to take a nap she is so tired.  Christina Escobar underwent a sigmoid colectomy and incidental appendectomy in July 2001 due to sigmoid diverticulitis with stenosis. She had a colonoscopy in February 2007 by Dr. Timmothy Euler that was a normal colonoscopy to the cecum status post sigmoid resection. In 2014 she was evaluated by Dr. Paulita Fujita for hemorrhoids. She had a colonoscopy by Dr. Paulita Fujita in March 2014. She was noted to have benign colonic mucosa and biopsies were negative for microscopic colitis, active inflammation, granulomas or adenomatous changes. A few medium mouth diverticuli were found in the sigmoid and in the descending colon. She was sent for anorectal manometry that she had performed at [facility]. 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 She was evaluated by Dr. Marcello Moores in April 2014 he was presented with the options of continued medical management with increased fiber, biofeedback therapy, or rectal ultrasound to evaluate for the need injection and sacral nerve stimulators. Biofeedback seemed  to help.  Recurrent C. Difficile colitis, underwent colonoscopy Dr. Ardis Hughs with FMT 10/2016; commercially available fecal material infused in the TI, under the direction of Dr. Graylon Good from [facility].  Follow up with [facility] two months afterwards, stooling much improving, deemed a success.   Weight is up 6 pounds from 10/2016 (same scale here in GI office).    Chief complaint is intermittent loose stools, fatigue  ROS: complete GI ROS as described in HPI, all other review negative.  Constitutional:  No unintentional weight loss   Past Medical History:  Diagnosis Date  . Allergy   . Anal fissure   . Anxiety   . Basal cell carcinoma   . Cataracts, bilateral   . Clostridium difficile diarrhea   . Depression   . Diverticulosis    hx of stricture in colon  . Hemorrhoids   . Hypertension   . IBS (irritable bowel syndrome)   . Insomnia   . Migraine   . Osteopenia   . Thrombosed hemorrhoids     Past Surgical History:  Procedure Laterality Date  . APPENDECTOMY    . BASAL CELL CARCINOMA EXCISION  0932,3557  . CATARACT EXTRACTION, BILATERAL Bilateral 1999  . COLON RESECTION  2001  . COLONOSCOPY WITH PROPOFOL N/A 10/22/2016   Procedure: COLONOSCOPY WITH PROPOFOL;  Surgeon: Milus Banister, MD;  Location: [facility];  Service: Endoscopy;  Laterality: N/A;  . FECAL TRANSPLANT N/A 10/22/2016   Procedure: FECAL TRANSPLANT;  Surgeon: Milus Banister, MD;  Location: [facility];  Service: Endoscopy;  Laterality: N/A;  . INCISION AND DRAINAGE  11/05/2011   thrombosed hemorrhoid  . TRANSANAL EXCISION OF RECTAL MASS WITH HEMORRHOID INJECTION  2007   anal fissure    Current Outpatient Medications  Medication Sig Dispense Refill  . acetaminophen (TYLENOL) 500 MG tablet Take 1,000 mg by mouth 2 (two) times daily.     Marland Kitchen amLODipine (NORVASC) 5 MG tablet Take 5 mg by mouth daily.    . Calcium Carbonate-Vitamin D3 (CALCIUM 600-D) 600-400 MG-UNIT TABS Take 1 tablet by mouth daily.     Marland Kitchen loratadine  (CLARITIN) 10 MG tablet Take 10 mg by mouth daily.    Marland Kitchen LORazepam (ATIVAN) 0.5 MG tablet Take 0.5 mg by mouth every evening. Pt takes this dose at 5pm.    . LORazepam (ATIVAN) 1 MG tablet Take 1 mg by mouth 2 (two) times daily at 8 am and 10 pm.    . mirtazapine (REMERON) 45 MG tablet Take 45 mg by mouth at bedtime.     . Multiple Vitamin (MULTIVITAMIN WITH MINERALS) TABS tablet Take 1 tablet by mouth daily.    . quinapril (ACCUPRIL) 40 MG tablet Take 40 mg by mouth Daily.     . SUMAtriptan (IMITREX) 25 MG tablet Take 25 mg by mouth every 2 (two) hours as needed for migraine.     Marland Kitchen zolpidem (AMBIEN) 5 MG tablet Take 1 tablet (5 mg total) by mouth at bedtime as needed for sleep.     No current facility-administered medications for this visit.     Allergies as of 12/20/2017 - Review Complete 12/20/2017  Allergen Reaction Noted  . Erythromycin Nausea And Vomiting 12/05/2014  . Flagyl [metronidazole] Diarrhea 02/28/2013  . Meloxicam Other (See Comments) 04/18/2016  . Other  09/05/2016    Family History  Problem Relation Age of Onset  . Alzheimer's disease Mother   . Heart failure Mother   . Heart disease Father   . Heart failure Father   . Other Maternal Grandfather        cerebral hemorrhage  . Skin cancer Brother   . Heart disease Brother     Social History   Socioeconomic History  . Marital status: Married    Spouse name: Paediatric nurse  . Number of children: 2  . Years of education: MA  . Highest education level: Not on file  Occupational History  . Occupation: retired Tour manager  . Financial resource strain: Not on file  . Food insecurity:    Worry: Not on file    Inability: Not on file  . Transportation needs:    Medical: Not on file    Non-medical: Not on file  Tobacco Use  . Smoking status: Never Smoker  . Smokeless tobacco: Never Used  Substance and Sexual Activity  . Alcohol use: Yes    Alcohol/week: 0.0 oz    Comment: occasional glass of wine -2 per  week  . Drug use: No  . Sexual activity: Not on file  Lifestyle  . Physical activity:    Days per week: Not on file    Minutes per session: Not on file  . Stress: Not on file  Relationships  . Social connections:    Talks on phone: Not on file    Gets together: Not on file    Attends religious service: Not on file    Active member of club or organization: Not on file  Attends meetings of clubs or organizations: Not on file    Relationship status: Not on file  . Intimate partner violence:    Fear of current or ex partner: Not on file    Emotionally abused: Not on file    Physically abused: Not on file    Forced sexual activity: Not on file  Other Topics Concern  . Not on file  Social History Narrative   Pt lives at home with her Spouse. She has 3 children, (1 adopted). Spouse is a retired Film/video editor.   Caffeine Use: 1-2 cups daily.     Physical Exam: BP 136/80 (BP Location: Left Arm, Patient Position: Sitting, Cuff Size: Normal)   Pulse 80 Comment: irregular  Ht 5' 1.5" (1.562 m)   Wt 106 lb 4 oz (48.2 kg)   BMI 19.75 kg/m  Constitutional: generally well-appearing Psychiatric: alert and oriented x3 Abdomen: soft, nontender, nondistended, no obvious ascites, no peritoneal signs, normal bowel sounds No peripheral edema noted in lower extremities  Assessment and plan: 84 y.o. female with intermittent loose stools, fatigue  First she is very clear that this is not at all like her previous Clostridium difficile infections.  It seems sounds like she is having some alternating stools.  Sometimes chasing the loose stools with Imodium leading to further constipation.  Going to try to even out her bowels with fiber supplement.  I recommended Citrucel to be taken once daily.  I instructed her to call my office in 4 or 5 weeks to report on her response to this.  She will also have basic set of labs today including a CBC and TSH to workup her significant fatigue that she is having  after her lunch meals.  Please see the "Patient Instructions" section for addition details about the plan.  Owens Loffler, MD [facility] 12/20/2017, 1:47 PM

## 2017-12-20 NOTE — Patient Instructions (Addendum)
Please start taking citrucel (orange flavored) powder fiber supplement.  This may cause some bloating at first but that usually goes away. Begin with a small spoonful and work your way up to a large, heaping spoonful daily over a week. Call in 4-5 weeks to report on your response to the fiber. You will have labs checked today in the basement lab.  Please head down after you check out with the front desk  (cbc, tsh).  Normal BMI (Body Mass Index- based on height and weight) is between 23 and 30. Your BMI today is Body mass index is 19.75 kg/m. Marland Kitchen Please consider follow up  regarding your BMI with your Primary Care Provider.

## 2018-01-31 ENCOUNTER — Telehealth: Payer: Self-pay | Admitting: Gastroenterology

## 2018-01-31 NOTE — Telephone Encounter (Signed)
FYI:  The pt has called to update on her condition.  The pt has 2 teaspoons of Citrucel at bedtime, diarrhea has resolved. The pain with BM has also resolved.  She continues to have 3-4 BM daily.  Stools are small, not hard and not loose.  She states it is more normal for her.

## 2018-01-31 NOTE — Telephone Encounter (Signed)
Pt needs a call back to inform about her progress taking citrucel.

## 2018-02-01 NOTE — Telephone Encounter (Signed)
Good to hear, thanks 

## 2018-06-02 ENCOUNTER — Other Ambulatory Visit (INDEPENDENT_AMBULATORY_CARE_PROVIDER_SITE_OTHER): Payer: Medicare Other

## 2018-06-02 ENCOUNTER — Encounter: Payer: Self-pay | Admitting: Gastroenterology

## 2018-06-02 ENCOUNTER — Ambulatory Visit: Payer: Medicare Other | Admitting: Gastroenterology

## 2018-06-02 VITALS — BP 130/70 | HR 96 | Ht 61.5 in | Wt 109.4 lb

## 2018-06-02 DIAGNOSIS — R1084 Generalized abdominal pain: Secondary | ICD-10-CM | POA: Diagnosis not present

## 2018-06-02 DIAGNOSIS — M545 Low back pain, unspecified: Secondary | ICD-10-CM | POA: Insufficient documentation

## 2018-06-02 LAB — URINALYSIS, ROUTINE W REFLEX MICROSCOPIC
Bilirubin Urine: NEGATIVE
Hgb urine dipstick: NEGATIVE
Ketones, ur: NEGATIVE
Leukocytes, UA: NEGATIVE
Nitrite: NEGATIVE
SPECIFIC GRAVITY, URINE: 1.01 (ref 1.000–1.030)
Total Protein, Urine: NEGATIVE
Urine Glucose: NEGATIVE
Urobilinogen, UA: 0.2 (ref 0.0–1.0)
pH: 6 (ref 5.0–8.0)

## 2018-06-02 LAB — BASIC METABOLIC PANEL
BUN: 16 mg/dL (ref 6–23)
CHLORIDE: 101 meq/L (ref 96–112)
CO2: 29 mEq/L (ref 19–32)
CREATININE: 0.97 mg/dL (ref 0.40–1.20)
Calcium: 10.1 mg/dL (ref 8.4–10.5)
GFR: 57.87 mL/min — ABNORMAL LOW (ref >60.00)
GLUCOSE: 103 mg/dL — AB (ref 70–99)
Potassium: 3.8 mEq/L (ref 3.5–5.1)
Sodium: 139 mEq/L (ref 135–145)

## 2018-06-02 NOTE — Patient Instructions (Addendum)
Your provided has ordered labs and a urine test which you have already gone to the lab to have these tests done. We will get back to you with the results.   Normal BMI (Body Mass Index- based on height and weight) is between 23 and 30. Your BMI today is Body mass index is 20.33 kg/m. Marland Kitchen Please consider follow up  regarding your BMI with your Primary Care Provider.   You have been scheduled for a CT scan of the abdomen and pelvis ([facility]). [place]. [place], [place].   You are scheduled on Friday 06-10-2018 at 10:30 am. You should arrive at 10:15  am to your appointment time for registration. Please follow the written instructions below on the day of your exam:  WARNING: IF YOU ARE ALLERGIC TO IODINE/X-RAY DYE, PLEASE NOTIFY RADIOLOGY IMMEDIATELY AT [phone]! YOU WILL BE GIVEN A 13 HOUR PREMEDICATION PREP.  1) Do not eat  anything after 6:30 am (4 hours prior to your test) 2) You have been given 2 bottles of oral contrast to drink. The solution may taste better if refrigerated, but do NOT add ice or any other liquid to this solution. Shake well before drinking.    Drink 1 bottle of contrast @ 8:30 am (2 hours prior to your exam)  Drink 1 bottle of contrast @ 9:30 am (1 hour prior to your exam)  You may take any medications as prescribed with a small amount of water except for the following: Metformin, Glucophage, Glucovance, Avandamet, Riomet, Fortamet, Actoplus Met, Janumet, Glumetza or Metaglip. The above medications must be held the day of the exam AND 48 hours after the exam.  The purpose of you drinking the oral contrast is to aid in the visualization of your intestinal tract. The contrast solution may cause some diarrhea. Before your exam is started, you will be given a small amount of fluid to drink. Depending on your individual set of symptoms, you may also receive an intravenous injection of x-ray contrast/dye. Plan on being at [facility] for  30 minutes or long, depending on the type of exam you are having performed.  If you have any questions regarding your exam or if you need to reschedule, you may call the CT department at [phone] between the hours of 8:00 am and 5:00 pm, Monday-Friday.  ________________________________________________________________________

## 2018-06-02 NOTE — Progress Notes (Signed)
06/02/2018 Christina Escobar [id number] [birth year]   HISTORY OF PRESENT ILLNESS:  This is a pleasant 84 year old female who is a patient of Dr. Ardis Hughs.  She has history of C. difficile requiring fecal transplant last year.  Since then she has been doing well, but was still having some stool frequency/incomplete evacuation.  Dr. Ardis Hughs had recommended Citrucel to help create bulk in her stools.  She is currently using 3.5 teaspoons daily.  She tried to increase to 4 teaspoons daily on one occasion, but then got some loose stools, which frightened her so she went back down to 3.5 teaspoons daily.  She presents here today complaining of lower abdominal discomfort and lower back pain that started bothering her in August.  She says she has never had back issues in the past.  She has noticed increased urinary frequency particularly at nighttime as well.  She feels that she is moving her bowels fairly adequately.  These pains have been preventing her from doing her normal daily routine.   Past Medical History:  Diagnosis Date  . Allergy   . Anal fissure   . Anxiety   . Basal cell carcinoma   . Cataracts, bilateral   . Clostridium difficile diarrhea   . Depression   . Diverticulosis    hx of stricture in colon  . Hemorrhoids   . Hypertension   . IBS (irritable bowel syndrome)   . Insomnia   . Migraine   . Osteopenia   . Thrombosed hemorrhoids    Past Surgical History:  Procedure Laterality Date  . APPENDECTOMY    . BASAL CELL CARCINOMA EXCISION  [id number]  . CATARACT EXTRACTION, BILATERAL Bilateral 1999  . COLON RESECTION  2001  . COLONOSCOPY WITH PROPOFOL N/A 10/22/2016   Procedure: COLONOSCOPY WITH PROPOFOL;  Surgeon: Milus Banister, MD;  Location: [facility];  Service: Endoscopy;  Laterality: N/A;  . FECAL TRANSPLANT N/A 10/22/2016   Procedure: FECAL TRANSPLANT;  Surgeon: Milus Banister, MD;  Location: [facility];  Service: Endoscopy;  Laterality: N/A;  . INCISION AND DRAINAGE   11/05/2011   thrombosed hemorrhoid  . TRANSANAL EXCISION OF RECTAL MASS WITH HEMORRHOID INJECTION  2007   anal fissure    reports that she has never smoked. She has never used smokeless tobacco. She reports that she drinks alcohol. She reports that she does not use drugs. family history includes Alzheimer's disease in her mother; Heart disease in her brother and father; Heart failure in her father and mother; Other in her maternal grandfather; Skin cancer in her brother. Allergies  Allergen Reactions  . Cefdinir     Caused C-Diff  . Erythromycin Nausea And Vomiting  . Flagyl [Metronidazole] Diarrhea  . Meloxicam Other (See Comments)    Reaction:  Dizziness       Outpatient Encounter Medications as of 06/02/2018  Medication Sig  . acetaminophen (TYLENOL) 500 MG tablet Take 1,000 mg by mouth 2 (two) times daily.   Marland Kitchen amLODipine (NORVASC) 5 MG tablet Take 5 mg by mouth daily.  . Calcium Carbonate-Vitamin D3 (CALCIUM 600-D) 600-400 MG-UNIT TABS Take 1 tablet by mouth daily.   Marland Kitchen loratadine (CLARITIN) 10 MG tablet Take 10 mg by mouth daily.  Marland Kitchen LORazepam (ATIVAN) 0.5 MG tablet Take 0.5 mg by mouth every evening. Pt takes this dose at 5pm.  . LORazepam (ATIVAN) 1 MG tablet Take 1 mg by mouth 2 (two) times daily at 8 am and 10 pm.  . mirtazapine (REMERON)  45 MG tablet Take 45 mg by mouth at bedtime.   . Multiple Vitamin (MULTIVITAMIN WITH MINERALS) TABS tablet Take 1 tablet by mouth daily.  . quinapril (ACCUPRIL) 40 MG tablet Take 40 mg by mouth Daily.   . SUMAtriptan (IMITREX) 25 MG tablet Take 25 mg by mouth every 2 (two) hours as needed for migraine.   Marland Kitchen zolpidem (AMBIEN) 5 MG tablet Take 1 tablet (5 mg total) by mouth at bedtime as needed for sleep.   No facility-administered encounter medications on file as of 06/02/2018.      REVIEW OF SYSTEMS  : All other systems reviewed and negative except where noted in the History of Present Illness.   PHYSICAL EXAM: BP 130/70 (BP Location:  Left Arm, Patient Position: Sitting, Cuff Size: Normal)   Pulse 96   Ht 5' 1.5" (1.562 m)   Wt 109 lb 6 oz (49.6 kg)   BMI 20.33 kg/m  General: Well developed white female in no acute distress Head: Normocephalic and atraumatic Eyes:  Sclerae anicteric, conjunctiva pink. Ears: Normal auditory acuity Lungs: Clear throughout to auscultation; no increased WOB. Heart: Regular rate and rhythm; no M/R/G. Abdomen: Soft, non-distended.  BS present.  Mild lower abdominal TTP. Musculoskeletal: Symmetrical with no gross deformities  Skin: No lesions on visible extremities Extremities: No edema  Neurological: Alert oriented x 4, grossly non-focal Psychological:  Alert and cooperative. Normal mood and affect  ASSESSMENT AND PLAN: *84 year old female with complaints of lower abdominal/back pain and also some urinary frequency especially at night for the past month or so.  She has never had back issues in the past.  She seems to be moving her bowels well overall.  Will try to increase her citrucel to 4 tsp daily.  will check a U/A.  May need to see urology as well, but I am going to get a CT scan abdomen and pelvis with contrast first.     CC:  Tisovec, Fransico Him, MD

## 2018-06-03 NOTE — Progress Notes (Signed)
I agree with the above note, plan 

## 2018-06-10 ENCOUNTER — Ambulatory Visit (INDEPENDENT_AMBULATORY_CARE_PROVIDER_SITE_OTHER)
Admission: RE | Admit: 2018-06-10 | Discharge: 2018-06-10 | Disposition: A | Discharge status: Home / Self Care | Payer: Medicare Other | Source: Ambulatory Visit | Attending: Gastroenterology | Admitting: Gastroenterology

## 2018-06-10 DIAGNOSIS — M545 Low back pain: Secondary | ICD-10-CM

## 2018-06-10 DIAGNOSIS — R1084 Generalized abdominal pain: Secondary | ICD-10-CM | POA: Diagnosis not present

## 2018-06-10 MED ORDER — IOPAMIDOL (ISOVUE-300) INJECTION 61%
100.0000 mL | Freq: Once | INTRAVENOUS | Status: AC | PRN
  Administered 2018-06-10: 80 mL via INTRAVENOUS

## 2018-06-20 ENCOUNTER — Telehealth: Payer: Self-pay | Admitting: Gastroenterology

## 2018-06-20 NOTE — Telephone Encounter (Signed)
Patient is requesting a copy of the CT report be sent to her.  She is advised I will get that in the mail to her today.  She will call back for any questions or concerns.

## 2018-08-24 ENCOUNTER — Encounter: Payer: Self-pay | Admitting: Emergency Medicine

## 2018-08-24 DIAGNOSIS — F411 Generalized anxiety disorder: Secondary | ICD-10-CM | POA: Insufficient documentation

## 2018-08-31 ENCOUNTER — Ambulatory Visit (INDEPENDENT_AMBULATORY_CARE_PROVIDER_SITE_OTHER): Payer: Medicare Other | Admitting: Psychiatry

## 2018-08-31 ENCOUNTER — Encounter: Payer: Self-pay | Admitting: Psychiatry

## 2018-08-31 DIAGNOSIS — F5105 Insomnia due to other mental disorder: Secondary | ICD-10-CM | POA: Diagnosis not present

## 2018-08-31 DIAGNOSIS — F411 Generalized anxiety disorder: Secondary | ICD-10-CM

## 2018-08-31 MED ORDER — LORAZEPAM 0.5 MG PO TABS: 0.5000 mg | ORAL_TABLET | Freq: Every evening | ORAL | 5 refills | Status: DC

## 2018-08-31 MED ORDER — MIRTAZAPINE 45 MG PO TABS: 45.0000 mg | ORAL_TABLET | Freq: Every day | ORAL | 11 refills | Status: DC

## 2018-08-31 MED ORDER — LORAZEPAM 1 MG PO TABS: 1.0000 mg | ORAL_TABLET | Freq: Two times a day (BID) | ORAL | 5 refills | Status: DC

## 2018-08-31 MED ORDER — ZOLPIDEM TARTRATE 5 MG PO TABS: 5.0000 mg | ORAL_TABLET | Freq: Every evening | ORAL | 0 refills | Status: DC | PRN

## 2018-08-31 NOTE — Progress Notes (Signed)
Christina Escobar [id number] [birth year] 84 y.o.  Subjective:   Patient ID:  Christina Escobar is a 84 y.o. (DOB August 28, 1930) female.  Chief Complaint:  Chief Complaint  Patient presents with  . Follow-up    Medication Mangement  . Anxiety    HPI Christina Escobar presents to the office today for follow-up of chronic anxiety.  CO getting physically weaker and H is also.  H needs walker and that's limited activity.  They think it's much worse since July.  [name] had 2 meds eliminated and his voice and strength is better.  That has helped bc she's felt scared about his decline in strength.  She feels much more tired than usual and limits activity..  Tiredness worse from noon until about 4 or so.  Patient reports stable mood and denies depressed or irritable moods.  Patient reports anxiety is fairly chronic and stable but situationally worse.  Patient report episodic difficulty with sleep initiation or maintenance.  Seems like Ambien is less effective than in the past.  Gets really keyed up if she has activities at night.  Worries if has activity the next day. Denies appetite disturbance.  Patient reports that energy and motivation have been good.  Patient denies any difficulty with concentration.  Patient denies any suicidal ideation.  Ambien used rarely. Not used #30 since Feb.  Review of Systems:  Review of Systems  Constitutional: Positive for fatigue.  Gastrointestinal: Positive for abdominal pain.       Frequent BM  Neurological: Positive for tremors. Negative for weakness.  Psychiatric/Behavioral: Positive for sleep disturbance. Negative for agitation, behavioral problems, confusion, decreased concentration, dysphoric mood, hallucinations, self-injury and suicidal ideas. The patient is nervous/anxious. The patient is not hyperactive.     Medications: I have reviewed the patient's current medications.  Current Outpatient Medications  Medication Sig Dispense Refill  .  acetaminophen (TYLENOL) 500 MG tablet Take 1,000 mg by mouth 2 (two) times daily.     Marland Kitchen amLODipine (NORVASC) 5 MG tablet Take 5 mg by mouth daily.    . Calcium Carbonate-Vitamin D3 (CALCIUM 600-D) 600-400 MG-UNIT TABS Take 1 tablet by mouth daily.     Marland Kitchen loratadine (CLARITIN) 10 MG tablet Take 10 mg by mouth daily.    Marland Kitchen LORazepam (ATIVAN) 0.5 MG tablet Take 0.5 mg by mouth every evening. Pt takes this dose at 5pm.    . LORazepam (ATIVAN) 1 MG tablet Take 1 mg by mouth 2 (two) times daily at 8 am and 10 pm.    . mirtazapine (REMERON) 45 MG tablet Take 45 mg by mouth at bedtime.     . Multiple Vitamin (MULTIVITAMIN WITH MINERALS) TABS tablet Take 1 tablet by mouth daily.    . quinapril (ACCUPRIL) 40 MG tablet Take 40 mg by mouth Daily.     . SUMAtriptan (IMITREX) 25 MG tablet Take 25 mg by mouth every 2 (two) hours as needed for migraine.     Marland Kitchen zolpidem (AMBIEN) 5 MG tablet Take 1 tablet (5 mg total) by mouth at bedtime as needed for sleep.     No current facility-administered medications for this visit.     Medication Side Effects: Other: ? sedation related.  Allergies:  Allergies  Allergen Reactions  . Cefdinir     Caused C-Diff  . Erythromycin Nausea And Vomiting  . Flagyl [Metronidazole] Diarrhea  . Meloxicam Other (See Comments)    Reaction:  Dizziness     Past Medical History:  Diagnosis Date  .  Allergy   . Anal fissure   . Anxiety   . Basal cell carcinoma   . Cataracts, bilateral   . Clostridium difficile diarrhea   . Depression   . Diverticulosis    hx of stricture in colon  . Hemorrhoids   . Hypertension   . IBS (irritable bowel syndrome)   . Insomnia   . Migraine   . Osteopenia   . Thrombosed hemorrhoids     Family History  Problem Relation Age of Onset  . Alzheimer's disease Mother   . Heart failure Mother   . Heart disease Father   . Heart failure Father   . Other Maternal Grandfather        cerebral hemorrhage  . Skin cancer Brother   . Heart disease  Brother     Social History   Socioeconomic History  . Marital status: Married    Spouse name: Paediatric nurse  . Number of children: 2  . Years of education: MA  . Highest education level: Not on file  Occupational History  . Occupation: retired Tour manager  . Financial resource strain: Not on file  . Food insecurity:    Worry: Not on file    Inability: Not on file  . Transportation needs:    Medical: Not on file    Non-medical: Not on file  Tobacco Use  . Smoking status: Never Smoker  . Smokeless tobacco: Never Used  Substance and Sexual Activity  . Alcohol use: Yes    Alcohol/week: 0.0 standard drinks    Comment: occasional glass of wine -2 per week  . Drug use: No  . Sexual activity: Not on file  Lifestyle  . Physical activity:    Days per week: Not on file    Minutes per session: Not on file  . Stress: Not on file  Relationships  . Social connections:    Talks on phone: Not on file    Gets together: Not on file    Attends religious service: Not on file    Active member of club or organization: Not on file    Attends meetings of clubs or organizations: Not on file    Relationship status: Not on file  . Intimate partner violence:    Fear of current or ex partner: Not on file    Emotionally abused: Not on file    Physically abused: Not on file    Forced sexual activity: Not on file  Other Topics Concern  . Not on file  Social History Narrative   Pt lives at home with her Spouse. She has 3 children, (1 adopted). Spouse is a retired Film/video editor.   Caffeine Use: 1-2 cups daily.    Past Medical History, Surgical history, Social history, and Family history were reviewed and updated as appropriate.   Please see review of systems for further details on the patient's review from today.   Objective:   Physical Exam:  There were no vitals taken for this visit.  Physical Exam Constitutional:      Appearance: Normal appearance.  Neurological:     Mental  Status: She is alert.     Motor: No tremor.     Gait: Gait normal.  Psychiatric:        Attention and Perception: Attention and perception normal.        Mood and Affect: Mood is anxious. Mood is not depressed.        Speech: Speech normal. Speech is not slurred.  Behavior: Behavior is cooperative.        Thought Content: Thought content is not delusional. Thought content does not include homicidal or suicidal ideation.        Cognition and Memory: Cognition normal.     Comments: Insight and judgment good. Chronic anxiety mostly at baseline.     Lab Review:     Component Value Date/Time   NA 139 06/02/2018 1432   K 3.8 06/02/2018 1432   CL 101 06/02/2018 1432   CO2 29 06/02/2018 1432   GLUCOSE 103 (H) 06/02/2018 1432   BUN 16 06/02/2018 1432   CREATININE 0.97 06/02/2018 1432   CALCIUM 10.1 06/02/2018 1432   PROT 6.6 09/08/2016 0517   ALBUMIN 3.7 09/08/2016 0517   AST 29 09/08/2016 0517   ALT 27 09/08/2016 0517   ALKPHOS 56 09/08/2016 0517   BILITOT 0.6 09/08/2016 0517   GFRNONAA >60 09/08/2016 0517   GFRAA >60 09/08/2016 0517       Component Value Date/Time   WBC 6.7 12/20/2017 1416   RBC 4.07 12/20/2017 1416   HGB 12.8 12/20/2017 1416   HCT 37.5 12/20/2017 1416   PLT 290.0 12/20/2017 1416   MCV 92.3 12/20/2017 1416   MCH 31.4 09/08/2016 0517   MCHC 34.1 12/20/2017 1416   RDW 12.1 12/20/2017 1416   LYMPHSABS 2.7 12/20/2017 1416   MONOABS 0.6 12/20/2017 1416   EOSABS 0.1 12/20/2017 1416   BASOSABS 0.0 12/20/2017 1416    No results found for: POCLITH, LITHIUM   No results found for: PHENYTOIN, PHENOBARB, VALPROATE, CBMZ   .res Assessment: Plan:    Generalized anxiety disorder  Insomnia due to mental condition   Ms. Withrow has a long history of anxiety which in the past had contributed to unmanageable diarrhea another bowel symptoms including bowel pain.  These symptoms have been much improved with a combination of lorazepam and mirtazapine.  She  has some chronic insomnia which is generally manageable with the medication.  We have been able to reduce the lorazepam some over the last couple of years and she is probably at the lowest tolerated dose and lowest effective dose.  She takes lorazepam 1 mg at 7A and 5p takes 0.5 mg and 1mg  at night.  Timing does not suggest afternoon tiredness is related to lorazepam.  If she misses the med she feels shakey and anxious.  Anxiety worsens her chronic bowel problems. Mother also used to have to take a nap after lunch also.  Disc napping that is normal.  Sleep hygiene disc in detail and esp in relation to napping.  We discussed the short-term risks associated with benzodiazepines including sedation and increased fall risk among others.  Discussed long-term side effect risk including dependence, potential withdrawal symptoms, and the potential eventual dose-related risk of dementia.  She needs the meds and no indication for changes.  Supportive and cognitive therapy techniques around age-related declines that both she and her husband are experiencing and the anxiety that she has had in response.  She appeared encouraged by the end of the session from the counseling techniques.  This appt was 35 mins.  FU 17mos  Lynder Parents, MD, DFAPA   Please see After Visit Summary for patient specific instructions.  No future appointments.  No orders of the defined types were placed in this encounter.     -------------------------------

## 2018-10-31 ENCOUNTER — Other Ambulatory Visit: Payer: Self-pay | Admitting: Psychiatry

## 2018-10-31 ENCOUNTER — Telehealth: Payer: Self-pay | Admitting: Psychiatry

## 2018-10-31 MED ORDER — LORAZEPAM 1 MG PO TABS: 1.0000 mg | ORAL_TABLET | Freq: Two times a day (BID) | ORAL | 5 refills | Status: DC

## 2018-10-31 NOTE — Telephone Encounter (Signed)
Clarify with provider for dosage information.

## 2018-10-31 NOTE — Telephone Encounter (Signed)
Historically the patient has taken lorazepam 1 mg twice daily and lorazepam 0.5 mg 1 every 5 p.m. I sent in the corrected lorazepam 1 mg prescription

## 2018-10-31 NOTE — Progress Notes (Signed)
Lorazepam 1 mg prescription quantity was incorrect listed as #30 when it should be #60.  Patient takes lorazepam 1 mg twice daily, and a separate prescription for lorazepam 0.5 mg tablets 1 every 5 p.m.

## 2018-10-31 NOTE — Telephone Encounter (Signed)
Patient stated there was a mistake on larazapam 1 mg. Patient takes it 2x a day and the script says qty of 30 only give 1/2 of what she's suppose to have it should have been qty of 60 instead of 30 to be sent to [facility] on [address]

## 2018-11-10 ENCOUNTER — Other Ambulatory Visit: Payer: Self-pay

## 2018-11-10 MED ORDER — ZOLPIDEM TARTRATE 5 MG PO TABS: 5.0000 mg | ORAL_TABLET | Freq: Every evening | ORAL | 0 refills | Status: DC | PRN

## 2018-11-10 NOTE — Progress Notes (Signed)
[facility] pharmacy request refill for pt's Zolpidem 5mg  tablets #30, last fill was 11/2017 rx submitted in December 2019 was printed instead of sent to pharmacy.

## 2019-03-01 ENCOUNTER — Ambulatory Visit (INDEPENDENT_AMBULATORY_CARE_PROVIDER_SITE_OTHER): Payer: Medicare Other | Admitting: Psychiatry

## 2019-03-01 ENCOUNTER — Other Ambulatory Visit: Payer: Self-pay

## 2019-03-01 ENCOUNTER — Encounter: Payer: Self-pay | Admitting: Psychiatry

## 2019-03-01 DIAGNOSIS — F411 Generalized anxiety disorder: Secondary | ICD-10-CM | POA: Diagnosis not present

## 2019-03-01 DIAGNOSIS — F5105 Insomnia due to other mental disorder: Secondary | ICD-10-CM

## 2019-03-01 NOTE — Progress Notes (Signed)
Christina Escobar [id number] 1930-08-26 85 y.o.  Subjective:   Patient ID:  Christina Escobar is a 85 y.o. (DOB 1930-02-26) female.  Chief Complaint:  Chief Complaint  Patient presents with  . Follow-up    Medication Management  . Anxiety    Medication Management  . Depression    Medication Management    Anxiety Symptoms include nervous/anxious behavior. Patient reports no confusion, decreased concentration or suicidal ideas.    Depression        Associated symptoms include fatigue.  Associated symptoms include no decreased concentration and no suicidal ideas.  Past medical history includes anxiety.    Christina Escobar presents to the office today for follow-up of chronic anxiety.    QT DT Covid.  Big sadness that [name] moving to [place].    Tired a lot.  Start things she doesn't finsh.  Let things go too much.  Lose interest in completing things.  Does what is essential.  Gained 8# bc she's not picking out her helpings as opposed to the way things were in the past.  Unusual for her.  Food is good.    CO getting physically weaker and H is also.  H needs walker and that's limited activity.  They think it's much worse since July.  [name] had 2 meds eliminated and his voice and strength is better.  That has helped bc she's felt scared about his decline in strength.  She feels much more tired than usual and limits activity..  Tiredness worse from noon until about 4 or so.  Patient reports stable mood and denies depressed or irritable moods.  Patient reports anxiety is fairly chronic and stable but situationally worse.  Patient report episodic difficulty with sleep initiation or maintenance.  Seems like Ambien is less effective than in the past.  Gets really keyed up if she has activities at night.  Occ naps 2 hours and still sleeps at night.  Worries if has activity the next day. Denies appetite disturbance.  Patient reports that energy is less than usual and motivation have been good.   Patient denies any difficulty with concentration.  Patient denies any suicidal ideation.  Ambien used rarely. Not used #30 since Feb.  Youngest son's D is coming to [facility] soon.  Coming from [place].  Past Psychiatric Medication Trials:  Mirtazapine, nefazodone, Ativan, Ambien  Review of Systems:  Review of Systems  Constitutional: Positive for fatigue.  Gastrointestinal: Positive for abdominal pain.       Frequent BM  Neurological: Positive for tremors. Negative for weakness.  Psychiatric/Behavioral: Positive for depression and sleep disturbance. Negative for agitation, behavioral problems, confusion, decreased concentration, dysphoric mood, hallucinations, self-injury and suicidal ideas. The patient is nervous/anxious. The patient is not hyperactive.   GI px managed.  Medications: I have reviewed the patient's current medications.  Current Outpatient Medications  Medication Sig Dispense Refill  . acetaminophen (TYLENOL) 500 MG tablet Take 1,000 mg by mouth 2 (two) times daily.     Marland Kitchen amLODipine (NORVASC) 5 MG tablet Take 5 mg by mouth daily.    . calcium carbonate (TUMS - DOSED IN MG ELEMENTAL CALCIUM) 500 MG chewable tablet Chew 1 tablet by mouth daily.    . Calcium Carbonate-Vitamin D3 (CALCIUM 600-D) 600-400 MG-UNIT TABS Take 1 tablet by mouth daily.     Marland Kitchen loratadine (CLARITIN) 10 MG tablet Take 10 mg by mouth daily.    Marland Kitchen LORazepam (ATIVAN) 0.5 MG tablet Take 1 tablet (0.5 mg total) by  mouth every evening. Pt takes this dose at 5pm. 30 tablet 5  . LORazepam (ATIVAN) 1 MG tablet Take 1 tablet (1 mg total) by mouth 2 (two) times daily at 8 am and 10 pm. 60 tablet 5  . methylcellulose (CITRUCEL) oral powder Take by mouth daily.    . mirtazapine (REMERON) 45 MG tablet Take 1 tablet (45 mg total) by mouth at bedtime. 30 tablet 11  . Multiple Vitamin (MULTIVITAMIN WITH MINERALS) TABS tablet Take 1 tablet by mouth daily.    . quinapril (ACCUPRIL) 40 MG tablet Take 40 mg by mouth Daily.     .  Simethicone (GAS-X PO) Take by mouth.    . SUMAtriptan (IMITREX) 25 MG tablet Take 25 mg by mouth every 2 (two) hours as needed for migraine.     Marland Kitchen zolpidem (AMBIEN) 5 MG tablet Take 1 tablet (5 mg total) by mouth at bedtime as needed for sleep. 30 tablet 0   No current facility-administered medications for this visit.     Medication Side Effects: Other: ? sedation related.  Allergies:  Allergies  Allergen Reactions  . Cefdinir     Caused C-Diff  . Erythromycin Nausea And Vomiting  . Flagyl [Metronidazole] Diarrhea  . Meloxicam Other (See Comments)    Reaction:  Dizziness     Past Medical History:  Diagnosis Date  . Allergy   . Anal fissure   . Anxiety   . Basal cell carcinoma   . Cataracts, bilateral   . Clostridium difficile diarrhea   . Depression   . Diverticulosis    hx of stricture in colon  . Hemorrhoids   . Hypertension   . IBS (irritable bowel syndrome)   . Insomnia   . Migraine   . Osteopenia   . Thrombosed hemorrhoids     Family History  Problem Relation Age of Onset  . Alzheimer's disease Mother   . Heart failure Mother   . Heart disease Father   . Heart failure Father   . Other Maternal Grandfather        cerebral hemorrhage  . Skin cancer Brother   . Heart disease Brother     Social History   Socioeconomic History  . Marital status: Married    Spouse name: Paediatric nurse  . Number of children: 2  . Years of education: MA  . Highest education level: Not on file  Occupational History  . Occupation: retired Tour manager  . Financial resource strain: Not on file  . Food insecurity    Worry: Not on file    Inability: Not on file  . Transportation needs    Medical: Not on file    Non-medical: Not on file  Tobacco Use  . Smoking status: Never Smoker  . Smokeless tobacco: Never Used  Substance and Sexual Activity  . Alcohol use: Yes    Alcohol/week: 0.0 standard drinks    Comment: occasional glass of wine -2 per week  . Drug use: No   . Sexual activity: Not on file  Lifestyle  . Physical activity    Days per week: Not on file    Minutes per session: Not on file  . Stress: Not on file  Relationships  . Social Herbalist on phone: Not on file    Gets together: Not on file    Attends religious service: Not on file    Active member of club or organization: Not on file    Attends meetings  of clubs or organizations: Not on file    Relationship status: Not on file  . Intimate partner violence    Fear of current or ex partner: Not on file    Emotionally abused: Not on file    Physically abused: Not on file    Forced sexual activity: Not on file  Other Topics Concern  . Not on file  Social History Narrative   Pt lives at home with her Spouse. She has 3 children, (1 adopted). Spouse is a retired Film/video editor.   Caffeine Use: 1-2 cups daily.    Past Medical History, Surgical history, Social history, and Family history were reviewed and updated as appropriate.   Please see review of systems for further details on the patient's review from today.   Objective:   Physical Exam:  There were no vitals taken for this visit.  Physical Exam Constitutional:      Appearance: Normal appearance.  Neurological:     Mental Status: She is alert.     Motor: No tremor.     Gait: Gait normal.  Psychiatric:        Attention and Perception: Attention and perception normal.        Mood and Affect: Mood is anxious. Mood is not depressed.        Speech: Speech normal. Speech is not slurred.        Behavior: Behavior is cooperative.        Thought Content: Thought content is not delusional. Thought content does not include homicidal or suicidal ideation.        Cognition and Memory: Cognition normal.     Comments: Insight and judgment good. Chronic anxiety mostly at baseline.     Lab Review:     Component Value Date/Time   NA 139 06/02/2018 1432   K 3.8 06/02/2018 1432   CL 101 06/02/2018 1432   CO2 29  06/02/2018 1432   GLUCOSE 103 (H) 06/02/2018 1432   BUN 16 06/02/2018 1432   CREATININE 0.97 06/02/2018 1432   CALCIUM 10.1 06/02/2018 1432   PROT 6.6 09/08/2016 0517   ALBUMIN 3.7 09/08/2016 0517   AST 29 09/08/2016 0517   ALT 27 09/08/2016 0517   ALKPHOS 56 09/08/2016 0517   BILITOT 0.6 09/08/2016 0517   GFRNONAA >60 09/08/2016 0517   GFRAA >60 09/08/2016 0517       Component Value Date/Time   WBC 6.7 12/20/2017 1416   RBC 4.07 12/20/2017 1416   HGB 12.8 12/20/2017 1416   HCT 37.5 12/20/2017 1416   PLT 290.0 12/20/2017 1416   MCV 92.3 12/20/2017 1416   MCH 31.4 09/08/2016 0517   MCHC 34.1 12/20/2017 1416   RDW 12.1 12/20/2017 1416   LYMPHSABS 2.7 12/20/2017 1416   MONOABS 0.6 12/20/2017 1416   EOSABS 0.1 12/20/2017 1416   BASOSABS 0.0 12/20/2017 1416    No results found for: POCLITH, LITHIUM   No results found for: PHENYTOIN, PHENOBARB, VALPROATE, CBMZ   .res Assessment: Plan:    Susie was seen today for follow-up, anxiety and depression.  Diagnoses and all orders for this visit:  Generalized anxiety disorder  Insomnia due to mental condition   Ms. Damewood has a long history of anxiety which in the past had contributed to unmanageable diarrhea another bowel symptoms including bowel pain.  These symptoms have been much improved with a combination of lorazepam and mirtazapine.  She has some chronic insomnia which is generally manageable with the medication.  We have been  able to reduce the lorazepam some over the last couple of years and she is probably at the lowest tolerated dose and lowest effective dose.  She takes lorazepam 1 mg at 7A and 5p takes 0.5 mg and 1mg  at night.  Timing does not suggest afternoon tiredness is related to lorazepam.  If she misses the med she feels shakey and anxious.  Anxiety worsens her chronic bowel problems. Mother also used to have to take a nap after lunch also.  Disc napping that is normal.  Sleep hygiene disc in detail and  esp in relation to napping.  We discussed the short-term risks associated with benzodiazepines including sedation and increased fall risk among others.  Discussed long-term side effect risk including dependence, potential withdrawal symptoms, and the potential eventual dose-related risk of dementia.  She needs the meds and no indication for changes.  Supportive and cognitive therapy techniques around age-related declines that both she and her husband are experiencing and the anxiety that she has had in response.   This appt was 30 mins.  FU 6 mos  Lynder Parents, MD, DFAPA   Please see After Visit Summary for patient specific instructions.  No future appointments.  No orders of the defined types were placed in this encounter.     -------------------------------

## 2019-03-03 ENCOUNTER — Other Ambulatory Visit: Payer: Self-pay | Admitting: Psychiatry

## 2019-03-03 DIAGNOSIS — F5105 Insomnia due to other mental disorder: Secondary | ICD-10-CM

## 2019-03-03 DIAGNOSIS — F411 Generalized anxiety disorder: Secondary | ICD-10-CM

## 2019-03-03 MED ORDER — LORAZEPAM 0.5 MG PO TABS: 0.5000 mg | ORAL_TABLET | Freq: Every evening | ORAL | 5 refills | Status: DC

## 2019-03-03 MED ORDER — ZOLPIDEM TARTRATE 5 MG PO TABS: 5.0000 mg | ORAL_TABLET | Freq: Every evening | ORAL | 1 refills | Status: DC | PRN

## 2019-03-03 MED ORDER — MIRTAZAPINE 45 MG PO TABS: 45.0000 mg | ORAL_TABLET | Freq: Every day | ORAL | 11 refills | Status: DC

## 2019-03-03 MED ORDER — LORAZEPAM 1 MG PO TABS: 1.0000 mg | ORAL_TABLET | Freq: Two times a day (BID) | ORAL | 5 refills | Status: DC

## 2019-05-09 ENCOUNTER — Encounter: Payer: Self-pay | Admitting: Gastroenterology

## 2019-05-09 ENCOUNTER — Ambulatory Visit (INDEPENDENT_AMBULATORY_CARE_PROVIDER_SITE_OTHER): Payer: Medicare Other | Admitting: Gastroenterology

## 2019-05-09 VITALS — BP 112/70 | HR 83 | Ht 61.0 in | Wt 114.0 lb

## 2019-05-09 DIAGNOSIS — K5900 Constipation, unspecified: Secondary | ICD-10-CM

## 2019-05-09 MED ORDER — LINACLOTIDE 72 MCG PO CAPS: 72.0000 ug | ORAL_CAPSULE | Freq: Every day | ORAL | 6 refills | Status: DC

## 2019-05-09 NOTE — Patient Instructions (Addendum)
If you are age 83 or older, your body mass index should be between 23-30. Your Body mass index is 21.54 kg/m. If this is out of the aforementioned range listed, please consider follow up with your Primary Care Provider.  If you are age 58 or younger, your body mass index should be between 19-25. Your Body mass index is 21.54 kg/m. If this is out of the aformentioned range listed, please consider follow up with your Primary Care Provider.   We have sent the following medications to your pharmacy for you to pick up at your convenience: Linzess 72 mcg: Take once daily before breakfast  Please call in 4 weeks and let us know how you are doing.  Thank you, Owens Loffler, MD

## 2019-05-09 NOTE — Progress Notes (Signed)
Anayelli underwent a sigmoid colectomy and incidental appendectomy in July 2001 due to sigmoid diverticulitis with stenosis. She had a colonoscopy in February 2007 by Dr. Timmothy Euler that was a normal colonoscopy to the cecum status post sigmoid resection. In 2014 she was evaluated by Dr. Paulita Fujita for hemorrhoids. She had a colonoscopy by Dr. Paulita Fujita in March 2014. She was noted to have benign colonic mucosa and biopsies were negative for microscopic colitis, active inflammation, granulomas or adenomatous changes. A few medium mouth diverticuli were found in the sigmoid and in the descending colon. She was sent for anorectal manometry that she had performed at [facility]. 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 She was evaluated by Dr. Marcello Moores in April 2014 he was presented with the options of continued medical management with increased fiber, biofeedback therapy, or rectal ultrasound to evaluate for the need injection and sacral nerve stimulators. Biofeedback seemed to help.  Recurrent C. Difficile colitis, underwent colonoscopy Dr. Ardis Hughs with FMT 10/2016; commercially available fecal material infused in the TI, under the direction of Dr. Graylon Good from [facility].  Follow up with [facility] two months afterwards, stooling much improving, deemed a success.    HPI: This is a very pleasant 85 year old woman who was here in our office last about a year ago and she saw Janett Billow.  Her weight is up 5 pounds since her last office visit here about 1 year ago.  She is having to strain to move her bowels.  She will generally sit on the toilet half an hour first thing in the morning and then another half an hour after breakfast.  Stools can be hard or soft after all that works sometimes.  She does not see any bleeding.  She has no real abdominal pains.  She really thinks that this struggling to move her bowels is leading to low back pains.  She  feels that when she can have a large BM which is rare some bothersome low back pain is usually improved.  She takes a large scoop of Citrucel once daily.  Since she started taking that some previous incontinence which was a big bother to her has completely resolved.  She has been taking it for the past year and a half or so on my advice.  Chief complaint is constipation, low back pain  ROS: complete GI ROS as described in HPI, all other review negative.  Constitutional:  No unintentional weight loss   Past Medical History:  Diagnosis Date  . Allergy   . Anal fissure   . Anxiety   . Basal cell carcinoma   . Cataracts, bilateral   . Clostridium difficile diarrhea   . Depression   . Diverticulosis    hx of stricture in colon  . Hemorrhoids   . Hypertension   . IBS (irritable bowel syndrome)   . Insomnia   . Migraine   . Osteopenia   . Thrombosed hemorrhoids     Past Surgical History:  Procedure Laterality Date  . APPENDECTOMY    . BASAL CELL CARCINOMA EXCISION  [id number]  . CATARACT EXTRACTION, BILATERAL Bilateral 1999  . COLON RESECTION  2001  . COLONOSCOPY WITH PROPOFOL N/A 10/22/2016   Procedure: COLONOSCOPY WITH PROPOFOL;  Surgeon: Milus Banister, MD;  Location: [facility];  Service: Endoscopy;  Laterality: N/A;  . FECAL TRANSPLANT N/A 10/22/2016   Procedure: FECAL TRANSPLANT;  Surgeon: Milus Banister,  MD;  Location: [facility];  Service: Endoscopy;  Laterality: N/A;  . INCISION AND DRAINAGE  11/05/2011   thrombosed hemorrhoid  . TRANSANAL EXCISION OF RECTAL MASS WITH HEMORRHOID INJECTION  2007   anal fissure    Current Outpatient Medications  Medication Sig Dispense Refill  . acetaminophen (TYLENOL) 500 MG tablet Take 1,000 mg by mouth 2 (two) times daily.     Marland Kitchen amLODipine (NORVASC) 5 MG tablet Take 5 mg by mouth daily.    . calcium carbonate (TUMS - DOSED IN MG ELEMENTAL CALCIUM) 500 MG chewable tablet Chew 1 tablet by mouth daily.    . Calcium  Carbonate-Vitamin D3 (CALCIUM 600-D) 600-400 MG-UNIT TABS Take 1 tablet by mouth daily.     Marland Kitchen loratadine (CLARITIN) 10 MG tablet Take 10 mg by mouth daily.    Marland Kitchen LORazepam (ATIVAN) 0.5 MG tablet Take 1 tablet (0.5 mg total) by mouth every evening. Pt takes this dose at 5pm. 30 tablet 5  . LORazepam (ATIVAN) 1 MG tablet Take 1 tablet (1 mg total) by mouth 2 (two) times daily at 8 am and 10 pm. 60 tablet 5  . methylcellulose (CITRUCEL) oral powder Take by mouth daily.    . mirtazapine (REMERON) 45 MG tablet Take 1 tablet (45 mg total) by mouth at bedtime. 30 tablet 11  . Multiple Vitamin (MULTIVITAMIN WITH MINERALS) TABS tablet Take 1 tablet by mouth daily.    . quinapril (ACCUPRIL) 40 MG tablet Take 40 mg by mouth Daily.     . Simethicone (GAS-X PO) Take by mouth.    . SUMAtriptan (IMITREX) 25 MG tablet Take 25 mg by mouth every 2 (two) hours as needed for migraine.     Marland Kitchen zolpidem (AMBIEN) 5 MG tablet Take 1 tablet (5 mg total) by mouth at bedtime as needed for sleep. 30 tablet 1   No current facility-administered medications for this visit.     Allergies as of 05/09/2019 - Review Complete 05/09/2019  Allergen Reaction Noted  . Cefdinir  09/05/1916  . Erythromycin Nausea And Vomiting 12/05/2014  . Flagyl [metronidazole] Diarrhea 02/28/2013  . Meloxicam Other (See Comments) 04/18/2016    Family History  Problem Relation Age of Onset  . Alzheimer's disease Mother   . Heart failure Mother   . Heart disease Father   . Heart failure Father   . Other Maternal Grandfather        cerebral hemorrhage  . Skin cancer Brother   . Heart disease Brother   . Colon cancer Neg Hx   . Stomach cancer Neg Hx   . Pancreatic cancer Neg Hx     Social History   Socioeconomic History  . Marital status: Married    Spouse name: Paediatric nurse  . Number of children: 2  . Years of education: MA  . Highest education level: Not on file  Occupational History  . Occupation: retired Tour manager  .  Financial resource strain: Not on file  . Food insecurity    Worry: Not on file    Inability: Not on file  . Transportation needs    Medical: Not on file    Non-medical: Not on file  Tobacco Use  . Smoking status: Never Smoker  . Smokeless tobacco: Never Used  Substance and Sexual Activity  . Alcohol use: Yes    Alcohol/week: 0.0 standard drinks    Comment: occasional glass of wine -2 per week  . Drug use: No  . Sexual activity: Not on file  Lifestyle  . Physical activity    Days per week: Not on file    Minutes per session: Not on file  . Stress: Not on file  Relationships  . Social Herbalist on phone: Not on file    Gets together: Not on file    Attends religious service: Not on file    Active member of club or organization: Not on file    Attends meetings of clubs or organizations: Not on file    Relationship status: Not on file  . Intimate partner violence    Fear of current or ex partner: Not on file    Emotionally abused: Not on file    Physically abused: Not on file    Forced sexual activity: Not on file  Other Topics Concern  . Not on file  Social History Narrative   Pt lives at home with her Spouse. She has 3 children, (1 adopted). Spouse is a retired Film/video editor.   Caffeine Use: 1-2 cups daily.     Physical Exam: BP 112/70   Pulse 83   Ht 5\' 1"  (1.549 m) Comment: w/ o shoes  Wt 114 lb (51.7 kg)   BMI 21.54 kg/m  Constitutional: Frail elderly woman Psychiatric: alert and oriented x3 Abdomen: soft, nontender, nondistended, no obvious ascites, no peritoneal signs, normal bowel sounds No peripheral edema noted in lower extremities  Assessment and plan: 85 y.o. female with constipation, low back pain  She takes a good dose of powder fiber supplement every day and since she has been taking that incontinence has completely resolved.  She does seem to have some struggle to move her bowels still however.  She will sit on the toilet for half an  hour in the morning and a half an hour after she first eats breakfast.  She believes that this is causing some of her low back pains and she might be correct on that.  Previously MiraLAX led to incontinence.  Going to try her on low-dose Linzess 1 pill once daily in addition to her fiber supplement and she will call in 4 weeks to report on her response.  I see no reason for any further blood tests or imaging studies prior to then.  Please see the "Patient Instructions" section for addition details about the plan.  Owens Loffler, MD [facility] 05/09/2019, 3:32 PM

## 2019-06-06 ENCOUNTER — Telehealth: Payer: Self-pay | Admitting: Gastroenterology

## 2019-06-06 NOTE — Telephone Encounter (Signed)
Pt called to update Dr. Ardis Hughs on her progress. She states that she took Linzess and immediately she began having watery diarrhea for the next two days, she stopped taking it. She states that she goes from diarrhea to constipation randomly, she has been taking citrucel and has been having loose stools but when she is constipated she has low back pain. She would like some advise on what else she can try.

## 2019-06-06 NOTE — Telephone Encounter (Signed)
Dr Ardis Hughs please see the message from the pt and advise

## 2019-06-07 MED ORDER — LUBIPROSTONE 8 MCG PO CAPS: 8.0000 ug | ORAL_CAPSULE | Freq: Every day | ORAL | 3 refills | Status: DC

## 2019-06-07 NOTE — Telephone Encounter (Signed)
I would like her to continue taking Citrucel every day.  I would like her to stop taking the Linzess.  Please instead call in amitiza 8 mcg pills, I want her to take 1 pill once daily, dispense 30 pills with 3 refills.  She should call to report on her response in 4 weeks.  Let her know this is a much lower strength medicine than the Linzess was

## 2019-06-07 NOTE — Telephone Encounter (Signed)
The pt has been advised that she should continue citrucel and stop the linzess.  She is aware to start Amitiza once daily and call in 4 weeks.  The pt has been advised of the information and verbalized understanding.

## 2019-07-07 ENCOUNTER — Telehealth: Payer: Self-pay | Admitting: Gastroenterology

## 2019-07-07 ENCOUNTER — Other Ambulatory Visit: Payer: Self-pay | Admitting: Gastroenterology

## 2019-07-07 MED ORDER — LUBIPROSTONE 8 MCG PO CAPS: 8.0000 ug | ORAL_CAPSULE | Freq: Two times a day (BID) | ORAL | 11 refills | Status: DC

## 2019-07-07 NOTE — Telephone Encounter (Signed)
Dr Ardis Hughs, Patient takes Amitiza 8 mcg daily. She still has constipation. Her PCP told her to increase it to twice daily and has found this to relieve her constipation. Is it okay for her to take twice daily? If so I will send in a new prescription. Please advise

## 2019-07-07 NOTE — Telephone Encounter (Signed)
Pt states that she had been taking Amitiza once a day as Dr. Ardis Hughs indicated but id did not help. Her PCP told her to increase it to BID and it has been helping her. Pt would like to know if Dr. Ardis Hughs is ok with her taking it BID or if he wants pt to take something different. If it is ok to continue with Medication BID pls send prescription to [facility].

## 2019-07-07 NOTE — Telephone Encounter (Signed)
Spoke to patient to let her know that a new prescription for Amitiza 8 mcg twice daily has been sent to her pharmacy. She will give [place] a call in a few weeks with an update on how she is doing.

## 2019-07-07 NOTE — Telephone Encounter (Signed)
Definitely OK.  New script for BID, one motnh with 11 refills.  thanks

## 2019-08-04 ENCOUNTER — Encounter: Payer: Self-pay | Admitting: *Deleted

## 2019-08-04 ENCOUNTER — Telehealth: Payer: Self-pay | Admitting: Gastroenterology

## 2019-08-04 NOTE — Telephone Encounter (Signed)
Spoke to the patient who reports increased diarrhea this week which significantly worsened yesterday. Ms.Pinette reports yesterday intense urgency and fecal incontinence (2 accidents). She reports she is also "having trouble with my back" and says that it is related to her bowels. She states when she is "cleaned out" her back does not hurt. She wanted Dr. Ardis Hughs to be aware that she stopped the Amitiza due the increased diarrhea and accompanying urgency and fecal incontinene. She is wondering if she should go back to only taking citrucel at night due to Linzess and Amitiza inducing diarrhea. Please advise.    Patient requested f/u visit with Dr. Ardis Hughs, scheduled on 09/18/2018 at 2:10 pm.

## 2019-08-04 NOTE — Telephone Encounter (Signed)
Understood.  Yes I think she should resume Citrucel at night.  She should call if she has any further questions or concerns before her upcoming January 5 office visit thank you

## 2019-08-04 NOTE — Telephone Encounter (Signed)
Spoke to patient who has been made aware of Dr. Ardis Hughs recommendations. Patient looking forward to coming in on 1/5. No other questions or concerns voiced by the conclusion of the call.

## 2019-08-04 NOTE — Telephone Encounter (Signed)
Patient calling to let Dr. Ardis Hughs know that she stopped taking amitiza yesterday- had bad diarrhea and had it twice this week and she did not know what to do so she stopped taking it. Also, said that with the symptoms she is having it does seem that her back problems is connected to the bowel problems.

## 2019-09-13 ENCOUNTER — Ambulatory Visit (INDEPENDENT_AMBULATORY_CARE_PROVIDER_SITE_OTHER): Payer: Medicare Other | Admitting: Psychiatry

## 2019-09-13 ENCOUNTER — Encounter: Payer: Self-pay | Admitting: Psychiatry

## 2019-09-13 ENCOUNTER — Other Ambulatory Visit: Payer: Self-pay

## 2019-09-13 DIAGNOSIS — F411 Generalized anxiety disorder: Secondary | ICD-10-CM

## 2019-09-13 DIAGNOSIS — F5105 Insomnia due to other mental disorder: Secondary | ICD-10-CM

## 2019-09-13 MED ORDER — LORAZEPAM 1 MG PO TABS: 1.0000 mg | ORAL_TABLET | Freq: Two times a day (BID) | ORAL | 5 refills | Status: DC

## 2019-09-13 MED ORDER — LORAZEPAM 0.5 MG PO TABS: 0.5000 mg | ORAL_TABLET | Freq: Every evening | ORAL | 5 refills | Status: DC

## 2019-09-13 MED ORDER — ZOLPIDEM TARTRATE 5 MG PO TABS: 5.0000 mg | ORAL_TABLET | Freq: Every evening | ORAL | 1 refills | Status: DC | PRN

## 2019-09-13 NOTE — Progress Notes (Signed)
[name] [id number] May 18, 1930 85 y.o.  Subjective:   Patient ID:  Christina Escobar is a 85 y.o. (DOB Oct 17, 1929) female.  Chief Complaint:  Chief Complaint  Patient presents with  . Follow-up     Medciation Management  . Anxiety     Medciation Management  . Medication Refill    Lorazepam    Anxiety Symptoms include nervous/anxious behavior. Patient reports no confusion, decreased concentration or suicidal ideas.    Depression        Associated symptoms include fatigue.  Associated symptoms include no decreased concentration and no suicidal ideas.  Past medical history includes anxiety.    Christina Escobar presents to the office today for follow-up of chronic anxiety.    Seen June 2020.  No meds were changed.  Not really myself.  Strict lockdown rules where she is.  Not much socially.  Isolated and it's hard.  Hardly know how to carry on a conversation.  Forgetful and procrastinates and down on herself for it.  Christina Escobar gets exhausted trying to do things.  Christina Escobar doesn't hear well.  Stresses.  She doesn't want to drive but thinks she should DT his eyesight.  Christina Escobar complains over her anxity.  Misses family in [place].  Christina Escobar at [facility].  Other Christina Escobar is Christina Escobar.   Vaccines start in 2 weeks.    QT DT Covid.  Big sadness that Christina Escobar Christina Escobar moving to [place].    Tired a lot. Too tired if don't get a nap.  Start things she doesn't finsh.  Let things go too much.  Lose interest in completing things.  Does what is essential.  Gained 8# bc she's not picking out her helpings as opposed to the way things were in the past.  Unusual for her.  Food is good.   Misses family  CO getting physically weaker and H is also.  H needs walker and that's limited activity.  They think it's much worse since July.  Christina Escobar had 2 meds eliminated and his voice and strength is better.  That has helped bc she's felt scared about his decline in strength.  She feels much more tired than usual and limits activity..  Tiredness  worse from noon until about 4 or so.  Patient reports stable mood and denies depressed or irritable moods.  Patient reports anxiety is fairly chronic and stable but situationally worse.  Patient report episodic difficulty with sleep initiation or maintenance.  Seems like Ambien is less effective than in the past.  Gets really keyed up if she has activities at night.  Occ naps 2 hours and still sleeps at night.  Worries if has activity the next day. Denies appetite disturbance.  Patient reports that energy is less than usual and motivation have been good.  Patient denies any difficulty with concentration.  Patient denies any suicidal ideation.  Ambien used rarely. Not used #30 since Feb.  Youngest son's D is coming to [facility] soon.  Coming from Christina Esc[place].  Past Psychiatric Medication Trials:  Mirtazapine, nefazodone, Ativan, Ambien  Review of Systems:  Review of Systems  Constitutional: Positive for fatigue.  Gastrointestinal: Positive for abdominal pain.       Frequent BM  Neurological: Positive for tremors. Negative for weakness.  Psychiatric/Behavioral: Positive for depression and sleep disturbance. Negative for agitation, behavioral problems, confusion, decreased concentration, dysphoric mood, hallucinations, self-injury and suicidal ideas. The patient is nervous/anxious. The patient is not hyperactive.   GI px managed.  Medications: I have reviewed the patient's  current medications.  Current Outpatient Medications  Medication Sig Dispense Refill  . acetaminophen (TYLENOL) 500 MG tablet Take 1,000 mg by mouth 2 (two) times daily.     Marland Kitchen amLODipine (NORVASC) 5 MG tablet Take 5 mg by mouth daily.    . calcium carbonate (TUMS - DOSED IN MG ELEMENTAL CALCIUM) 500 MG chewable tablet Chew 1 tablet by mouth daily.    . Calcium Carbonate-Vitamin D3 (CALCIUM 600-D) 600-400 MG-UNIT TABS Take 1 tablet by mouth daily.     Marland Kitchen loratadine (CLARITIN) 10 MG tablet Take 10 mg by mouth daily.    Marland Kitchen LORazepam  (ATIVAN) 0.5 MG tablet Take 1 tablet (0.5 mg total) by mouth every evening. Pt takes this dose at 5pm. 30 tablet 5  . LORazepam (ATIVAN) 1 MG tablet Take 1 tablet (1 mg total) by mouth 2 (two) times daily at 8 am and 10 pm. 60 tablet 5  . methylcellulose (CITRUCEL) oral powder Take by mouth daily.    . mirtazapine (REMERON) 45 MG tablet Take 1 tablet (45 mg total) by mouth at bedtime. 30 tablet 11  . Multiple Vitamin (MULTIVITAMIN WITH MINERALS) TABS tablet Take 1 tablet by mouth daily.    . quinapril (ACCUPRIL) 40 MG tablet Take 40 mg by mouth Daily.     . Simethicone (GAS-X PO) Take by mouth.    . SUMAtriptan (IMITREX) 25 MG tablet Take 25 mg by mouth every 2 (two) hours as needed for migraine.     Marland Kitchen zolpidem (AMBIEN) 5 MG tablet Take 1 tablet (5 mg total) by mouth at bedtime as needed for sleep. 30 tablet 1   No current facility-administered medications for this visit.    Medication Side Effects: Other: ? sedation related.  Allergies:  Allergies  Allergen Reactions  . Cefdinir     Caused C-Diff  . Erythromycin Nausea And Vomiting  . Flagyl [Metronidazole] Diarrhea  . Meloxicam Other (See Comments)    Reaction:  Dizziness     Past Medical History:  Diagnosis Date  . Allergy   . Anal fissure   . Anxiety   . Basal cell carcinoma   . Cataracts, bilateral   . Clostridium difficile diarrhea   . Depression   . Diverticulosis    hx of stricture in colon  . Hemorrhoids   . Hypertension   . IBS (irritable bowel syndrome)   . Insomnia   . Migraine   . Osteopenia   . Thrombosed hemorrhoids     Family History  Problem Relation Age of Onset  . Alzheimer's disease Mother   . Heart failure Mother   . Heart disease Father   . Heart failure Father   . Other Maternal Grandfather        cerebral hemorrhage  . Skin cancer Brother   . Heart disease Brother   . Colon cancer Neg Hx   . Stomach cancer Neg Hx   . Pancreatic cancer Neg Hx     Social History   Socioeconomic  History  . Marital status: Married    Spouse name: Paediatric nurse  . Number of children: 2  . Years of education: MA  . Highest education level: Not on file  Occupational History  . Occupation: retired Pharmacist, hospital  Tobacco Use  . Smoking status: Never Smoker  . Smokeless tobacco: Never Used  Substance and Sexual Activity  . Alcohol use: Yes    Alcohol/week: 0.0 standard drinks    Comment: occasional glass of wine -2 per week  .  Drug use: No  . Sexual activity: Not on file  Other Topics Concern  . Not on file  Social History Narrative   Pt lives at home with her Spouse. She has 3 children, (1 adopted). Spouse is a retired Film/video editor.   Caffeine Use: 1-2 cups daily.   Social Determinants of Health   Financial Resource Strain:   . Difficulty of Paying Living Expenses: Not on file  Food Insecurity:   . Worried About Charity fundraiser in the Last Year: Not on file  . Ran Out of Food in the Last Year: Not on file  Transportation Needs:   . Lack of Transportation (Medical): Not on file  . Lack of Transportation (Non-Medical): Not on file  Physical Activity:   . Days of Exercise per Week: Not on file  . Minutes of Exercise per Session: Not on file  Stress:   . Feeling of Stress : Not on file  Social Connections:   . Frequency of Communication with Friends and Family: Not on file  . Frequency of Social Gatherings with Friends and Family: Not on file  . Attends Religious Services: Not on file  . Active Member of Clubs or Organizations: Not on file  . Attends Archivist Meetings: Not on file  . Marital Status: Not on file  Intimate Partner Violence:   . Fear of Current or Ex-Partner: Not on file  . Emotionally Abused: Not on file  . Physically Abused: Not on file  . Sexually Abused: Not on file    Past Medical History, Surgical history, Social history, and Family history were reviewed and updated as appropriate.   Please see review of systems for further details on the  patient's review from today.   Objective:   Physical Exam:  There were no vitals taken for this visit.  Physical Exam Constitutional:      Appearance: Normal appearance.  Neurological:     Mental Status: She is alert.     Motor: No tremor.     Gait: Gait normal.  Psychiatric:        Attention and Perception: Attention and perception normal.        Mood and Affect: Mood is anxious. Mood is not depressed.        Speech: Speech normal. Speech is not slurred.        Behavior: Behavior is cooperative.        Thought Content: Thought content is not delusional. Thought content does not include homicidal or suicidal ideation.        Cognition and Memory: Cognition normal.     Comments: Insight and judgment good. Chronic anxiety mostly at baseline.     Lab Review:     Component Value Date/Time   NA 139 06/02/2018 1432   K 3.8 06/02/2018 1432   CL 101 06/02/2018 1432   CO2 29 06/02/2018 1432   GLUCOSE 103 (H) 06/02/2018 1432   BUN 16 06/02/2018 1432   CREATININE 0.97 06/02/2018 1432   CALCIUM 10.1 06/02/2018 1432   PROT 6.6 09/08/2016 0517   ALBUMIN 3.7 09/08/2016 0517   AST 29 09/08/2016 0517   ALT 27 09/08/2016 0517   ALKPHOS 56 09/08/2016 0517   BILITOT 0.6 09/08/2016 0517   GFRNONAA >60 09/08/2016 0517   GFRAA >60 09/08/2016 0517       Component Value Date/Time   WBC 6.7 12/20/2017 1416   RBC 4.07 12/20/2017 1416   HGB 12.8 12/20/2017 1416   HCT 37.5  12/20/2017 1416   PLT 290.0 12/20/2017 1416   MCV 92.3 12/20/2017 1416   MCH 31.4 09/08/2016 0517   MCHC 34.1 12/20/2017 1416   RDW 12.1 12/20/2017 1416   LYMPHSABS 2.7 12/20/2017 1416   MONOABS 0.6 12/20/2017 1416   EOSABS 0.1 12/20/2017 1416   BASOSABS 0.0 12/20/2017 1416    No results found for: POCLITH, LITHIUM   No results found for: PHENYTOIN, PHENOBARB, VALPROATE, CBMZ   .res Assessment: Plan:    Cayleen was seen today for follow-up, anxiety and medication refill.  Diagnoses and all orders for this  visit:  Generalized anxiety disorder  Insomnia due to mental condition   Greater than 50% of 30 min face to face time with patient was spent on counseling and coordination of care. We discussed Ms. Pendell has a long history of anxiety which in the past had contributed to unmanageable diarrhea another bowel symptoms including bowel pain.  These symptoms have been much improved with a combination of lorazepam and mirtazapine.  She has some chronic insomnia which is generally manageable with the medication.  We have been able to reduce the lorazepam some over the last couple of years and she is probably at the lowest tolerated dose and lowest effective dose.  She takes lorazepam 1 mg at 7A and 5p takes 0.5 mg and 1mg  at night.  Timing does not suggest afternoon tiredness is related to lorazepam.  If she misses the med she feels shakey and anxious.  Anxiety worsens her chronic bowel problems. Mother also used to have to take a nap after lunch also.  Disc napping that is normal.  Sleep hygiene disc in detail and esp in relation to napping.  We discussed the short-term risks associated with benzodiazepines including sedation and increased fall risk among others.  Discussed long-term side effect risk including dependence, potential withdrawal symptoms, and the potential eventual dose-related risk of dementia.  Use Ambien prn.  Disc risk amnesia.    She needs the meds and no indication for changes.  Supportive and cognitive therapy techniques around age-related declines that both she and her husband are experiencing and the anxiety that she has had in response.  She is still dealing with isolation and problems with it.  Isolation is causing some insomnia.    This appt was 30 mins.  FU 6 mos  Lynder Parents, MD, DFAPA   Please see After Visit Summary for patient specific instructions.  Future Appointments  Date Time Provider [place]  09/19/2019  2:10 PM Milus Banister, MD [facility]  [facility]    No orders of the defined types were placed in this encounter.     -------------------------------

## 2019-09-19 ENCOUNTER — Ambulatory Visit: Payer: Medicare Other | Admitting: Gastroenterology

## 2019-09-19 ENCOUNTER — Encounter: Payer: Self-pay | Admitting: Gastroenterology

## 2019-09-19 VITALS — BP 162/86 | HR 84 | Temp 97.1°F | Ht 61.5 in | Wt 111.4 lb

## 2019-09-19 DIAGNOSIS — K5900 Constipation, unspecified: Secondary | ICD-10-CM

## 2019-09-19 MED ORDER — LUBIPROSTONE 24 MCG PO CAPS: 24.0000 ug | ORAL_CAPSULE | Freq: Every day | ORAL | 11 refills | Status: DC

## 2019-09-19 NOTE — Patient Instructions (Addendum)
If you are age 84 or older, your body mass index should be between 23-30. Your Body mass index is 20.71 kg/m. If this is out of the aforementioned range listed, please consider follow up with your Primary Care Provider.  If you are age 59 or younger, your body mass index should be between 19-25. Your Body mass index is 20.71 kg/m. If this is out of the aformentioned range listed, please consider follow up with your Primary Care Provider.   We have sent the following medications to your pharmacy for you to pick up at your convenience: Amitiza 24 mcg daily.   Continue Citrucel daily.  Call with an update in 4-6 weeks ask to speak with Patty.

## 2019-09-19 NOTE — Progress Notes (Signed)
HPI: This is a pleasant 86 year old woman  Christina Escobar underwent a sigmoid colectomy and incidental appendectomy in July 2001 due to sigmoid diverticulitis with stenosis. She had a colonoscopy in February 2007 by Dr. Timmothy Euler that was a normal colonoscopy to the cecum status post sigmoid resection. In 2014 she was evaluated by Dr. Paulita Fujita for hemorrhoids. She had a colonoscopy by Dr. Paulita Fujita in March 2014.She was noted to have benign colonic mucosa and biopsies were negative for microscopic colitis, active inflammation, granulomas or adenomatous changes. A few medium mouth diverticuli were found in the sigmoid and in the descending colon. She was sent for anorectal manometry that she had performed at [facility]. 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 She was noted tohave type II dissynergy.She was evaluated by Dr. Marcello Moores in April 2014 he was presented with the options of continued medical management with increased fiber, biofeedback therapy, or rectal ultrasound to evaluate for the need injection and sacral nerve stimulators. Biofeedback seemed to help.  Recurrent C. Difficile colitis,underwent colonoscopy Dr. Ardis Hughs with [facility] 10/2016;commercially available fecal material infused in the TI, under the direction of Dr. Graylon Good from [facility]. Follow up with [facility] two months afterwards, stooling much improving, deemed a success.  I last saw her here in the office about 4 or 5 months ago.  At that time she was having constipation.  She was taking a good dose of powder fiber supplement every day and I decided to add a low-dose Linzess 1 pill once daily in addition to the fiber supplement.  Since then she has had alternating constipation and diarrhea after trials of Linzess, Amitiza, alternating her schedule of Citrucel.  It is very clear that Linzess 72 mcg dosing is too strong for her.  It is also clear that Amitiza 8 mcg twice daily is not strong enough for  her  She has having some minor hemorrhoidal type bleeding. Tends to have 8 to 10 days of straining to move her bowels with little bowel movement response and then a single day of significant loose stools  ROS: complete GI ROS as described in HPI, all other review negative.  Constitutional:  No unintentional weight loss   Past Medical History:  Diagnosis Date  . Allergy   . Anal fissure   . Anxiety   . Basal cell carcinoma   . Cataracts, bilateral   . Clostridium difficile diarrhea   . Depression   . Diverticulosis    hx of stricture in colon  . Hemorrhoids   . Hypertension   . IBS (irritable bowel syndrome)   . Insomnia   . Migraine   . Osteopenia   . Thrombosed hemorrhoids     Past Surgical History:  Procedure Laterality Date  . APPENDECTOMY    . BASAL CELL CARCINOMA EXCISION  [id number]  . CATARACT EXTRACTION, BILATERAL Bilateral 1999  . COLON RESECTION  2001  . COLONOSCOPY WITH PROPOFOL N/A 10/22/2016   Procedure: COLONOSCOPY WITH PROPOFOL;  Surgeon: Milus Banister, MD;  Location: [facility];  Service: Endoscopy;  Laterality: N/A;  . FECAL TRANSPLANT N/A 10/22/2016   Procedure: FECAL TRANSPLANT;  Surgeon: Milus Banister, MD;  Location: [facility];  Service: Endoscopy;  Laterality: N/A;  . INCISION AND DRAINAGE  11/05/2011   thrombosed hemorrhoid  . TRANSANAL EXCISION OF RECTAL MASS WITH HEMORRHOID INJECTION  2007   anal fissure    Current Outpatient Medications  Medication Sig Dispense Refill  .  acetaminophen (TYLENOL) 500 MG tablet Take 1,000 mg by mouth 2 (two) times daily.     Marland Kitchen amLODipine (NORVASC) 5 MG tablet Take 5 mg by mouth daily.    . calcium carbonate (TUMS - DOSED IN MG ELEMENTAL CALCIUM) 500 MG chewable tablet Chew 1 tablet by mouth daily.    . Calcium Carbonate-Vitamin D3 (CALCIUM 600-D) 600-400 MG-UNIT TABS Take 1 tablet by mouth daily.     Marland Kitchen loratadine (CLARITIN) 10 MG tablet Take 10 mg by mouth daily.    Marland Kitchen LORazepam (ATIVAN) 0.5 MG tablet Take 1  tablet (0.5 mg total) by mouth every evening. Pt takes this dose at 5pm. 30 tablet 5  . LORazepam (ATIVAN) 1 MG tablet Take 1 tablet (1 mg total) by mouth 2 (two) times daily at 8 am and 10 pm. 60 tablet 5  . methylcellulose (CITRUCEL) oral powder Take by mouth daily.    . mirtazapine (REMERON) 45 MG tablet Take 1 tablet (45 mg total) by mouth at bedtime. 30 tablet 11  . Multiple Vitamin (MULTIVITAMIN WITH MINERALS) TABS tablet Take 1 tablet by mouth daily.    . quinapril (ACCUPRIL) 40 MG tablet Take 40 mg by mouth Daily.     . Simethicone (GAS-X PO) Take by mouth.    . SUMAtriptan (IMITREX) 25 MG tablet Take 25 mg by mouth every 2 (two) hours as needed for migraine.     Marland Kitchen zolpidem (AMBIEN) 5 MG tablet Take 1 tablet (5 mg total) by mouth at bedtime as needed for sleep. 30 tablet 1   No current facility-administered medications for this visit.    Allergies as of 09/19/2019 - Review Complete 09/19/2019  Allergen Reaction Noted  . Cefdinir  09/05/1916  . Erythromycin Nausea And Vomiting 12/05/2014  . Flagyl [metronidazole] Diarrhea 02/28/2013  . Meloxicam Other (See Comments) 04/18/2016    Family History  Problem Relation Age of Onset  . Alzheimer's disease Mother   . Heart failure Mother   . Heart disease Father   . Heart failure Father   . Other Maternal Grandfather        cerebral hemorrhage  . Skin cancer Brother   . Heart disease Brother   . Colon cancer Neg Hx   . Stomach cancer Neg Hx   . Pancreatic cancer Neg Hx     Social History   Socioeconomic History  . Marital status: Married    Spouse name: Paediatric nurse  . Number of children: 2  . Years of education: MA  . Highest education level: Not on file  Occupational History  . Occupation: retired Pharmacist, hospital  Tobacco Use  . Smoking status: Never Smoker  . Smokeless tobacco: Never Used  Substance and Sexual Activity  . Alcohol use: Yes    Alcohol/week: 0.0 standard drinks    Comment: occasional glass of wine -2 per week  .  Drug use: No  . Sexual activity: Not on file  Other Topics Concern  . Not on file  Social History Narrative   Pt lives at home with her Spouse. She has 3 children, (1 adopted). Spouse is a retired Film/video editor.   Caffeine Use: 1-2 cups daily.   Social Determinants of Health   Financial Resource Strain:   . Difficulty of Paying Living Expenses: Not on file  Food Insecurity:   . Worried About Charity fundraiser in the Last Year: Not on file  . Ran Out of Food in the Last Year: Not on file  Transportation Needs:   .  Lack of Transportation (Medical): Not on file  . Lack of Transportation (Non-Medical): Not on file  Physical Activity:   . Days of Exercise per Week: Not on file  . Minutes of Exercise per Session: Not on file  Stress:   . Feeling of Stress : Not on file  Social Connections:   . Frequency of Communication with Friends and Family: Not on file  . Frequency of Social Gatherings with Friends and Family: Not on file  . Attends Religious Services: Not on file  . Active Member of Clubs or Organizations: Not on file  . Attends Archivist Meetings: Not on file  . Marital Status: Not on file  Intimate Partner Violence:   . Fear of Current or Ex-Partner: Not on file  . Emotionally Abused: Not on file  . Physically Abused: Not on file  . Sexually Abused: Not on file     Physical Exam: BP (!) 162/86   Pulse 84   Temp (!) 97.1 F (36.2 C)   Ht 5' 1.5" (1.562 m)   Wt 111 lb 6.4 oz (50.5 kg)   BMI 20.71 kg/m  Constitutional: generally well-appearing Psychiatric: alert and oriented x3 Abdomen: soft, nontender, nondistended, no obvious ascites, no peritoneal signs, normal bowel sounds No peripheral edema noted in lower extremities  Assessment and plan: 86 y.o. female with chronic constipation, minor rectal bleeding likely hemorrhoidal  She is going to continue taking Citrucel fiber supplement on a daily basis. She is going to try higher dose of Amitiza at 24  mcg pill dosing and she will take 1 pill once daily of that. She will call to report on her response in 4 to 6 weeks. My hope is that as her constipation eases then she will have less bother from her hemorrhoids. If her constipation is not improving at the above dosing then Amitiza will be increased to twice daily dosing.  Please see the "Patient Instructions" section for addition details about the plan.  Christina Loffler, MD [facility] 09/19/2019, 2:24 PM

## 2019-10-06 ENCOUNTER — Telehealth: Payer: Self-pay | Admitting: Gastroenterology

## 2019-10-06 NOTE — Telephone Encounter (Signed)
Patient is calling in reference to medication complications

## 2019-10-18 ENCOUNTER — Telehealth: Payer: Self-pay | Admitting: Gastroenterology

## 2019-10-18 NOTE — Telephone Encounter (Signed)
I agree with her taking the low-dose Amitiza every other day as well as continuing her Citrucel every day.  Thank you

## 2019-10-18 NOTE — Telephone Encounter (Signed)
The pt has been advised and will call back with an update

## 2019-10-18 NOTE — Telephone Encounter (Signed)
Pt called to give you an update on her progress. Pls call her before 1:30pm or after 3:15pm.

## 2019-10-18 NOTE — Telephone Encounter (Signed)
The pt states that she took Amitiza 24 mcg for several days and had diarrhea and fecal incontinence.  She had Amitiza 8 mcg on hand and took that for 8 days and states that the diarrhea continued.  She would like to try taking it every other day and call back with an update on her response. She is currently still taking Citrucel.   Pt was advised that I will send to Dr Ardis Hughs for review and further recommendations.

## 2019-11-02 ENCOUNTER — Encounter (HOSPITAL_COMMUNITY): Payer: Medicare Other

## 2019-12-04 ENCOUNTER — Telehealth: Payer: Self-pay | Admitting: Gastroenterology

## 2019-12-04 NOTE — Telephone Encounter (Signed)
Returned call to patient regarding Amitiza.  Patient states that she did not see any improvements with constipation while taking Amitiza, and the medicine " did not agree with her body."  She has recently discontinued medication on her own.  She is happy with the way she is feeling and does not wish to start any new medications at this time.  She continues to have issues with constipation, but does not feel that it is as bad as it has been in the past. Patient wanted to make you aware that she had discontinued Amitiza.

## 2019-12-20 ENCOUNTER — Encounter (HOSPITAL_COMMUNITY): Payer: Self-pay

## 2019-12-20 ENCOUNTER — Inpatient Hospital Stay (HOSPITAL_COMMUNITY)
Admission: EM | Admit: 2019-12-20 | Discharge: 2019-12-23 | DRG: 066 | Disposition: A | Discharge status: Skilled Nursing Facility | Payer: Medicare Other | Attending: Internal Medicine | Admitting: Internal Medicine

## 2019-12-20 ENCOUNTER — Emergency Department (HOSPITAL_COMMUNITY): Payer: Medicare Other

## 2019-12-20 ENCOUNTER — Other Ambulatory Visit: Payer: Self-pay

## 2019-12-20 DIAGNOSIS — Z20822 Contact with and (suspected) exposure to covid-19: Secondary | ICD-10-CM | POA: Diagnosis present

## 2019-12-20 DIAGNOSIS — Z881 Allergy status to other antibiotic agents status: Secondary | ICD-10-CM

## 2019-12-20 DIAGNOSIS — G8311 Monoplegia of lower limb affecting right dominant side: Secondary | ICD-10-CM | POA: Diagnosis present

## 2019-12-20 DIAGNOSIS — Z9109 Other allergy status, other than to drugs and biological substances: Secondary | ICD-10-CM

## 2019-12-20 DIAGNOSIS — G43909 Migraine, unspecified, not intractable, without status migrainosus: Secondary | ICD-10-CM | POA: Diagnosis present

## 2019-12-20 DIAGNOSIS — K589 Irritable bowel syndrome without diarrhea: Secondary | ICD-10-CM | POA: Diagnosis present

## 2019-12-20 DIAGNOSIS — I088 Other rheumatic multiple valve diseases: Secondary | ICD-10-CM | POA: Diagnosis present

## 2019-12-20 DIAGNOSIS — K581 Irritable bowel syndrome with constipation: Secondary | ICD-10-CM | POA: Diagnosis not present

## 2019-12-20 DIAGNOSIS — Z8614 Personal history of Methicillin resistant Staphylococcus aureus infection: Secondary | ICD-10-CM

## 2019-12-20 DIAGNOSIS — M545 Low back pain: Secondary | ICD-10-CM | POA: Diagnosis present

## 2019-12-20 DIAGNOSIS — I639 Cerebral infarction, unspecified: Principal | ICD-10-CM

## 2019-12-20 DIAGNOSIS — Z9842 Cataract extraction status, left eye: Secondary | ICD-10-CM | POA: Diagnosis not present

## 2019-12-20 DIAGNOSIS — G8929 Other chronic pain: Secondary | ICD-10-CM | POA: Diagnosis present

## 2019-12-20 DIAGNOSIS — I739 Peripheral vascular disease, unspecified: Secondary | ICD-10-CM | POA: Diagnosis present

## 2019-12-20 DIAGNOSIS — Z808 Family history of malignant neoplasm of other organs or systems: Secondary | ICD-10-CM

## 2019-12-20 DIAGNOSIS — I10 Essential (primary) hypertension: Secondary | ICD-10-CM | POA: Diagnosis present

## 2019-12-20 DIAGNOSIS — F419 Anxiety disorder, unspecified: Secondary | ICD-10-CM | POA: Diagnosis present

## 2019-12-20 DIAGNOSIS — G8314 Monoplegia of lower limb affecting left nondominant side: Secondary | ICD-10-CM | POA: Diagnosis not present

## 2019-12-20 DIAGNOSIS — F329 Major depressive disorder, single episode, unspecified: Secondary | ICD-10-CM | POA: Diagnosis present

## 2019-12-20 DIAGNOSIS — R29702 NIHSS score 2: Secondary | ICD-10-CM | POA: Diagnosis not present

## 2019-12-20 DIAGNOSIS — E785 Hyperlipidemia, unspecified: Secondary | ICD-10-CM | POA: Diagnosis present

## 2019-12-20 DIAGNOSIS — I63032 Cerebral infarction due to thrombosis of left carotid artery: Secondary | ICD-10-CM | POA: Diagnosis not present

## 2019-12-20 DIAGNOSIS — R29898 Other symptoms and signs involving the musculoskeletal system: Secondary | ICD-10-CM | POA: Diagnosis not present

## 2019-12-20 DIAGNOSIS — Z9841 Cataract extraction status, right eye: Secondary | ICD-10-CM

## 2019-12-20 DIAGNOSIS — Z85828 Personal history of other malignant neoplasm of skin: Secondary | ICD-10-CM

## 2019-12-20 DIAGNOSIS — R29701 NIHSS score 1: Secondary | ICD-10-CM | POA: Diagnosis present

## 2019-12-20 DIAGNOSIS — F411 Generalized anxiety disorder: Secondary | ICD-10-CM

## 2019-12-20 DIAGNOSIS — I6389 Other cerebral infarction: Secondary | ICD-10-CM | POA: Diagnosis not present

## 2019-12-20 DIAGNOSIS — R2681 Unsteadiness on feet: Secondary | ICD-10-CM | POA: Diagnosis not present

## 2019-12-20 DIAGNOSIS — Z886 Allergy status to analgesic agent status: Secondary | ICD-10-CM | POA: Diagnosis not present

## 2019-12-20 DIAGNOSIS — R262 Difficulty in walking, not elsewhere classified: Secondary | ICD-10-CM | POA: Diagnosis not present

## 2019-12-20 DIAGNOSIS — Z8249 Family history of ischemic heart disease and other diseases of the circulatory system: Secondary | ICD-10-CM

## 2019-12-20 DIAGNOSIS — E7800 Pure hypercholesterolemia, unspecified: Secondary | ICD-10-CM | POA: Diagnosis not present

## 2019-12-20 DIAGNOSIS — E782 Mixed hyperlipidemia: Secondary | ICD-10-CM | POA: Diagnosis not present

## 2019-12-20 DIAGNOSIS — Z888 Allergy status to other drugs, medicaments and biological substances status: Secondary | ICD-10-CM

## 2019-12-20 DIAGNOSIS — Z79899 Other long term (current) drug therapy: Secondary | ICD-10-CM

## 2019-12-20 LAB — CBC WITH DIFFERENTIAL/PLATELET
Abs Immature Granulocytes: 0.01 10*3/uL (ref 0.00–0.07)
Basophils Absolute: 0.1 10*3/uL (ref 0.0–0.1)
Basophils Relative: 1 %
Eosinophils Absolute: 0.1 10*3/uL (ref 0.0–0.5)
Eosinophils Relative: 1 %
HCT: 38.6 % (ref 36.0–46.0)
Hemoglobin: 12.6 g/dL (ref 12.0–15.0)
Immature Granulocytes: 0 %
Lymphocytes Relative: 38 %
Lymphs Abs: 2.2 10*3/uL (ref 0.7–4.0)
MCH: 30.7 pg (ref 26.0–34.0)
MCHC: 32.6 g/dL (ref 30.0–36.0)
MCV: 93.9 fL (ref 80.0–100.0)
Monocytes Absolute: 0.4 10*3/uL (ref 0.1–1.0)
Monocytes Relative: 7 %
Neutro Abs: 3.1 10*3/uL (ref 1.7–7.7)
Neutrophils Relative %: 53 %
Platelets: 244 10*3/uL (ref 150–400)
RBC: 4.11 MIL/uL (ref 3.87–5.11)
RDW: 11.5 % (ref 11.5–15.5)
WBC: 5.7 10*3/uL (ref 4.0–10.5)
nRBC: 0 % (ref 0.0–0.2)

## 2019-12-20 LAB — COMPREHENSIVE METABOLIC PANEL
ALT: 16 U/L (ref 0–44)
AST: 32 U/L (ref 15–41)
Albumin: 4 g/dL (ref 3.5–5.0)
Alkaline Phosphatase: 71 U/L (ref 38–126)
Anion gap: 10 (ref 5–15)
BUN: 12 mg/dL (ref 8–23)
CO2: 26 mmol/L (ref 22–32)
Calcium: 9.9 mg/dL (ref 8.9–10.3)
Chloride: 102 mmol/L (ref 98–111)
Creatinine, Ser: 0.96 mg/dL (ref 0.44–1.00)
GFR calc Af Amer: >60 mL/min (ref >60)
GFR calc non Af Amer: 53 mL/min — ABNORMAL LOW (ref >60)
Glucose, Bld: 106 mg/dL — ABNORMAL HIGH (ref 70–99)
Potassium: 4.4 mmol/L (ref 3.5–5.1)
Sodium: 138 mmol/L (ref 135–145)
Total Bilirubin: 0.7 mg/dL (ref 0.3–1.2)
Total Protein: 7 g/dL (ref 6.5–8.1)

## 2019-12-20 LAB — URINALYSIS, ROUTINE W REFLEX MICROSCOPIC
Bilirubin Urine: NEGATIVE
Glucose, UA: NEGATIVE mg/dL
Hgb urine dipstick: NEGATIVE
Ketones, ur: NEGATIVE mg/dL
Leukocytes,Ua: NEGATIVE
Nitrite: NEGATIVE
Protein, ur: NEGATIVE mg/dL
Specific Gravity, Urine: 1.008 (ref 1.005–1.030)
pH: 8 (ref 5.0–8.0)

## 2019-12-20 LAB — SARS CORONAVIRUS 2 (TAT 6-24 HRS): SARS Coronavirus 2: NEGATIVE

## 2019-12-20 MED ORDER — ASPIRIN EC 81 MG PO TBEC
81.0000 mg | DELAYED_RELEASE_TABLET | Freq: Every day | ORAL | Status: DC
  Administered 2019-12-21 – 2019-12-23 (×3): 81 mg via ORAL
  Filled 2019-12-20 (×3): qty 1

## 2019-12-20 MED ORDER — LORAZEPAM 1 MG PO TABS: 0.5000 mg | ORAL_TABLET | Freq: Every evening | ORAL | Status: DC

## 2019-12-20 MED ORDER — CALCIUM CARBONATE-VITAMIN D 500-200 MG-UNIT PO TABS
1.0000 | ORAL_TABLET | Freq: Every day | ORAL | Status: DC
  Administered 2019-12-21 – 2019-12-23 (×3): 1 via ORAL
  Filled 2019-12-20 (×4): qty 1

## 2019-12-20 MED ORDER — ACETAMINOPHEN 160 MG/5ML PO SOLN: 650.0000 mg | ORAL | Status: DC | PRN

## 2019-12-20 MED ORDER — HYDRALAZINE HCL 25 MG PO TABS: 25.0000 mg | ORAL_TABLET | Freq: Four times a day (QID) | ORAL | Status: DC | PRN

## 2019-12-20 MED ORDER — SUMATRIPTAN SUCCINATE 25 MG PO TABS
25.0000 mg | ORAL_TABLET | ORAL | Status: DC | PRN
  Administered 2019-12-21: 25 mg via ORAL
  Filled 2019-12-20 (×3): qty 1

## 2019-12-20 MED ORDER — ACETAMINOPHEN 500 MG PO TABS: 1000.0000 mg | ORAL_TABLET | Freq: Two times a day (BID) | ORAL | Status: DC

## 2019-12-20 MED ORDER — SIMETHICONE 80 MG PO CHEW: 80.0000 mg | CHEWABLE_TABLET | Freq: Four times a day (QID) | ORAL | Status: DC | PRN

## 2019-12-20 MED ORDER — ADULT MULTIVITAMIN W/MINERALS CH
1.0000 | ORAL_TABLET | Freq: Every day | ORAL | Status: DC
  Administered 2019-12-21 – 2019-12-23 (×3): 1 via ORAL
  Filled 2019-12-20 (×3): qty 1

## 2019-12-20 MED ORDER — LORATADINE 10 MG PO TABS
10.0000 mg | ORAL_TABLET | Freq: Every day | ORAL | Status: DC
  Administered 2019-12-22: 10 mg via ORAL
  Filled 2019-12-20 (×3): qty 1

## 2019-12-20 MED ORDER — ATORVASTATIN CALCIUM 40 MG PO TABS
40.0000 mg | ORAL_TABLET | Freq: Every day | ORAL | Status: DC
  Administered 2019-12-20 – 2019-12-22 (×3): 40 mg via ORAL
  Filled 2019-12-20 (×4): qty 1

## 2019-12-20 MED ORDER — CLOPIDOGREL BISULFATE 75 MG PO TABS
75.0000 mg | ORAL_TABLET | Freq: Every day | ORAL | Status: DC
  Administered 2019-12-21 – 2019-12-23 (×3): 75 mg via ORAL
  Filled 2019-12-20 (×3): qty 1

## 2019-12-20 MED ORDER — ZOLPIDEM TARTRATE 5 MG PO TABS
5.0000 mg | ORAL_TABLET | Freq: Every evening | ORAL | Status: DC | PRN
  Administered 2019-12-22: 5 mg via ORAL
  Filled 2019-12-20: qty 1

## 2019-12-20 MED ORDER — MUPIROCIN 2 % EX OINT
1.0000 "application " | TOPICAL_OINTMENT | Freq: Every day | CUTANEOUS | Status: DC
  Administered 2019-12-21 – 2019-12-23 (×3): 1 via TOPICAL
  Filled 2019-12-20 (×2): qty 22

## 2019-12-20 MED ORDER — SENNOSIDES-DOCUSATE SODIUM 8.6-50 MG PO TABS
1.0000 | ORAL_TABLET | Freq: Every evening | ORAL | Status: DC | PRN
  Filled 2019-12-20: qty 1

## 2019-12-20 MED ORDER — POLYETHYLENE GLYCOL 3350 17 G PO PACK
1.0000 | PACK | Freq: Every day | ORAL | Status: DC | PRN
  Administered 2019-12-21 – 2019-12-22 (×2): 17 g via ORAL
  Filled 2019-12-20 (×3): qty 1

## 2019-12-20 MED ORDER — ASPIRIN 325 MG PO TABS
325.0000 mg | ORAL_TABLET | Freq: Once | ORAL | Status: AC
  Administered 2019-12-20: 325 mg via ORAL
  Filled 2019-12-20: qty 1

## 2019-12-20 MED ORDER — HEPARIN SODIUM (PORCINE) 5000 UNIT/ML IJ SOLN
5000.0000 [IU] | Freq: Two times a day (BID) | INTRAMUSCULAR | Status: DC
  Administered 2019-12-20 – 2019-12-23 (×6): 5000 [IU] via SUBCUTANEOUS
  Filled 2019-12-20 (×7): qty 1

## 2019-12-20 MED ORDER — ACETAMINOPHEN 325 MG PO TABS
650.0000 mg | ORAL_TABLET | ORAL | Status: DC | PRN
  Administered 2019-12-21: 650 mg via ORAL

## 2019-12-20 MED ORDER — CALCIUM CARBONATE ANTACID 500 MG PO CHEW
1.0000 | CHEWABLE_TABLET | Freq: Every day | ORAL | Status: DC
  Administered 2019-12-22: 200 mg via ORAL
  Filled 2019-12-20 (×3): qty 1

## 2019-12-20 MED ORDER — MIRTAZAPINE 15 MG PO TABS
45.0000 mg | ORAL_TABLET | Freq: Every day | ORAL | Status: DC
  Administered 2019-12-21 – 2019-12-22 (×3): 45 mg via ORAL
  Filled 2019-12-20 (×3): qty 3
  Filled 2019-12-20: qty 1

## 2019-12-20 MED ORDER — ACETAMINOPHEN 650 MG RE SUPP: 650.0000 mg | RECTAL | Status: DC | PRN

## 2019-12-20 MED ORDER — LORAZEPAM 1 MG PO TABS
1.0000 mg | ORAL_TABLET | Freq: Two times a day (BID) | ORAL | Status: DC
  Administered 2019-12-20 – 2019-12-23 (×6): 1 mg via ORAL
  Filled 2019-12-20 (×6): qty 1

## 2019-12-20 MED ORDER — STROKE: EARLY STAGES OF RECOVERY BOOK
Freq: Once | Status: DC
  Filled 2019-12-20: qty 1

## 2019-12-20 NOTE — ED Triage Notes (Signed)
Pt bib [facility] for fatigue beginning Monday pm when walking. She was unable to walk her dog as far as normal. This weakness continues. 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 V/S stable, cbg 129, 1st degree AV block.

## 2019-12-20 NOTE — ED Provider Notes (Signed)
[facility] EMERGENCY DEPARTMENT Provider Note   CSN: [id number] Arrival date & time: 12/20/19  1058     History Chief Complaint  Patient presents with  . Weakness    bilat legs     Christina Escobar is a 86 y.o. female with a past medical history of hypertension, IBS, anxiety presenting to the ED with a chief complaint of generalized weakness in bilateral lower extremities.  States that on the morning of 12/18/2019, she was walking to the bathroom when she felt like her legs were getting weak.  She went to bed that night in her usual state of health.  She cannot recall any incident that may have triggered this weakness.  She denies any injuries or falls.  She does have some chronic lower back pain.  She saw the provider at the [facility] that [facility] in, x-rays were done which showed arthritis.  She remains ambulatory but states that she feels "everything is taking longer than usual because I am so slow."  She denies history of similar symptoms in the past.  She was treated for a MRSA skin infection and completed antibiotics about a week ago.  She denies any headache, numbness in arms or legs, fever, chest pain, abdominal pain, vomiting, diarrhea.  HPI     Past Medical History:  Diagnosis Date  . Allergy   . Anal fissure   . Anxiety   . Basal cell carcinoma   . Cataracts, bilateral   . Clostridium difficile diarrhea   . Depression   . Diverticulosis    hx of stricture in colon  . Hemorrhoids   . Hypertension   . IBS (irritable bowel syndrome)   . Insomnia   . Migraine   . Osteopenia   . Thrombosed hemorrhoids     Patient Active Problem List   Diagnosis Date Noted  . GAD (generalized anxiety disorder) 08/24/2018  . Generalized abdominal pain 06/02/2018  . Low back pain 06/02/2018  . Diarrhea of presumed infectious origin   . Diarrhea 09/05/2016  . Depression 09/04/2016  . Hypokalemia 09/04/2016  . Recurrent colitis due to Clostridium difficile  05/11/2016  . Essential hypertension 05/11/2016  . Anxiety state 05/11/2016  . Recurrent Clostridium difficile diarrhea   . Clostridium difficile infection 03/27/2016  . Antibiotic enterocolitis 03/25/2016  . Migraine 02/28/2013  . Hemorrhoids, external, thrombosed 11/05/2011    Past Surgical History:  Procedure Laterality Date  . APPENDECTOMY    . BASAL CELL CARCINOMA EXCISION  [id number]  . CATARACT EXTRACTION, BILATERAL Bilateral 1999  . COLON RESECTION  2001  . COLONOSCOPY WITH PROPOFOL N/A 10/22/2016   Procedure: COLONOSCOPY WITH PROPOFOL;  Surgeon: Milus Banister, MD;  Location: [facility];  Service: Endoscopy;  Laterality: N/A;  . FECAL TRANSPLANT N/A 10/22/2016   Procedure: FECAL TRANSPLANT;  Surgeon: Milus Banister, MD;  Location: [facility];  Service: Endoscopy;  Laterality: N/A;  . INCISION AND DRAINAGE  11/05/2011   thrombosed hemorrhoid  . TRANSANAL EXCISION OF RECTAL MASS WITH HEMORRHOID INJECTION  2007   anal fissure     OB History   No obstetric history on file.     Family History  Problem Relation Age of Onset  . Alzheimer's disease Mother   . Heart failure Mother   . Heart disease Father   . Heart failure Father   . Other Maternal Grandfather        cerebral hemorrhage  . Skin cancer Brother   . Heart disease  Brother   . Colon cancer Neg Hx   . Stomach cancer Neg Hx   . Pancreatic cancer Neg Hx     Social History   Tobacco Use  . Smoking status: Never Smoker  . Smokeless tobacco: Never Used  Substance Use Topics  . Alcohol use: Yes    Alcohol/week: 0.0 standard drinks    Comment: occasional glass of wine -2 per week  . Drug use: No    Home Medications Prior to Admission medications   Medication Sig Start Date End Date Taking? Authorizing Provider  acetaminophen (TYLENOL) 500 MG tablet Take 1,000 mg by mouth 2 (two) times daily.     [provider]  amLODipine (NORVASC) 5 MG tablet Take 5 mg by mouth daily.    [provider]  calcium carbonate (TUMS - DOSED IN MG ELEMENTAL CALCIUM) 500 MG chewable tablet Chew 1 tablet by mouth daily.    [provider]  Calcium Carbonate-Vitamin D3 (CALCIUM 600-D) 600-400 MG-UNIT TABS Take 1 tablet by mouth daily.     [provider]  loratadine (CLARITIN) 10 MG tablet Take 10 mg by mouth daily.    [provider]  LORazepam (ATIVAN) 0.5 MG tablet Take 1 tablet (0.5 mg total) by mouth every evening. Pt takes this dose at 5pm. 09/13/19   Cottle, Billey Co., MD  LORazepam (ATIVAN) 1 MG tablet Take 1 tablet (1 mg total) by mouth 2 (two) times daily at 8 am and 10 pm. 09/13/19   Cottle, Billey Co., MD  methylcellulose (CITRUCEL) oral powder Take 1 packet by mouth daily.     [provider]  mirtazapine (REMERON) 45 MG tablet Take 1 tablet (45 mg total) by mouth at bedtime. 03/03/19   Cottle, Billey Co., MD  Multiple Vitamin (MULTIVITAMIN WITH MINERALS) TABS tablet Take 1 tablet by mouth daily.    [provider]  mupirocin ointment (BACTROBAN) 2 % Apply 1 application topically daily. 12/04/19   [provider]  quinapril (ACCUPRIL) 40 MG tablet Take 40 mg by mouth Daily.     [provider]  Simethicone (GAS-X PO) Take 1 tablet by mouth daily.     [provider]  SUMAtriptan (IMITREX) 25 MG tablet Take 25 mg by mouth every 2 (two) hours as needed for migraine.     [provider]  zolpidem (AMBIEN) 5 MG tablet Take 1 tablet (5 mg total) by mouth at bedtime as needed for sleep. 09/13/19   Cottle, Billey Co., MD    Allergies    Cefdinir, Erythromycin, Flagyl [metronidazole], and Meloxicam  Review of Systems   Review of Systems  Constitutional: Negative for appetite change, chills and fever.  HENT: Negative for ear pain, rhinorrhea, sneezing and sore throat.   Eyes: Negative for photophobia and visual disturbance.  Respiratory: Negative for cough, chest tightness, shortness of breath and  wheezing.   Cardiovascular: Negative for chest pain and palpitations.  Gastrointestinal: Negative for abdominal pain, blood in stool, constipation, diarrhea, nausea and vomiting.  Genitourinary: Negative for dysuria, hematuria and urgency.  Musculoskeletal: Negative for myalgias.  Skin: Negative for rash.  Neurological: Positive for weakness. Negative for dizziness and light-headedness.    Physical Exam Updated Vital Signs BP 135/63 (BP Location: Right Arm)   Pulse 72   Temp 97.6 F (36.4 C) (Oral)   Resp 18   Ht 5\' 1"  (1.549 m)   Wt 49.9 kg   SpO2 93%   BMI 20.78 kg/m  Physical Exam Vitals and nursing note reviewed.  Constitutional:      General: She is not in acute distress.    Appearance: She is well-developed.  HENT:     Head: Normocephalic and atraumatic.     Nose: Nose normal.  Eyes:     General: No scleral icterus.       Right eye: No discharge.        Left eye: No discharge.     Conjunctiva/sclera: Conjunctivae normal.     Pupils: Pupils are equal, round, and reactive to light.  Cardiovascular:     Rate and Rhythm: Normal rate and regular rhythm.     Heart sounds: Normal heart sounds. No murmur. No friction rub. No gallop.   Pulmonary:     Effort: Pulmonary effort is normal. No respiratory distress.     Breath sounds: Normal breath sounds.  Abdominal:     General: Bowel sounds are normal. There is no distension.     Palpations: Abdomen is soft.     Tenderness: There is no abdominal tenderness. There is no guarding.  Musculoskeletal:        General: Normal range of motion.     Cervical back: Normal range of motion and neck supple.  Skin:    General: Skin is warm and dry.     Findings: No rash.  Neurological:     General: No focal deficit present.     Mental Status: She is alert and oriented to person, place, and time.     Cranial Nerves: No cranial nerve deficit.     Sensory: No sensory deficit.     Motor: No weakness or abnormal muscle tone.      Coordination: Coordination abnormal.     Comments: Pupils reactive. No facial asymmetry noted. Cranial nerves appear grossly intact. Sensation intact to light touch on face, BUE and BLE. Strength 5/5 in BUE and BLE. Abnormal finger to nose coordination on R side.     ED Results / Procedures / Treatments   Labs (all labs ordered are listed, but only abnormal results are displayed) Labs Reviewed  COMPREHENSIVE METABOLIC PANEL - Abnormal; Notable for the following components:      Result Value   Glucose, Bld 106 (*)    GFR calc non Af Amer 53 (*)    All other components within normal limits  SARS CORONAVIRUS 2 (TAT 6-24 HRS)  CBC WITH DIFFERENTIAL/PLATELET  URINALYSIS, ROUTINE W REFLEX MICROSCOPIC    EKG EKG Interpretation  Date/Time:  Wednesday December 20 2019 11:44:24 EDT Ventricular Rate:  81 PR Interval:    QRS Duration: 124 QT Interval:  410 QTC Calculation: 476 R Axis:   -47 Text Interpretation: Sinus rhythm Left bundle branch block Since last tracing axis has reverted to prior state Otherwise no significant change Confirmed by Daleen Bo [phone]) on 12/20/2019 12:04:12 PM   Radiology DG Chest 2 View  Result Date: 12/20/2019 CLINICAL DATA:  Weakness EXAM: CHEST - 2 VIEW COMPARISON:  None. FINDINGS: The heart size and mediastinal contours are within normal limits. Atherosclerotic calcification of the aortic knob. No focal airspace consolidation, pleural effusion, or pneumothorax. Degenerative changes of the shoulders, left worse than right. IMPRESSION: No active cardiopulmonary disease. Electronically Signed   By: Davina Poke D.O.   On: 12/20/2019 12:23   MR BRAIN WO CONTRAST  Result Date: 12/20/2019 CLINICAL DATA:  Fatigue, ataxia EXAM: MRI HEAD WITHOUT CONTRAST TECHNIQUE: Multiplanar, multiecho pulse sequences of the brain and surrounding structures were obtained  without intravenous contrast. COMPARISON:  None FINDINGS: Brain: There is a small acute infarction involving  the left caudate body, posterior lentiform nucleus, and intervening white matter. Patchy and confluent areas of T2 hyperintensity in the supratentorial white matter are nonspecific but may reflect moderate chronic microvascular ischemic changes. There is no intracranial mass, mass effect, or edema. There is no hydrocephalus or extra-axial fluid collection. Prominence of the ventricles and sulci reflects mild generalized parenchymal volume loss. Vascular: Major vessel flow voids at the skull base are preserved. Skull and upper cervical spine: Normal marrow signal is preserved. Sinuses/Orbits: Trace mucosal thickening. Bilateral lens replacements. Other: Sella is unremarkable.  Mastoid air cells are clear. IMPRESSION: Acute infarction involving the left basal ganglia and adjacent white matter. Moderate chronic microvascular ischemic changes. Electronically Signed   By: Macy Mis M.D.   On: 12/20/2019 15:37    Procedures .Critical Care Performed by: Delia Heady, PA-C Authorized by: Delia Heady, PA-C   Critical care provider statement:    Critical care time (minutes):  35   Critical care time was exclusive of:  Separately billable procedures and treating other patients   Critical care was necessary to treat or prevent imminent or life-threatening deterioration of the following conditions:  Cardiac failure, circulatory failure and CNS failure or compromise   Critical care was time spent personally by me on the following activities:  Development of treatment plan with patient or surrogate, discussions with consultants, evaluation of patient's response to treatment, examination of patient, obtaining history from patient or surrogate, ordering and performing treatments and interventions, ordering and review of laboratory studies, ordering and review of radiographic studies, re-evaluation of patient's condition and review of old charts   I assumed direction of critical care for this patient from another  provider in my specialty: no     (including critical care time)  Medications Ordered in ED Medications - No data to display  ED Course  I have reviewed the triage vital signs and the nursing notes.  Pertinent labs & imaging results that were available during my care of the patient were reviewed by me and considered in my medical decision making (see chart for details).    MDM Rules/Calculators/A&P                      86 year old female with a past medical history of hypertension, IBS, anxiety presenting to the ED with a chief complaint of generalized weakness in bilateral lower extremities.  Symptoms began approximately 2 days ago.  Does have some chronic lower back pain, denies any injuries or falls.  Patient being treated for a MRSA skin infection and completed antibiotic course about a week ago.  On exam patient is alert and oriented x4.  She has some abnormal finger-to-nose coordination on the right side but otherwise no other abnormalities on her neurological exam.  She is afebrile.  Denies any chest pain.  Work-up here significant for unremarkable lab work and EKG.  Chest x-ray is unremarkable.  MRI of the brain shows acute infarct of the left basal ganglia. I have consulted Dr. Lorraine Lax of neurology who will evaluate the patient in consult.  He asked that we admit to medicine service. Patient declined with these results.  She has passed her swallow screen.  MDM Number of Diagnoses or Management Options Cerebrovascular accident (CVA), unspecified mechanism ([place]): new, needed workup   Amount and/or Complexity of Data Reviewed Clinical lab tests: ordered and reviewed Tests in the radiology section of  CPT: ordered and reviewed Review and summarize past medical records: yes Discuss the patient with other providers: yes Independent visualization of images, tracings, or specimens: yes  Risk of Complications, Morbidity, and/or Mortality Presenting problems: high Diagnostic procedures:  high Management options: high  Patient Progress Patient progress: stable   Final Clinical Impression(s) / ED Diagnoses Final diagnoses:  Cerebrovascular accident (CVA), unspecified mechanism ([place])    Rx / DC Orders ED Discharge Orders    None      Portions of this note were generated with Dragon dictation software. Dictation errors may occur despite best attempts at proofreading.    Delia Heady, PA-C 12/20/19 1632    Daleen Bo, MD 12/21/19 2154

## 2019-12-20 NOTE — ED Notes (Signed)
Pt transported to MRI 

## 2019-12-20 NOTE — H&P (Signed)
History and Physical    JALEXY Escobar [id number] DOB: Feb 12, 1930 DOA: 12/20/2019  PCP: Haywood Pao, MD  Patient coming from: Home  I have personally briefly reviewed patient's old medical records in [place]  Chief Complaint: My legs are weak  HPI: Christina Escobar is a 86 y.o. female with medical history significant of hypertension, IBS, anxiety presented to the ED with worsening weakness in bilateral lower extremities.  Symptoms started about 3 days ago, feels only weakness of her legs, no feeling of numbness or tingling, no weakness on arms.  She does have some chronic lower back pain, and she felt it was her back problems causing her leg weakness.  Her last 2 days her mobility reduced to walker dependent (belongs to her husband). She was treated for a MRSA skin infection and completed antibiotics about a week ago.  She denies any headache, numbness in arms or legs, fever, chest pain, abdominal pain, vomiting, diarrhea.  She said she has been on same blood pressure medication for more than 20 years, and she has been checking her blood pressure every day for last month which was recommended by visiting nurse.  And her blood pressure has been fairly controlled. ED Course: MRI showed left basal ganglia acute infarct  Review of Systems: As per HPI otherwise 10 point review of systems negative.    Past Medical History:  Diagnosis Date  . Allergy   . Anal fissure   . Anxiety   . Basal cell carcinoma   . Cataracts, bilateral   . Clostridium difficile diarrhea   . Depression   . Diverticulosis    hx of stricture in colon  . Hemorrhoids   . Hypertension   . IBS (irritable bowel syndrome)   . Insomnia   . Migraine   . Osteopenia   . Thrombosed hemorrhoids     Past Surgical History:  Procedure Laterality Date  . APPENDECTOMY    . BASAL CELL CARCINOMA EXCISION  [id number]  . CATARACT EXTRACTION, BILATERAL Bilateral 1999  . COLON RESECTION  2001  . COLONOSCOPY  WITH PROPOFOL N/A 10/22/2016   Procedure: COLONOSCOPY WITH PROPOFOL;  Surgeon: Milus Banister, MD;  Location: [facility];  Service: Endoscopy;  Laterality: N/A;  . FECAL TRANSPLANT N/A 10/22/2016   Procedure: FECAL TRANSPLANT;  Surgeon: Milus Banister, MD;  Location: [facility];  Service: Endoscopy;  Laterality: N/A;  . INCISION AND DRAINAGE  11/05/2011   thrombosed hemorrhoid  . TRANSANAL EXCISION OF RECTAL MASS WITH HEMORRHOID INJECTION  2007   anal fissure     reports that she has never smoked. She has never used smokeless tobacco. She reports current alcohol use. She reports that she does not use drugs.  Allergies  Allergen Reactions  . Adhesive [Tape] Other (See Comments)    Caused SEVERE skin irritation  . Cefdinir Other (See Comments)    Caused C-Diff  . Amitiza [Lubiprostone] Diarrhea  . Erythromycin Nausea And Vomiting  . Flagyl [Metronidazole] Diarrhea  . Meloxicam Other (See Comments)    Dizziness resulted after taking it     Family History  Problem Relation Age of Onset  . Alzheimer's disease Mother   . Heart failure Mother   . Heart disease Father   . Heart failure Father   . Other Maternal Grandfather        cerebral hemorrhage  . Skin cancer Brother   . Heart disease Brother   . Colon cancer Neg Hx   . Stomach  cancer Neg Hx   . Pancreatic cancer Neg Hx      Prior to Admission medications   Medication Sig Start Date End Date Taking? Authorizing Provider  acetaminophen (TYLENOL) 325 MG tablet Take 325-650 mg by mouth every 8 (eight) hours as needed for mild pain or headache.    Yes [provider]  amLODipine (NORVASC) 5 MG tablet Take 5 mg by mouth daily.   Yes [provider]  Calcium Carbonate-Vitamin D3 (CALCIUM 600-D) 600-400 MG-UNIT TABS Take 1 tablet by mouth daily.    Yes [provider]  fluticasone (FLONASE) 50 MCG/ACT nasal spray Place 1 spray into both nostrils at bedtime.   Yes [provider]  loperamide  (IMODIUM A-D) 2 MG tablet Take 2 mg by mouth 4 (four) times daily as needed for diarrhea or loose stools.   Yes [provider]  loratadine (CLARITIN) 10 MG tablet Take 10 mg by mouth daily as needed for allergies or rhinitis.    Yes [provider]  LORazepam (ATIVAN) 0.5 MG tablet Take 1 tablet (0.5 mg total) by mouth every evening. Pt takes this dose at 5pm. Patient taking differently: Take 0.5 mg by mouth See admin instructions. Take 0.5 mg by mouth once a day at 5 PM 09/13/19  Yes Cottle, Billey Co., MD  LORazepam (ATIVAN) 1 MG tablet Take 1 tablet (1 mg total) by mouth 2 (two) times daily at 8 am and 10 pm. 09/13/19  Yes Cottle, Billey Co., MD  Methylcellulose, Laxative, (CITRUCEL PO) Take by mouth See admin instructions. Mix 4.5 heaping teaspoonsful of powder into 8 ounces of water and drink once a day at bedtime   Yes [provider]  mirtazapine (REMERON) 45 MG tablet Take 1 tablet (45 mg total) by mouth at bedtime. 03/03/19  Yes Cottle, Billey Co., MD  Multiple Vitamin (MULTIVITAMIN WITH MINERALS) TABS tablet Take 1 tablet by mouth daily.   Yes [provider]  mupirocin ointment (BACTROBAN) 2 % Apply 1 application topically See admin instructions. Apply to the right arm 2 times a day 12/04/19  Yes [provider]  phenylephrine-shark liver oil-mineral oil-petrolatum (PREPARATION H) 0.25-14-74.9 % rectal ointment Place 1 application rectally 2 (two) times daily as needed for hemorrhoids.   Yes [provider]  quinapril (ACCUPRIL) 40 MG tablet Take 40 mg by mouth in the morning.    Yes [provider]  SUMAtriptan (IMITREX) 25 MG tablet Take 25 mg by mouth once as needed for migraine.    Yes [provider]  zolpidem (AMBIEN) 5 MG tablet Take 1 tablet (5 mg total) by mouth at bedtime as needed for sleep. 09/13/19  Yes Cottle, Billey Co., MD  simethicone (MYLICON) 0000000 MG chewable tablet Chew 125 mg by mouth every 6 (six)  hours as needed for flatulence.    [provider]    Physical Exam: Vitals:   12/20/19 1300 12/20/19 1400 12/20/19 1600 12/20/19 1930  BP: (!) 144/77 135/72 135/63 127/65  Pulse: 78 77 72 72  Resp: 18 18 18 18   Temp:      TempSrc:      SpO2: 99% 98% 93% 96%  Weight:      Height:        Constitutional: NAD, calm, comfortable Vitals:   12/20/19 1300 12/20/19 1400 12/20/19 1600 12/20/19 1930  BP: (!) 144/77 135/72 135/63 127/65  Pulse: 78 77 72 72  Resp: 18 18 18 18   Temp:  TempSrc:      SpO2: 99% 98% 93% 96%  Weight:      Height:       Eyes: PERRL, lids and conjunctivae normal ENMT: Mucous membranes are moist. Posterior pharynx clear of any exudate or lesions.Normal dentition.  Neck: normal, supple, no masses, no thyromegaly Respiratory: clear to auscultation bilaterally, no wheezing, no crackles. Normal respiratory effort. No accessory muscle use.  Cardiovascular: Regular rate and rhythm, no murmurs / rubs / gallops. No extremity edema. 2+ pedal pulses. No carotid bruits.  Abdomen: no tenderness, no masses palpated. No hepatosplenomegaly. Bowel sounds positive.  Musculoskeletal: no clubbing / cyanosis. No joint deformity upper and lower extremities. Good ROM, no contractures. Normal muscle tone.  Skin: no rashes, lesions, ulcers. No induration Neurologic: CN 2-12 grossly intact. Sensation intact, DTR normal. Strength 4/5 on bilateral legs, clumsy movement bilateral heel shin Psychiatric: Normal judgment and insight. Alert and oriented x 3. Normal mood.     Labs on Admission: I have personally reviewed following labs and imaging studies  CBC: Recent Labs  Lab 12/20/19 1140  WBC 5.7  NEUTROABS 3.1  HGB 12.6  HCT 38.6  MCV 93.9  PLT [id number]   Basic Metabolic Panel: Recent Labs  Lab 12/20/19 1140  NA 138  K 4.4  CL 102  CO2 26  GLUCOSE 106*  BUN 12  CREATININE 0.96  CALCIUM 9.9   GFR: Estimated Creatinine Clearance: 31.2 mL/min (by C-G formula  based on SCr of 0.96 mg/dL). Liver Function Tests: Recent Labs  Lab 12/20/19 1140  AST 32  ALT 16  ALKPHOS 71  BILITOT 0.7  PROT 7.0  ALBUMIN 4.0   No results for input(s): LIPASE, AMYLASE in the last 168 hours. No results for input(s): AMMONIA in the last 168 hours. Coagulation Profile: No results for input(s): INR, PROTIME in the last 168 hours. Cardiac Enzymes: No results for input(s): CKTOTAL, CKMB, CKMBINDEX, TROPONINI in the last 168 hours. BNP (last 3 results) No results for input(s): PROBNP in the last 8760 hours. HbA1C: No results for input(s): HGBA1C in the last 72 hours. CBG: No results for input(s): GLUCAP in the last 168 hours. Lipid Profile: No results for input(s): CHOL, HDL, LDLCALC, TRIG, CHOLHDL, LDLDIRECT in the last 72 hours. Thyroid Function Tests: No results for input(s): TSH, T4TOTAL, FREET4, T3FREE, THYROIDAB in the last 72 hours. Anemia Panel: No results for input(s): VITAMINB12, FOLATE, FERRITIN, TIBC, IRON, RETICCTPCT in the last 72 hours. Urine analysis:    Component Value Date/Time   COLORURINE YELLOW 12/20/2019 1140   APPEARANCEUR CLEAR 12/20/2019 1140   LABSPEC 1.008 12/20/2019 1140   PHURINE 8.0 12/20/2019 1140   GLUCOSEU NEGATIVE 12/20/2019 1140   GLUCOSEU NEGATIVE 06/02/2018 1432   HGBUR NEGATIVE 12/20/2019 1140   BILIRUBINUR NEGATIVE 12/20/2019 1140   KETONESUR NEGATIVE 12/20/2019 1140   PROTEINUR NEGATIVE 12/20/2019 1140   UROBILINOGEN 0.2 06/02/2018 1432   NITRITE NEGATIVE 12/20/2019 1140   LEUKOCYTESUR NEGATIVE 12/20/2019 1140    Radiological Exams on Admission: DG Chest 2 View  Result Date: 12/20/2019 CLINICAL DATA:  Weakness EXAM: CHEST - 2 VIEW COMPARISON:  None. FINDINGS: The heart size and mediastinal contours are within normal limits. Atherosclerotic calcification of the aortic knob. No focal airspace consolidation, pleural effusion, or pneumothorax. Degenerative changes of the shoulders, left worse than right. IMPRESSION:  No active cardiopulmonary disease. Electronically Signed   By: Davina Poke D.O.   On: 12/20/2019 12:23   MR BRAIN WO CONTRAST  Result Date: 12/20/2019 CLINICAL DATA:  Fatigue, ataxia EXAM: MRI HEAD WITHOUT CONTRAST TECHNIQUE: Multiplanar, multiecho pulse sequences of the brain and surrounding structures were obtained without intravenous contrast. COMPARISON:  None FINDINGS: Brain: There is a small acute infarction involving the left caudate body, posterior lentiform nucleus, and intervening white matter. Patchy and confluent areas of T2 hyperintensity in the supratentorial white matter are nonspecific but may reflect moderate chronic microvascular ischemic changes. There is no intracranial mass, mass effect, or edema. There is no hydrocephalus or extra-axial fluid collection. Prominence of the ventricles and sulci reflects mild generalized parenchymal volume loss. Vascular: Major vessel flow voids at the skull base are preserved. Skull and upper cervical spine: Normal marrow signal is preserved. Sinuses/Orbits: Trace mucosal thickening. Bilateral lens replacements. Other: Sella is unremarkable.  Mastoid air cells are clear. IMPRESSION: Acute infarction involving the left basal ganglia and adjacent white matter. Moderate chronic microvascular ischemic changes. Electronically Signed   By: Macy Mis M.D.   On: 12/20/2019 15:37    EKG: Independently reviewed. LBBB chronity unknown  Assessment/Plan Active Problems:   CVA (cerebral vascular accident) ([place])  Acute left basal ganglia CVA We will start aspirin and Lipitor, check A1c and lipid panel Cardiac Doppler, complete echo Neurology Dr. Malen Gauze will see the patient tonight Allow permissive hypertension for 2 to 3 days PT evaluation  HTN Hold home BP meds, as needed BP meds only  Anxiety Continue benzo and SSRI   DVT prophylaxis: Heparin subcu  code Status: Full code Family Communication: Husband at bedside Disposition Plan: Likely  will need rehab Consults called: Neurology Admission status: Telemetry admission   Lequita Halt MD [facility] Pager [phone]    12/20/2019, 7:44 PM

## 2019-12-20 NOTE — Consult Note (Signed)
Neurology Consultation  Reason for Consult: stroke on MRI - gait difficulty Referring Physician: Dr/ Eulis Foster. EDP  CC: gait difficulty  History is obtained from: Pt, chart  HPI: Christina Escobar is a 86 y.o. female with past medical history of migraine headaches, hypertension, depression, anxiety, C. difficile who presented to the hospital secondary to noticing on Monday morning that she was having difficulty with her gait.  Neurology was consulted secondary to MRI showing a left basal ganglia acute infarct.  Patient states that she went to bed on Sunday night and she was normal.  She woke up at 4 AM on Monday morning to go to the bathroom and noticed that she was very much off balanced.  She states that she felt both legs were off balance.  She did go to the PA at her facility who thought etiology was in the back.  At that point she ordered a x-ray of her back.  Apparently on Tuesday they finally got the results which were negative.  This a.m. the husband decided that given there is no resolution to her symptoms that she should come to the ED.  Currently she still feels weak in her legs bilaterally. No focal weakness. No facial weakness. No speech problems. No visual symptoms. Had migraines years ago but not now. No CP, SOB, fevers or sick contacts. Reports chronic fatigue but that is ongoing for many years. No new changes to that.  Patient states that she does not take aspirin but does take Tylenol.  ED course  Relevant labs include -blood glucose of 106, CBC within normal limits MRI brain shows-acute infarction involving the left basal ganglia and adjacent white matter.  Chart review (sees Dr. Rexene Alberts as an outpatient for migraine headaches)  LKW: 2200 hrs. on 12/17/2019 tpa given?: no, out of window Premorbid modified Rankin scale (mRS): 1 NIH stroke score: 1   Past Medical History:  Diagnosis Date  . Allergy   . Anal fissure   . Anxiety   . Basal cell carcinoma   . Cataracts,  bilateral   . Clostridium difficile diarrhea   . Depression   . Diverticulosis    hx of stricture in colon  . Hemorrhoids   . Hypertension   . IBS (irritable bowel syndrome)   . Insomnia   . Migraine   . Osteopenia   . Thrombosed hemorrhoids     Family History  Problem Relation Age of Onset  . Alzheimer's disease Mother   . Heart failure Mother   . Heart disease Father   . Heart failure Father   . Other Maternal Grandfather        cerebral hemorrhage  . Skin cancer Brother   . Heart disease Brother   . Colon cancer Neg Hx   . Stomach cancer Neg Hx   . Pancreatic cancer Neg Hx    Social History:   reports that she has never smoked. She has never used smokeless tobacco. She reports current alcohol use. She reports that she does not use drugs.  Medications  Current Facility-Administered Medications:  .   stroke: mapping our early stages of recovery book, , Does not apply, Once, Wynetta Fines T, MD .  acetaminophen (TYLENOL) tablet 650 mg, 650 mg, Oral, Q4H PRN **OR** [DISCONTINUED] acetaminophen (TYLENOL) 160 MG/5ML solution 650 mg, 650 mg, Per Tube, Q4H PRN **OR** [DISCONTINUED] acetaminophen (TYLENOL) suppository 650 mg, 650 mg, Rectal, Q4H PRN, Wynetta Fines T, MD .  Derrill Memo ON 12/21/2019] aspirin EC tablet 81 mg,  81 mg, Oral, Daily, Wynetta Fines T, MD .  aspirin tablet 325 mg, 325 mg, Oral, Once, Wynetta Fines T, MD .  atorvastatin (LIPITOR) tablet 40 mg, 40 mg, Oral, q1800, Wynetta Fines T, MD .  calcium carbonate (TUMS - dosed in mg elemental calcium) chewable tablet 200 mg of elemental calcium, 1 tablet, Oral, Daily, Zhang, Pearletha Forge T, MD .  Calcium Carbonate-Vitamin D3 600-400 MG-UNIT TABS 1 tablet, 1 tablet, Oral, Daily, Wynetta Fines T, MD .  heparin injection 5,000 Units, 5,000 Units, Subcutaneous, Q12H, Zhang, Ping T, MD .  hydrALAZINE (APRESOLINE) tablet 25 mg, 25 mg, Oral, Q6H PRN, Wynetta Fines T, MD .  loratadine (CLARITIN) tablet 10 mg, 10 mg, Oral, Daily, Zhang, Ping T, MD .   LORazepam (ATIVAN) tablet 1 mg, 1 mg, Oral, BID AC & HS, Zhang, Ralene Cork, MD .  methylcellulose oral powder POWD 1 packet, 1 packet, Oral, Daily, Wynetta Fines T, MD .  mirtazapine (REMERON) tablet 45 mg, 45 mg, Oral, QHS, Zhang, Pearletha Forge T, MD .  multivitamin with minerals tablet 1 tablet, 1 tablet, Oral, Daily, Zhang, Ping T, MD .  mupirocin ointment (BACTROBAN) 2 % 1 application, 1 application, Topical, Daily, Zhang, Ping T, MD .  senna-docusate (Senokot-S) tablet 1 tablet, 1 tablet, Oral, QHS PRN, Wynetta Fines T, MD .  simethicone [facility]) chewable tablet 80 mg, 80 mg, Oral, QID PRN, Wynetta Fines T, MD .  SUMAtriptan (IMITREX) tablet 25 mg, 25 mg, Oral, Q2H PRN, Wynetta Fines T, MD .  zolpidem (AMBIEN) tablet 5 mg, 5 mg, Oral, QHS PRN, Lequita Halt, MD  Current Outpatient Medications:  .  acetaminophen (TYLENOL) 500 MG tablet, Take 1,000 mg by mouth 2 (two) times daily. , Disp: , Rfl:  .  amLODipine (NORVASC) 5 MG tablet, Take 5 mg by mouth daily., Disp: , Rfl:  .  calcium carbonate (TUMS - DOSED IN MG ELEMENTAL CALCIUM) 500 MG chewable tablet, Chew 1 tablet by mouth daily., Disp: , Rfl:  .  Calcium Carbonate-Vitamin D3 (CALCIUM 600-D) 600-400 MG-UNIT TABS, Take 1 tablet by mouth daily. , Disp: , Rfl:  .  loratadine (CLARITIN) 10 MG tablet, Take 10 mg by mouth daily., Disp: , Rfl:  .  LORazepam (ATIVAN) 0.5 MG tablet, Take 1 tablet (0.5 mg total) by mouth every evening. Pt takes this dose at 5pm., Disp: 30 tablet, Rfl: 5 .  LORazepam (ATIVAN) 1 MG tablet, Take 1 tablet (1 mg total) by mouth 2 (two) times daily at 8 am and 10 pm., Disp: 60 tablet, Rfl: 5 .  methylcellulose (CITRUCEL) oral powder, Take 1 packet by mouth daily. , Disp: , Rfl:  .  mirtazapine (REMERON) 45 MG tablet, Take 1 tablet (45 mg total) by mouth at bedtime., Disp: 30 tablet, Rfl: 11 .  Multiple Vitamin (MULTIVITAMIN WITH MINERALS) TABS tablet, Take 1 tablet by mouth daily., Disp: , Rfl:  .  mupirocin ointment (BACTROBAN) 2 %, Apply  1 application topically daily., Disp: , Rfl:  .  quinapril (ACCUPRIL) 40 MG tablet, Take 40 mg by mouth Daily. , Disp: , Rfl:  .  Simethicone (GAS-X PO), Take 1 tablet by mouth daily. , Disp: , Rfl:  .  SUMAtriptan (IMITREX) 25 MG tablet, Take 25 mg by mouth every 2 (two) hours as needed for migraine. , Disp: , Rfl:  .  zolpidem (AMBIEN) 5 MG tablet, Take 1 tablet (5 mg total) by mouth at bedtime as needed for sleep., Disp: 30 tablet, Rfl: 1  ROS:  General ROS: negative for - chills, fatigue, fever, night sweats, weight gain or weight loss Psychological ROS: negative for - behavioral disorder, hallucinations, memory difficulties, mood swings or suicidal ideation Ophthalmic ROS: negative for - blurry vision, double vision, eye pain or loss of vision ENT ROS: negative for - epistaxis, nasal discharge, oral lesions, sore throat, tinnitus or vertigo Allergy and Immunology ROS: negative for - hives or itchy/watery eyes Hematological and Lymphatic ROS: negative for - bleeding problems, bruising or swollen lymph nodes Endocrine ROS: negative for - galactorrhea, hair pattern changes, polydipsia/polyuria or temperature intolerance Respiratory ROS: negative for - cough, hemoptysis, shortness of breath or wheezing Cardiovascular ROS: negative for - chest pain, dyspnea on exertion, edema or irregular heartbeat Gastrointestinal ROS: negative for - abdominal pain, diarrhea, hematemesis, nausea/vomiting or stool incontinence Genito-Urinary ROS: negative for - dysuria, hematuria, incontinence or urinary frequency/urgency Musculoskeletal ROS: Positive for -  muscular weakness and back pain Neurological ROS: as noted in HPI Dermatological ROS: negative for rash and skin lesion changes  Exam: Current vital signs: BP 135/63 (BP Location: Right Arm)   Pulse 72   Temp 97.6 F (36.4 C) (Oral)   Resp 18   Ht 5\' 1"  (1.549 m)   Wt 49.9 kg   SpO2 93%   BMI 20.78 kg/m  Vital signs in last 24 hours: Temp:   [97.6 F (36.4 C)] 97.6 F (36.4 C) (04/07 1132) Pulse Rate:  [72-87] 72 (04/07 1600) Resp:  [15-20] 18 (04/07 1600) BP: (125-147)/(59-81) 135/63 (04/07 1600) SpO2:  [93 %-100 %] 93 % (04/07 1600) Weight:  [49.9 kg] 49.9 kg (04/07 1125) Constitutional: Appears well-developed and well-nourished.  Psych: Affect appropriate to situation Eyes: No scleral injection HENT: No OP obstrucion Head: Normocephalic.  Cardiovascular: Normal rate and regular rhythm.  Respiratory: Effort normal, non-labored breathing GI: Soft.  No distension. There is no tenderness.  Skin: WDI  Neuro: Mental Status: Patient is awake, alert, oriented to person, place, month, year, and situation. Speech-intact naming, repeating, comprehension with no dysarthria or aphasia Patient is able to give a clear and coherent history. Cranial Nerves: II: Visual Fields are full.  III,IV, VI: EOMI without ptosis or diploplia. Pupils equal, round and reactive to light V: Facial sensation is symmetric to temperature VII: Facial movement is symmetric.  VIII: hearing is intact to voice X: Palat elevates symmetrically XI: Shoulder shrug is symmetric. XII: tongue is midline without atrophy or fasciculations.  Motor: 5/5 strength with left bicep flexion and leg strength.  Bilateral 4/5 tricep extension.  Right bicep flexion and shoulder abduction 4+/5.  Right leg hip flexion knee flexion and extension 4+/5 with 5/5 dorsi and plantar flexion-no vertical drift Drift negative  Sensory: Sensation is symmetric to light touch and temperature in the arms and legs. No extinction to DSS Cerebellar: Subtly dysmetric on RLE Gait not tested for safety  Labs I have reviewed labs in epic and the results pertinent to this consultation are: CBC    Component Value Date/Time   WBC 5.7 12/20/2019 1140   RBC 4.11 12/20/2019 1140   HGB 12.6 12/20/2019 1140   HCT 38.6 12/20/2019 1140   PLT 244 12/20/2019 1140   MCV 93.9 12/20/2019 1140    MCH 30.7 12/20/2019 1140   MCHC 32.6 12/20/2019 1140   RDW 11.5 12/20/2019 1140   LYMPHSABS 2.2 12/20/2019 1140   MONOABS 0.4 12/20/2019 1140   EOSABS 0.1 12/20/2019 1140   BASOSABS 0.1 12/20/2019 1140    CMP     Component  Value Date/Time   NA 138 12/20/2019 1140   K 4.4 12/20/2019 1140   CL 102 12/20/2019 1140   CO2 26 12/20/2019 1140   GLUCOSE 106 (H) 12/20/2019 1140   BUN 12 12/20/2019 1140   CREATININE 0.96 12/20/2019 1140   CALCIUM 9.9 12/20/2019 1140   PROT 7.0 12/20/2019 1140   ALBUMIN 4.0 12/20/2019 1140   AST 32 12/20/2019 1140   ALT 16 12/20/2019 1140   ALKPHOS 71 12/20/2019 1140   BILITOT 0.7 12/20/2019 1140   GFRNONAA 53 (L) 12/20/2019 1140   GFRAA >60 12/20/2019 1140   Imaging I have reviewed the images obtained: MRI examination of the brain-left BG infarct  Etta Quill PA-C [facility] [phone]  M-F  (9:00 am- 5:00 PM)  12/20/2019, 5:38 PM   Assessment:  86 year old female presented to the hospital with gait distrubance and exam shows subtle right hemiparesis and MRI with acute left basal ganglia infarct. LKW Sunday 12/17/2019 - OSW for tPA Stroke likely small vessel etiology and not LVO etiology hence not a candidate for EVT.  Impression: -Right-sided weakness, Left BG infarct  Recommend -CTA of head and neck -Transthoracic Echo,   -Start patient on ASA 325mg  daily,  -Start or continue Atorvastatin 80 mg/other high intensity statin -BP goal: Normotensive with systolic blood pressure less than 140 -HBAIC and Lipid profile -Telemetry monitoring -Frequent neuro checks -NPO until passes stroke swallow screen -PT/OT/ST # please page stroke NP  Or  PA  Or MD from 8am -4 pm  as this patient from this time will be  followed by the stroke.   You can look them up on [email or web address]  [place]   Attending Neurohospitalist Addendum Patient seen and examined with APP/Resident. Agree with the history and physical as documented  above. Agree with the plan as documented, which I helped formulate. I have independently reviewed the chart, obtained history, review of systems and examined the patient.I have personally reviewed pertinent head/neck/spine imaging (CT/MRI). Please feel free to call with any questions. --- Amie Portland, MD [facility] Pager: [phone]  If 7pm to 7am, please call on call as listed on AMION.

## 2019-12-20 NOTE — ED Triage Notes (Signed)
Correction 0400 Monday, not pm

## 2019-12-20 NOTE — ED Provider Notes (Signed)
  Face-to-face evaluation   History: Patient presents for evaluation of fatigue and "my legs are not working," preventing her from being able to walk her dog.  Onset, suddenly, 2 days ago and persistent.  She saw her PCP who did some imaging of her lumbar spine, plain images that showed degenerative changes.  Physical exam: Elderly, alert and cooperative.  Normal strength, arms and legs, bilaterally while supine.  Abnormal finger-to-nose and heel-to-shin on right as compared to left, consistent with central nervous system abnormality.  Medical screening examination/treatment/procedure(s) were conducted as a shared visit with non-physician practitioner(s) and myself.  I personally evaluated the patient during the encounter    Daleen Bo, MD 12/21/19 2154

## 2019-12-21 ENCOUNTER — Encounter (HOSPITAL_COMMUNITY): Payer: Self-pay | Admitting: Internal Medicine

## 2019-12-21 ENCOUNTER — Inpatient Hospital Stay (HOSPITAL_COMMUNITY): Payer: Medicare Other

## 2019-12-21 ENCOUNTER — Other Ambulatory Visit: Payer: Self-pay

## 2019-12-21 ENCOUNTER — Encounter (HOSPITAL_COMMUNITY): Payer: Medicare Other

## 2019-12-21 DIAGNOSIS — E782 Mixed hyperlipidemia: Secondary | ICD-10-CM

## 2019-12-21 DIAGNOSIS — F419 Anxiety disorder, unspecified: Secondary | ICD-10-CM

## 2019-12-21 DIAGNOSIS — I63032 Cerebral infarction due to thrombosis of left carotid artery: Secondary | ICD-10-CM

## 2019-12-21 DIAGNOSIS — R2681 Unsteadiness on feet: Secondary | ICD-10-CM

## 2019-12-21 DIAGNOSIS — I6389 Other cerebral infarction: Secondary | ICD-10-CM

## 2019-12-21 DIAGNOSIS — I10 Essential (primary) hypertension: Secondary | ICD-10-CM

## 2019-12-21 LAB — LIPID PANEL
Cholesterol: 209 mg/dL — ABNORMAL HIGH (ref 0–200)
HDL: 93 mg/dL (ref >40)
LDL Cholesterol: 102 mg/dL — ABNORMAL HIGH (ref 0–99)
Total CHOL/HDL Ratio: 2.2 RATIO
Triglycerides: 72 mg/dL (ref <150)
VLDL: 14 mg/dL (ref 0–40)

## 2019-12-21 LAB — ECHOCARDIOGRAM COMPLETE
Height: 61 in
Weight: 1760 oz

## 2019-12-21 MED ORDER — IOHEXOL 350 MG/ML SOLN
75.0000 mL | Freq: Once | INTRAVENOUS | Status: AC | PRN
  Administered 2019-12-21: 75 mL via INTRAVENOUS

## 2019-12-21 NOTE — Progress Notes (Signed)
PT Cancellation Note  Patient Details Name: Christina Escobar MRN: [id number] DOB: 1929-10-18   Cancelled Treatment:    Reason Eval/Treat Not Completed: Patient at procedure or test/unavailable - going for CT and vascular lab. Will check back later today.   [place] Pager [phone]  Office [phone]    Roxine Caddy D Elonda Husky 12/21/2019, 9:37 AM

## 2019-12-21 NOTE — Progress Notes (Signed)
  Echocardiogram 2D Echocardiogram has been performed.  Christina Escobar 12/21/2019, 11:34 AM

## 2019-12-21 NOTE — Plan of Care (Signed)
  Problem: Nutrition: Goal: Dietary intake will improve Outcome: Progressing   Problem: Education: Goal: Knowledge of General Education information will improve Description: Including pain rating scale, medication(s)/side effects and non-pharmacologic comfort measures Outcome: Progressing

## 2019-12-21 NOTE — Evaluation (Signed)
Physical Therapy Evaluation Patient Details Name: ARILYNN WEINRICH MRN: [id number] DOB: Mar 01, 1930 Today's Date: 12/21/2019   History of Present Illness  86 y.o. female with history of migraine headaches, hypertension, depression, anxiety, C. difficile presenting with gait difficulties. MRI  L posterior BG and CR infarct.   Clinical Impression   Pt presents with generalized weakness, unsteadiness in standing, RUE and LE incoordination, decreased activity tolerance, and impaired gait. Pt to benefit from acute PT to address deficits. Pt ambulated room distance with light steadying assist initially via RW, transitioning to HHA. Pt's husband is present during session, uses RW and will not be able to physically assist pt if needed. PT recommending SNF post-acutely, pt with preference for SNF attached to [facility]. PT to progress mobility as tolerated, and will continue to follow acutely.      Follow Up Recommendations SNF([facility], where pt and husband live in [place])    Equipment Recommendations  None recommended by PT    Recommendations for Other Services       Precautions / Restrictions Precautions Precautions: Fall      Mobility  Bed Mobility Overal bed mobility: Needs Assistance             General bed mobility comments: pt sitting EOB upon arrival to room.  Transfers Overall transfer level: Needs assistance Equipment used: Rolling walker (2 wheeled) Transfers: Sit to/from Stand Sit to Stand: Min assist;From elevated surface         General transfer comment: min assist for steadying upon standing, verbal cuing for hand placement when rising not followed.  Ambulation/Gait Ambulation/Gait assistance: Min assist;+2 safety/equipment Gait Distance (Feet): 25 Feet Assistive device: Rolling walker (2 wheeled);1 person hand held assist Gait Pattern/deviations: Step-through pattern;Decreased stride length;Narrow base of support;Trunk flexed Gait velocity:  decr   General Gait Details: min assist for steadying with use of RW, min assist for HHA without RW use. Verbal cuing for upright posture x2, widening BOS. + for R trendelenburg gait  Stairs            Wheelchair Mobility    Modified Rankin (Stroke Patients Only) Modified Rankin (Stroke Patients Only) Pre-Morbid Rankin Score: No significant disability Modified Rankin: Moderately severe disability     Balance Overall balance assessment: Needs assistance Sitting-balance support: No upper extremity supported;Feet supported Sitting balance-Leahy Scale: Good     Standing balance support: Single extremity supported Standing balance-Leahy Scale: Poor Standing balance comment: needs at least SL UE support during dynamic standing                             Pertinent Vitals/Pain Pain Assessment: No/denies pain    Home Living Family/patient expects to be discharged to:: Private residence Living Arrangements: Spouse/significant other Available Help at Discharge: Family;Available 24 hours/day Type of Home: Independent living facility([facility]) Home Access: Level entry     Home Layout: One level Home Equipment: [place] - 2 wheels;Cane - single point      Prior Function Level of Independence: Independent               Hand Dominance   Dominant Hand: Right    Extremity/Trunk Assessment   Upper Extremity Assessment Upper Extremity Assessment: RUE deficits/detail RUE Deficits / Details: states she has more difficulty with her writing, mild incoordination noted with RAM testing RUE Coordination: decreased fine motor    Lower Extremity Assessment Lower Extremity Assessment: Generalized weakness;RLE deficits/detail RLE Deficits /  Details: mild incoordination heel--to-shin test, R trendelenburg gait    Cervical / Trunk Assessment Cervical / Trunk Assessment: Other exceptions Cervical / Trunk Exceptions: forward head, rounded shoulders   Communication   Communication: No difficulties  Cognition Arousal/Alertness: Awake/alert Behavior During Therapy: WFL for tasks assessed/performed Overall Cognitive Status: Within Functional Limits for tasks assessed                                        General Comments      Exercises     Assessment/Plan    PT Assessment Patient needs continued PT services  PT Problem List Decreased strength;Decreased mobility;Decreased safety awareness;Decreased balance;Decreased knowledge of use of DME;Decreased coordination       PT Treatment Interventions DME instruction;Therapeutic activities;Gait training;Therapeutic exercise;Patient/family education;Balance training;Functional mobility training;Neuromuscular re-education    PT Goals (Current goals can be found in the Care Plan section)  Acute Rehab PT Goals Patient Stated Goal: get stronger, go home PT Goal Formulation: With patient Time For Goal Achievement: 01/04/20 Potential to Achieve Goals: Good    Frequency Min 3X/week   Barriers to discharge        Co-evaluation PT/OT/SLP Co-Evaluation/Treatment: Yes Reason for Co-Treatment: For patient/therapist safety PT goals addressed during session: Mobility/safety with mobility;Balance         AM-PAC PT "6 Clicks" Mobility  Outcome Measure Help needed turning from your back to your side while in a flat bed without using bedrails?: A Little Help needed moving from lying on your back to sitting on the side of a flat bed without using bedrails?: A Little Help needed moving to and from a bed to a chair (including a wheelchair)?: A Little Help needed standing up from a chair using your arms (e.g., wheelchair or bedside chair)?: A Little Help needed to walk in hospital room?: A Little Help needed climbing 3-5 steps with a railing? : A Lot 6 Click Score: 17    End of Session Equipment Utilized During Treatment: Gait belt Activity Tolerance: Patient tolerated  treatment well;Patient limited by fatigue Patient left: in chair;with call bell/phone within reach;with chair alarm set;with family/visitor present Nurse Communication: Mobility status PT Visit Diagnosis: Other abnormalities of gait and mobility (R26.89);Muscle weakness (generalized) (M62.81);Other symptoms and signs involving the nervous system (R29.898)    Time: 1410-1435 PT Time Calculation (min) (ACUTE ONLY): 25 min   Charges:   PT Evaluation $PT Eval Low Complexity: 1 Low         Ariani Seier E, PT Acute Rehabilitation Services Pager [phone]  Office [phone]   Jadalyn Oliveri D Elonda Husky 12/21/2019, 4:42 PM

## 2019-12-21 NOTE — Progress Notes (Signed)
PROGRESS NOTE  Christina Escobar [id number] DOB: Nov 24, 1929 DOA: 12/20/2019 PCP: Haywood Pao, MD  Brief History    Christina Escobar is a 86 y.o. female with medical history significant of hypertension, IBS, anxiety presented to the ED with worsening weakness in bilateral lower extremities. Symptoms started about 3 days ago, feels only weakness of her legs, no feeling of numbness or tingling, no weakness on arms. She does have some chronic lower back pain, and she felt it was her back problems causing her leg weakness.  Her last 2 days her mobility reduced to walker dependent (belongs to her husband).She was treated for a MRSA skin infection and completed antibiotics about a week ago. She denies any headache, numbness in arms or legs, fever, chest pain, abdominal pain, vomiting, diarrhea.  She said she has been on same blood pressure medication for more than 20 years, and she has been checking her blood pressure every day for last month which was recommended by visiting nurse.  And her blood pressure has been fairly controlled. ED Course: MRI showed left basal ganglia acute infarct  [facility] were consulted to admit the patient for further work up and treatment.  She was admitted to a telemetry bed. CTA head and neck was negative for any large vessel disease contributing to her risk of CVA. Echocardiogram was obtained that demonstrated an EF of 70-75% with hyperdynamic function and no regional wall motion abnormalities. She was found to have Grade 1 diastolic dysfunction. She has normal RV systolic function and normal PA systolic pressure. There was no intracardiac source of emboli found. The patient has been evaluated by PT. They have recommended that the patient be discharged to the inpatient rehab program at her assisted living facility with 24/7 supervision. She has a great deal of weakness in her lower extremities and has a great deal of difficulty walking even with a  walker. I have discussed the patient with Dr. Erlinda Hong. She will be discharged on DAPT. She will continue this for 3 weeks and then continue on ASA only. She will follow up with neurology in 4 weeks. She is medically cleared for discharge. She is awaiting bed placement at inpatient rehab at [facility] where she resides in assisted living.   Consultants  . Neurology  Procedures  . None  Antibiotics   Anti-infectives (From admission, onward)   None    .  Subjective  The patient is resting comfortably. She states that she feels much better, but that she is still very weak in her legs.  Objective   Vitals:  Vitals:   12/21/19 0808 12/21/19 1412  BP: 119/60 126/72  Pulse: 81 87  Resp: 18 20  Temp: 99 F (37.2 C) 98.5 F (36.9 C)  SpO2: 97% 98%   Exam:  Constitutional:  . The patient is awake, alert, and oriented x 3. No acute distress. Respiratory:  . No increased work of breathing. . No wheezes, rales, or rhonchi . No tactile fremitus Cardiovascular:  . Regular rate and rhythm . No murmurs, ectopy, or gallups. . No lateral PMI. No thrills. Abdomen:  . Abdomen is soft, non-tender, non-distended . No hernias, masses, or organomegaly . Normoactive bowel sounds.  Musculoskeletal:  . No cyanosis, clubbing, or edema Skin:  . No rashes, lesions, ulcers . palpation of skin: no induration or nodules Neurologic:  . CN 2-12 intact . Sensation all 4 extremities intact Psychiatric:  . Mental status o Mood, affect appropriate o Orientation to person,  place, time  . judgment and insight appear intact  I have personally reviewed the following:   Today's Data  . Lipid panel  Imaging  . CTA head and neck . MRI brain . CT brain  Cardiology Data  . Echocardiogram  Scheduled Meds: .  stroke: mapping our early stages of recovery book   Does not apply Once  . aspirin EC  81 mg Oral Daily  . atorvastatin  40 mg Oral q1800  . calcium carbonate  1 tablet Oral Daily  .  calcium-vitamin D  1 tablet Oral Daily  . clopidogrel  75 mg Oral Daily  . heparin  5,000 Units Subcutaneous Q12H  . loratadine  10 mg Oral Daily  . LORazepam  1 mg Oral BID AC & HS  . mirtazapine  45 mg Oral QHS  . multivitamin with minerals  1 tablet Oral Daily  . mupirocin ointment  1 application Topical Daily   Continuous Infusions:  Active Problems:   CVA (cerebral vascular accident) ([place])   LOS: 1 day   A & P   Acute left basal ganglia CVA:The patient has been evaluated by Dr. Malen Gauze. She has undergone CTA head and neck which was negative for significant large vessel disease. Echocardiogram has demonstrated an EF of 70-75% with hyperdynamic function and no regional wall motion abnormalities. She was found to have Grade 1 diastolic dysfunction. She has normal RV systolic function and normal PA systolic pressure. There was no intracardiac source of emboli found. The patient has been evaluated by PT. They have recommended that the patient be discharged to the inpatient rehab program at her assisted living facility with 24/7 supervision. She has a great deal of weakness in her lower extremities and has a great deal of difficulty walking even with a walker. I have discussed the patient with Dr. Erlinda Hong. She will be discharged on DAPT. She will continue this for 3 weeks and then continue on ASA only. She will follow up with neurology in 4 weeks. She is medically cleared for discharge. She is awaiting bed placement at inpatient rehab at [facility] where she resides in assisted living.   HTN: Practicing leniency in terms of treating hypertension. Hold home BP meds, as needed BP meds only for systolic blood pressure greater than 220.  Anxiety: Continue benzo and SSRI.  Gait abnormality: The patient remains very weak in her lower extremities bilaterally.  I have seen and examined this patient myself. I have spent 36 minutes in her evaluation and care.   DVT prophylaxis: Heparin Code Status:  Full code Family Communication: Husband at bedside Disposition Plan: Patient is from Home (ALF). Plan is for discharge to inpatient rehab at her assisted living facility. She is medically cleared for discharge. Insurance approval is pending.   Christina Yano, DO [facility] Direct contact: see [email or web address]  7PM-7AM contact night coverage as above 12/21/2019, 4:09 PM  LOS: 1 day

## 2019-12-21 NOTE — Progress Notes (Addendum)
STROKE TEAM PROGRESS NOTE   INTERVAL HISTORY RN and husband at bedside. Pt sitting in bed, not in distress. No acute event overnight. She still has right sided ataxia, PT/OT pending. CTA head and neck unremarkable. On DAPT.   Vitals:   12/20/19 2230 12/20/19 2330 12/21/19 0546 12/21/19 0808  BP: (!) 132/57 128/70 126/81 119/60  Pulse: 72 76 79 81  Resp: 18 20 16 18   Temp: 98 F (36.7 C) 98 F (36.7 C) 98.1 F (36.7 C) 99 F (37.2 C)  TempSrc: Oral Oral Oral Oral  SpO2: 99% 98% 98% 97%  Weight:      Height:        CBC:  Recent Labs  Lab 12/20/19 1140  WBC 5.7  NEUTROABS 3.1  HGB 12.6  HCT 38.6  MCV 93.9  PLT [id number]    Basic Metabolic Panel:  Recent Labs  Lab 12/20/19 1140  NA 138  K 4.4  CL 102  CO2 26  GLUCOSE 106*  BUN 12  CREATININE 0.96  CALCIUM 9.9   Lipid Panel:     Component Value Date/Time   CHOL 209 (H) 12/21/2019 0348   TRIG 72 12/21/2019 0348   HDL 93 12/21/2019 0348   CHOLHDL 2.2 12/21/2019 0348   VLDL 14 12/21/2019 0348   LDLCALC 102 (H) 12/21/2019 0348   HgbA1c: No results found for: HGBA1C Urine Drug Screen: No results found for: LABOPIA, COCAINSCRNUR, LABBENZ, AMPHETMU, THCU, LABBARB  Alcohol Level No results found for: ETH  IMAGING past 24 hours DG Chest 2 View  Result Date: 12/20/2019 CLINICAL DATA:  Weakness EXAM: CHEST - 2 VIEW COMPARISON:  None. FINDINGS: The heart size and mediastinal contours are within normal limits. Atherosclerotic calcification of the aortic knob. No focal airspace consolidation, pleural effusion, or pneumothorax. Degenerative changes of the shoulders, left worse than right. IMPRESSION: No active cardiopulmonary disease. Electronically Signed   By: Davina Poke D.O.   On: 12/20/2019 12:23   MR BRAIN WO CONTRAST  Result Date: 12/20/2019 CLINICAL DATA:  Fatigue, ataxia EXAM: MRI HEAD WITHOUT CONTRAST TECHNIQUE: Multiplanar, multiecho pulse sequences of the brain and surrounding structures were obtained without  intravenous contrast. COMPARISON:  None FINDINGS: Brain: There is a small acute infarction involving the left caudate body, posterior lentiform nucleus, and intervening white matter. Patchy and confluent areas of T2 hyperintensity in the supratentorial white matter are nonspecific but may reflect moderate chronic microvascular ischemic changes. There is no intracranial mass, mass effect, or edema. There is no hydrocephalus or extra-axial fluid collection. Prominence of the ventricles and sulci reflects mild generalized parenchymal volume loss. Vascular: Major vessel flow voids at the skull base are preserved. Skull and upper cervical spine: Normal marrow signal is preserved. Sinuses/Orbits: Trace mucosal thickening. Bilateral lens replacements. Other: Sella is unremarkable.  Mastoid air cells are clear. IMPRESSION: Acute infarction involving the left basal ganglia and adjacent white matter. Moderate chronic microvascular ischemic changes. Electronically Signed   By: Macy Mis M.D.   On: 12/20/2019 15:37    PHYSICAL EXAM  Temp:  [98 F (36.7 C)-99 F (37.2 C)] 99 F (37.2 C) (04/08 0808) Pulse Rate:  [72-82] 81 (04/08 0808) Resp:  [15-22] 18 (04/08 0808) BP: (110-135)/(52-86) 119/60 (04/08 0808) SpO2:  [93 %-99 %] 97 % (04/08 0808)  General - Well nourished, well developed, in no apparent distress.  Ophthalmologic - fundi not visualized due to noncooperation.  Cardiovascular - Regular rhythm and rate.  Mental Status -  Level of arousal and orientation  to time, place, and person were intact. Language including expression, naming, repetition, comprehension was assessed and found intact.  Cranial Nerves II - XII - II - Visual field intact OU. III, IV, VI - Extraocular movements intact. V - Facial sensation intact bilaterally. VII - Facial movement intact bilaterally. VIII - Hearing & vestibular intact bilaterally. X - Palate elevates symmetrically. XI - Chin turning & shoulder shrug  intact bilaterally. XII - Tongue protrusion intact.  Motor Strength - The patient's strength was symmetrical in all extremities and pronator drift was absent.  Bulk was normal and fasciculations were absent.   Motor Tone - Muscle tone was assessed at the neck and appendages and was normal.  Reflexes - The patient's reflexes were symmetrical in all extremities and she had no pathological reflexes.  Sensory - Light touch, temperature/pinprick were assessed and were symmetrical.    Coordination - The patient had normal movements in the left FTN and HTS. However, dysmetria on right FTN and ataxic on right HTS. Mild action tremor was absent bilateral hands.  Gait and Station - deferred.   ASSESSMENT/PLAN Christina Escobar is a 86 y.o. female with history of migraine headaches, hypertension, depression, anxiety, C. difficile presenting with gait difficulties.   Stroke:  Small L AchA infarct most likely secondary to small vessel disease  MRI  L posterior BG and CR infarct.   CTA head & neck mild atherosclerosis  2D Echo EF 70-75%  LDL 102  HgbA1c pending   Heparin 5000 units sq tid for VTE prophylaxis  No antithrombotic prior to admission, now on aspirin 81 mg daily and clopidogrel 75 mg daily. Recommend DAPT for 3 weeks and then ASA alone.  Therapy recommendations:  Pending   Disposition:  Likely home  Hypertension  Stable . Permissive hypertension (OK if < 220/120) but gradually normalize in 2-3 days . Long-term BP goal normotensive  Hyperlipidemia  Home meds:  No statin  Now on lipitor 40  LDL 102, goal < 70  Continue statin at discharge  Other Stroke Risk Factors  Advanced age  ETOH use, advised to drink no more than 1 drink(s) a day  Migraines - followed with Dr. Rexene Escobar at [facility]  Other Active Problems  Anxiety on benzo and SSRI  ? Essential tremor  Hospital day # 1  Neurology will sign off. Please call with questions. Pt will follow up with Dr.  Rexene Escobar at [facility] in about 4 weeks. Thanks for the consult.  Rosalin Hawking, MD PhD Stroke Neurology 12/21/2019 2:04 PM    To contact Stroke Continuity provider, please refer to [email or web address] After hours, contact General Neurology

## 2019-12-21 NOTE — NC FL2 (Signed)
[place] LEVEL OF CARE SCREENING TOOL     IDENTIFICATION  Patient Name: Christina Escobar Birthdate: 07-13-1930 Sex: female Admission Date (Current Location): 12/20/2019  [facility] and [place] Number:  Herbalist and Address:  [facility] [place] [address] [place], [place] [address]      Provider Number: [id number]  Attending Physician Name and Address:  Karie Kirks, DO  Relative Name and Phone Number:       Current Level of Care: Hospital Recommended Level of Care: [place] Prior Approval Number:    Date Approved/Denied:   PASRR Number: [id number]  Discharge Plan: SNF    Current Diagnoses: Patient Active Problem List   Diagnosis Date Noted  . CVA (cerebral vascular accident) ([place]) 12/20/2019  . GAD (generalized anxiety disorder) 08/24/2018  . Generalized abdominal pain 06/02/2018  . Low back pain 06/02/2018  . Diarrhea of presumed infectious origin   . Diarrhea 09/05/2016  . Depression 09/04/2016  . Hypokalemia 09/04/2016  . Recurrent colitis due to Clostridium difficile 05/11/2016  . Essential hypertension 05/11/2016  . Anxiety state 05/11/2016  . Recurrent Clostridium difficile diarrhea   . Clostridium difficile infection 03/27/2016  . Antibiotic enterocolitis 03/25/2016  . Migraine 02/28/2013  . Hemorrhoids, external, thrombosed 11/05/2011    Orientation RESPIRATION BLADDER Height & Weight     Self, Time, Situation, Place  Normal Continent Weight: 110 lb (49.9 kg) Height:  5\' 1"  (154.9 cm)  BEHAVIORAL SYMPTOMS/MOOD NEUROLOGICAL BOWEL NUTRITION STATUS      Continent Diet(see DC summary)  AMBULATORY STATUS COMMUNICATION OF NEEDS Skin   Limited Assist Verbally Surgical wounds(closed upper right shoulder, no dressing)                       Personal Care Assistance Level of Assistance  Bathing, Dressing Bathing Assistance: Limited assistance   Dressing Assistance: Limited assistance      Functional Limitations Info             SPECIAL CARE FACTORS FREQUENCY  PT (By licensed PT), OT (By licensed OT)     PT Frequency: 5x/wk OT Frequency: 5x/wk            Contractures Contractures Info: Not present    Additional Factors Info  Code Status, Allergies Code Status Info: Full Allergies Info: Adhesive (Tape), Cefdinir, Amitiza (Lubiprostone), Erythromycin, Flagyl (Metronidazole), Meloxicam           Current Medications (12/21/2019):  This is the current hospital active medication list Current Facility-Administered Medications  Medication Dose Route Frequency Provider Last Rate Last Admin  .  stroke: mapping our early stages of recovery book   Does not apply Once Wynetta Fines T, MD      . acetaminophen (TYLENOL) tablet 650 mg  650 mg Oral Q4H PRN Lequita Halt, MD   650 mg at 12/21/19 0024  . aspirin EC tablet 81 mg  81 mg Oral Daily Wynetta Fines T, MD   81 mg at 12/21/19 1233  . atorvastatin (LIPITOR) tablet 40 mg  40 mg Oral q1800 Wynetta Fines T, MD   40 mg at 12/20/19 1837  . calcium carbonate (TUMS - dosed in mg elemental calcium) chewable tablet 200 mg of elemental calcium  1 tablet Oral Daily Wynetta Fines T, MD      . calcium-vitamin D (OSCAL WITH D) 500-200 MG-UNIT per tablet 1 tablet  1 tablet Oral Daily Lequita Halt, MD   1 tablet  at 12/21/19 1234  . clopidogrel (PLAVIX) tablet 75 mg  75 mg Oral Daily Rosalin Hawking, MD   75 mg at 12/21/19 1233  . heparin injection 5,000 Units  5,000 Units Subcutaneous Q12H Lequita Halt, MD   5,000 Units at 12/21/19 1230  . hydrALAZINE (APRESOLINE) tablet 25 mg  25 mg Oral Q6H PRN Wynetta Fines T, MD      . loratadine (CLARITIN) tablet 10 mg  10 mg Oral Daily Wynetta Fines T, MD      . LORazepam (ATIVAN) tablet 1 mg  1 mg Oral BID AC & HS Lequita Halt, MD   1 mg at 12/21/19 1233  . mirtazapine (REMERON) tablet 45 mg  45 mg Oral QHS Wynetta Fines T, MD   45 mg at 12/21/19 0023  . multivitamin with minerals tablet 1 tablet  1 tablet  Oral Daily Lequita Halt, MD   1 tablet at 12/21/19 1233  . mupirocin ointment (BACTROBAN) 2 % 1 application  1 application Topical Daily Lequita Halt, MD   1 application at [id number] 1521  . polyethylene glycol (MIRALAX / GLYCOLAX) packet 17 g  1 packet Oral Daily PRN Lequita Halt, MD   17 g at 12/21/19 0024  . senna-docusate (Senokot-S) tablet 1 tablet  1 tablet Oral QHS PRN Wynetta Fines T, MD      . simethicone [facility]) chewable tablet 80 mg  80 mg Oral QID PRN Wynetta Fines T, MD      . SUMAtriptan (IMITREX) tablet 25 mg  25 mg Oral Q2H PRN Wynetta Fines T, MD      . zolpidem (AMBIEN) tablet 5 mg  5 mg Oral QHS PRN Lequita Halt, MD         Discharge Medications: Please see discharge summary for a list of discharge medications.  Relevant Imaging Results:  Relevant Lab Results:   Additional Information SS#: [id number]  Geralynn Ochs, LCSW

## 2019-12-21 NOTE — TOC Initial Note (Addendum)
Transition of Care [facility]) - Initial/Assessment Note    Patient Details  Name: Christina Escobar MRN: [id number] Date of Birth: October 07, 1929  [facility] CM/SW Contact:    Emeterio Reeve, [place] Phone Number: 12/21/2019, 12:27 PM  Clinical Narrative:                 CSW met with pt bedside. CSW introduced herself and explained her role. Pt stated PTA she was living at home with spouse. Pt stated she was independent and was able to take care of all ADL's. Pt stated she does not currently use any equipment.   CSW is waiting for PT/OT recommendations  CSW complete sbirt with pt. Pt denied alcohol use and substance use.   CSW will continue to follow    Barriers to Discharge: Continued Medical Work up   Patient Goals and CMS Choice Patient states their goals for this hospitalization and ongoing recovery are:: To return home CMS Medicare.gov Compare Post Acute Care list provided to:: Patient Choice offered to / list presented to : Patient, Spouse  Expected Discharge Plan and Services         Living arrangements for the past 2 months: Single Family Home                                      Prior Living Arrangements/Services Living arrangements for the past 2 months: Single Family Home Lives with:: Spouse Patient language and need for interpreter reviewed:: Yes Do you feel safe going back to the place where you live?: Yes      Need for Family Participation in Patient Care: Yes (Comment) Care giver support system in place?: Yes (comment)   Criminal Activity/Legal Involvement Pertinent to Current Situation/Hospitalization: No - Comment as needed  Activities of Daily Living Home Assistive Devices/Equipment: Cane (specify quad or straight) ADL Screening (condition at time of admission) Patient's cognitive ability adequate to safely complete daily activities?: Yes Is the patient deaf or have difficulty hearing?: No Does the patient have difficulty seeing, even  when wearing glasses/contacts?: No Does the patient have difficulty concentrating, remembering, or making decisions?: No Patient able to express need for assistance with ADLs?: Yes Does the patient have difficulty dressing or bathing?: No Independently performs ADLs?: Yes (appropriate for developmental age) Does the patient have difficulty walking or climbing stairs?: Yes Weakness of Legs: Both Weakness of Arms/Hands: None  Permission Sought/Granted Permission sought to share information with : Facility Art therapist granted to share information with : No              Emotional Assessment Appearance:: Appears stated age Attitude/Demeanor/Rapport: Engaged Affect (typically observed): Appropriate Orientation: : Oriented to Self, Oriented to Place, Oriented to  Time, Oriented to Situation Alcohol / Substance Use: Never Used Psych Involvement: No (comment)  Admission diagnosis:  CVA (cerebral vascular accident) ([place]) [I63.9] Cerebrovascular accident (CVA), unspecified mechanism ([place]) [I63.9] Patient Active Problem List   Diagnosis Date Noted  . CVA (cerebral vascular accident) ([place]) 12/20/2019  . GAD (generalized anxiety disorder) 08/24/2018  . Generalized abdominal pain 06/02/2018  . Low back pain 06/02/2018  . Diarrhea of presumed infectious origin   . Diarrhea 09/05/2016  . Depression 09/04/2016  . Hypokalemia 09/04/2016  . Recurrent colitis due to Clostridium difficile 05/11/2016  . Essential hypertension 05/11/2016  . Anxiety state 05/11/2016  . Recurrent Clostridium difficile diarrhea   . Clostridium difficile  infection 03/27/2016  . Antibiotic enterocolitis 03/25/2016  . Migraine 02/28/2013  . Hemorrhoids, external, thrombosed 11/05/2011   PCP:  Haywood Pao, MD Pharmacy:   [place], [place] - [address] [place] [address] Phone: [phone] Fax: [phone]  [id number] -  [place], [place] - [address] AT [place] [address] [place] [address] Phone: [phone] Fax: [phone]  [facility] - [place], [place] - [address] [place] [address] Phone: [phone] Fax: [phone]     Social Determinants of Health (SDOH) Interventions    Readmission Risk Interventions No flowsheet data found.

## 2019-12-21 NOTE — Progress Notes (Signed)
Occupational Therapy Evaluation Patient Details Name: Christina Escobar MRN: [id number] DOB: October 22, 1929 Today's Date: 12/21/2019    History of Present Illness 86 y.o. female with history of migraine headaches, hypertension, depression, anxiety, C. difficile presenting with gait difficulties. MRI  L posterior BG and CR infarct.    Clinical Impression   PTA, pt lived independently with her husband at [facility] facility. Pt presents with deficits listed below, increasing her risk of falls and decreasing her independence with ADL and functional mobility. Feel pt would benefit from skilled rehab services at SNF level ([place] [facility] maximize her functional level of independence with the goal of returning home at modified independent level. Pt very motivated to be independent. Will follow acutely to facilitate DC to snf for rehab.     Follow Up Recommendations  SNF(at [facility] where she resides)    Equipment Recommendations  3 in 1 bedside commode    Recommendations for Other Services       Precautions / Restrictions Precautions Precautions: Fall      Mobility Bed Mobility Overal bed mobility: Needs Assistance             General bed mobility comments: pt sitting EOB upon arrival to room.  Transfers Overall transfer level: Needs assistance Equipment used: Rolling walker (2 wheeled) Transfers: Sit to/from Stand Sit to Stand: Min assist;From elevated surface         General transfer comment: min assist for steadying upon standing, verbal cuing for hand placement when rising not followed.    Balance Overall balance assessment: Needs assistance Sitting-balance support: No upper extremity supported;Feet supported Sitting balance-Leahy Scale: Good     Standing balance support: Single extremity supported Standing balance-Leahy Scale: Poor Standing balance comment: reliant on external support                            ADL either performed or assessed with clinical judgement   ADL Overall ADL's : Needs assistance/impaired Eating/Feeding: Set up;Sitting   Grooming: Standing;Minimal assistance(for balance)   Upper Body Bathing: Set up;Sitting   Lower Body Bathing: Minimal assistance;Sit to/from stand   Upper Body Dressing : Minimal assistance;Sitting   Lower Body Dressing: Moderate assistance;Sit to/from stand Lower Body Dressing Details (indicate cue type and reason): difficulty with donning R sock/shoe duer to incoordination adn weakness Toilet Transfer: Minimal assistance;Ambulation;RW   Toileting- Clothing Manipulation and Hygiene: Minimal assistance;Sit to/from stand       Functional mobility during ADLs: Minimal assistance;Rolling walker;Cueing for safety       Vision Baseline Vision/History: Wears glasses Wears Glasses: Reading only Patient Visual Report: No change from baseline Vision Assessment?: No apparent visual deficits Additional Comments: will further assess     Perception Perception Comments: appears [facility]   Praxis Praxis Praxis tested?: Within functional limits    Pertinent Vitals/Pain Pain Assessment: No/denies pain     Hand Dominance Right   Extremity/Trunk Assessment Upper Extremity Assessment Upper Extremity Assessment: RUE deficits/detail RUE Deficits / Details: generally weaker than L but using functionally. states she has more difficulty with her writing, incoordination noted with finger to nose; appaent sensory motor deficits RUE Coordination: decreased fine motor   Lower Extremity Assessment Lower Extremity Assessment: Defer to PT evaluation RLE Deficits / Details: mild incoordination heel--to-shin test, R trendelenburg gait   Cervical / Trunk Assessment Cervical / Trunk Assessment: Kyphotic;Other exceptions Cervical / Trunk Exceptions: forward head, rounded shoulders   Communication  Communication Communication: No difficulties   Cognition  Arousal/Alertness: Awake/alert Behavior During Therapy: WFL for tasks assessed/performed Overall Cognitive Status: Within Functional Limits for tasks assessed                                 General Comments: appears at her baseline   General Comments       Exercises     Shoulder Instructions      Home Living Family/patient expects to be discharged to:: Skilled nursing facility(at her [facility]) Living Arrangements: Spouse/significant other Available Help at Discharge: Family;Available 24 hours/day Type of Home: Independent living facility([facility]) Home Access: Level entry     Home Layout: One level     Bathroom Shower/Tub: Walk-in shower;Tub/shower unit   Bathroom Toilet: Standard Bathroom Accessibility: Yes How Accessible: Accessible via walker;Accessible via wheelchair Home Equipment: Gilford Rile - 2 wheels;Kasandra Knudsen - single point      Lives With: Spouse(who uses a RW)    Prior Functioning/Environment Level of Independence: Independent        Comments: Pt was active and participated in group exercise adn activities prior to Covid; enjoys reading        OT Problem List: Decreased strength;Decreased range of motion;Decreased activity tolerance;Impaired balance (sitting and/or standing);Decreased coordination;Decreased safety awareness;Decreased knowledge of use of DME or AE;Impaired UE functional use      OT Treatment/Interventions: Self-care/ADL training;Therapeutic exercise;Neuromuscular education;DME and/or AE instruction;Therapeutic activities;Patient/family education;Balance training    OT Goals(Current goals can be found in the care plan section) Acute Rehab OT Goals Patient Stated Goal: get stronger, go home OT Goal Formulation: With patient Time For Goal Achievement: 01/04/20 Potential to Achieve Goals: Good  OT Frequency: Min 2X/week   Barriers to D/C:            Co-evaluation PT/OT/SLP  Co-Evaluation/Treatment: Yes Reason for Co-Treatment: For patient/therapist safety PT goals addressed during session: Mobility/safety with mobility;Balance OT goals addressed during session: ADL's and self-care      AM-PAC OT "6 Clicks" Daily Activity     Outcome Measure Help from another person eating meals?: A Little Help from another person taking care of personal grooming?: A Little Help from another person toileting, which includes using toliet, bedpan, or urinal?: A Little Help from another person bathing (including washing, rinsing, drying)?: A Little Help from another person to put on and taking off regular upper body clothing?: A Little Help from another person to put on and taking off regular lower body clothing?: A Little 6 Click Score: 18   End of Session Equipment Utilized During Treatment: Gait belt;Rolling walker Nurse Communication: Mobility status  Activity Tolerance: Patient tolerated treatment well Patient left: in chair;with call bell/phone within reach;with chair alarm set  OT Visit Diagnosis: Unsteadiness on feet (R26.81);Other abnormalities of gait and mobility (R26.89);Muscle weakness (generalized) (M62.81);Ataxia, unspecified (R27.0)                Time: [id number] OT Time Calculation (min): 30 min Charges:  OT General Charges $OT Visit: 1 Visit OT Evaluation $OT Eval Moderate Complexity: [place], OT/L   Acute OT Clinical Specialist Acute Rehabilitation Services Pager [phone] Office [phone]   [facility] 12/21/2019, 5:59 PM

## 2019-12-21 NOTE — Evaluation (Signed)
Speech Language Pathology Evaluation Patient Details Name: Christina Escobar MRN: [id number] DOB: [birth year] Today's Date: 12/21/2019 Time: [id number] SLP Time Calculation (min) (ACUTE ONLY): 29 min  Problem List:  Patient Active Problem List   Diagnosis Date Noted  . CVA (cerebral vascular accident) ([place]) 12/20/2019  . GAD (generalized anxiety disorder) 08/24/2018  . Generalized abdominal pain 06/02/2018  . Low back pain 06/02/2018  . Diarrhea of presumed infectious origin   . Diarrhea 09/05/2016  . Depression 09/04/2016  . Hypokalemia 09/04/2016  . Recurrent colitis due to Clostridium difficile 05/11/2016  . Essential hypertension 05/11/2016  . Anxiety state 05/11/2016  . Recurrent Clostridium difficile diarrhea   . Clostridium difficile infection 03/27/2016  . Antibiotic enterocolitis 03/25/2016  . Migraine 02/28/2013  . Hemorrhoids, external, thrombosed 11/05/2011   Past Medical History:  Past Medical History:  Diagnosis Date  . Allergy   . Anal fissure   . Anxiety   . Basal cell carcinoma   . Cataracts, bilateral   . Clostridium difficile diarrhea   . Depression   . Diverticulosis    hx of stricture in colon  . Hemorrhoids   . Hypertension   . IBS (irritable bowel syndrome)   . Insomnia   . Migraine   . Osteopenia   . Thrombosed hemorrhoids    Past Surgical History:  Past Surgical History:  Procedure Laterality Date  . APPENDECTOMY    . BASAL CELL CARCINOMA EXCISION  [id number]  . CATARACT EXTRACTION, BILATERAL Bilateral 1999  . COLON RESECTION  2001  . COLONOSCOPY WITH PROPOFOL N/A 10/22/2016   Procedure: COLONOSCOPY WITH PROPOFOL;  Surgeon: Milus Banister, MD;  Location: [facility];  Service: Endoscopy;  Laterality: N/A;  . FECAL TRANSPLANT N/A 10/22/2016   Procedure: FECAL TRANSPLANT;  Surgeon: Milus Banister, MD;  Location: [facility];  Service: Endoscopy;  Laterality: N/A;  . INCISION AND DRAINAGE  11/05/2011   thrombosed hemorrhoid  . TRANSANAL  EXCISION OF RECTAL MASS WITH HEMORRHOID INJECTION  2007   anal fissure   HPI:  Christina Escobar is a 86 y.o. female with medical history significant of hypertension, IBS, anxiety presented to the ED with worsening weakness in bilateral lower extremities.  MRI reported: "Acute infarction involving the left basal ganglia and adjacent white matter."   Assessment / Plan / Recommendation Clinical Impression  Pt was seen for a cognitive-linguistic evaluation in the setting of an acute infarct.  Pt was encountered awake/alert with husband present at bedside and she was pleasant and cooperative throughout this evaluation.  Pt reported that she lives with her husband and dog in a town home at an independent living facility.  She stated that she was fully independent with IADLs prior to admission.  Pt completed the [place] Examination in addition to informal evaluation measures.  She presents with overall functional cognitive-linguistic abilities.  She scored overall 27/30 on the SLUMS which is WNL (>/=27/30).  Her expressive and receptive language abilities were functional and no dysarthria was observed.  Recommend brief supervision with IADLs (medications, finances, etc.) when pt is first discharged.  SLP educated pt and husband regarding all results and recommendations.  No further skilled ST is warranted at this time.  Please reconsult if additional needs arise.      SLP Assessment  SLP Recommendation/Assessment: Patient does not need any further Speech [place] Pathology Services SLP Visit Diagnosis: Cognitive communication deficit (R41.841)    Follow Up Recommendations  None    Frequency and Duration  SLP Evaluation Cognition  Overall Cognitive Status: Within Functional Limits for tasks assessed Arousal/Alertness: Awake/alert Orientation Level: Oriented X4 Attention: Sustained;Divided Sustained Attention: Appears intact Divided Attention: Appears intact Memory: Appears  intact Awareness: Appears intact Problem Solving: Appears intact Executive Function: Organizing;Sequencing;Reasoning Reasoning: Appears intact Sequencing: Appears intact Organizing: Appears intact Safety/Judgment: Appears intact       Comprehension  Auditory Comprehension Overall Auditory Comprehension: Appears within functional limits for tasks assessed    Expression Expression Primary Mode of Expression: Verbal Verbal Expression Overall Verbal Expression: Appears within functional limits for tasks assessed Written Expression Dominant Hand: Right   Oral / Motor  Oral Motor/Sensory Function Overall Oral Motor/Sensory Function: Within functional limits Motor Speech Overall Motor Speech: Appears within functional limits for tasks assessed   GO                   Christina Escobar M.S., [place] Office: [phone]  Christina Escobar Christina Escobar 12/21/2019, 2:10 PM

## 2019-12-22 DIAGNOSIS — E7800 Pure hypercholesterolemia, unspecified: Secondary | ICD-10-CM

## 2019-12-22 LAB — HEMOGLOBIN A1C
Hgb A1c MFr Bld: 5.4 % (ref 4.8–5.6)
Mean Plasma Glucose: 108 mg/dL

## 2019-12-22 NOTE — TOC Progression Note (Signed)
Transition of Care [facility]) - Progression Note    Patient Details  Name: SWAY MANGAR MRN: [id number] Date of Birth: October 08, 1929  Transition of Care [facility]) CM/SW [place], [place] Phone Number: 12/22/2019, 3:06 PM  Clinical Narrative:   CSW alerted earlier today by [facility] that they do not take the patient's insurance, it would be private pay for SNF. CSW provided information to patient, and she still wants to go to [facility] for SNF. Bed not available until tomorrow as they are doing repairs. CSW updated MD about barrier. Patient can discharge to [facility] tomorrow morning.    Expected Discharge Plan: [place] Barriers to Discharge: Continued Medical Work up  Expected Discharge Plan and Services Expected Discharge Plan: [place] Choice: [place] arrangements for the past 2 months: [place]                                       Social Determinants of Health (SDOH) Interventions    Readmission Risk Interventions No flowsheet data found.

## 2019-12-22 NOTE — Progress Notes (Signed)
PROGRESS NOTE  Christina Escobar [id number] DOB: 1929/11/26 DOA: 12/20/2019 PCP: Haywood Pao, MD  Brief History    Christina Escobar is a 86 y.o. female with medical history significant of hypertension, IBS, anxiety presented to the ED with worsening weakness in bilateral lower extremities. Symptoms started about 3 days ago, feels only weakness of her legs, no feeling of numbness or tingling, no weakness on arms. She does have some chronic lower back pain, and she felt it was her back problems causing her leg weakness.  Her last 2 days her mobility reduced to walker dependent (belongs to her husband).She was treated for a MRSA skin infection and completed antibiotics about a week ago. She denies any headache, numbness in arms or legs, fever, chest pain, abdominal pain, vomiting, diarrhea.  She said she has been on same blood pressure medication for more than 20 years, and she has been checking her blood pressure every day for last month which was recommended by visiting nurse.  And her blood pressure has been fairly controlled. ED Course: MRI showed left basal ganglia acute infarct  [facility] were consulted to admit the patient for further work up and treatment.  She was admitted to a telemetry bed. CTA head and neck was negative for any large vessel disease contributing to her risk of CVA. Echocardiogram was obtained that demonstrated an EF of 70-75% with hyperdynamic function and no regional wall motion abnormalities. She was found to have Grade 1 diastolic dysfunction. She has normal RV systolic function and normal PA systolic pressure. There was no intracardiac source of emboli found. The patient has been evaluated by PT. They have recommended that the patient be discharged to the inpatient rehab program at her assisted living facility with 24/7 supervision. She has a great deal of weakness in her lower extremities and has a great deal of difficulty walking even with a  walker. I have discussed the patient with Dr. Erlinda Hong. She will be discharged on DAPT. She will continue this for 3 weeks and then continue on ASA only. She will follow up with neurology in 4 weeks. She is medically cleared for discharge. She is awaiting bed placement at inpatient rehab at [facility] where she resides in assisted living. It is anticipated that they will have a bed for her tomorrow.  Consultants  . Neurology  Procedures  . None  Antibiotics   Anti-infectives (From admission, onward)   None     Subjective  The patient is resting comfortably. No new complaints.  Objective   Vitals:  Vitals:   12/22/19 0810 12/22/19 1226  BP: (!) 151/69 (!) 146/66  Pulse: 93 76  Resp: 16 16  Temp: 98.8 F (37.1 C) 98.4 F (36.9 C)  SpO2: 93% 96%   Exam:  Constitutional:  . The patient is awake, alert, and oriented x 3. No acute distress. Respiratory:  . No increased work of breathing. . No wheezes, rales, or rhonchi . No tactile fremitus Cardiovascular:  . Regular rate and rhythm . No murmurs, ectopy, or gallups. . No lateral PMI. No thrills. Abdomen:  . Abdomen is soft, non-tender, non-distended . No hernias, masses, or organomegaly . Normoactive bowel sounds.  Musculoskeletal:  . No cyanosis, clubbing, or edema Skin:  . No rashes, lesions, ulcers . palpation of skin: no induration or nodules Neurologic:  . CN 2-12 intact . Sensation all 4 extremities intact Psychiatric:  . Mental status o Mood, affect appropriate o Orientation to person, place, time  .  judgment and insight appear intact  I have personally reviewed the following:   Today's Data  . Lipid panel  Imaging  . CTA head and neck . MRI brain . CT brain  Cardiology Data  . Echocardiogram  Scheduled Meds: .  stroke: mapping our early stages of recovery book   Does not apply Once  . aspirin EC  81 mg Oral Daily  . atorvastatin  40 mg Oral q1800  . calcium carbonate  1 tablet Oral Daily  .  calcium-vitamin D  1 tablet Oral Daily  . clopidogrel  75 mg Oral Daily  . heparin  5,000 Units Subcutaneous Q12H  . loratadine  10 mg Oral Daily  . LORazepam  1 mg Oral BID AC & HS  . mirtazapine  45 mg Oral QHS  . multivitamin with minerals  1 tablet Oral Daily  . mupirocin ointment  1 application Topical Daily   Continuous Infusions:  Active Problems:   CVA (cerebral vascular accident) ([place])   LOS: 2 days   A & P   Acute left basal ganglia CVA:The patient has been evaluated by Dr. Malen Gauze. She has undergone CTA head and neck which was negative for significant large vessel disease. Echocardiogram has demonstrated an EF of 70-75% with hyperdynamic function and no regional wall motion abnormalities. She was found to have Grade 1 diastolic dysfunction. She has normal RV systolic function and normal PA systolic pressure. There was no intracardiac source of emboli found. The patient has been evaluated by PT. They have recommended that the patient be discharged to the inpatient rehab program at her assisted living facility with 24/7 supervision. She has a great deal of weakness in her lower extremities and has a great deal of difficulty walking even with a walker. I have discussed the patient with Dr. Erlinda Hong. She will be discharged on DAPT. She will continue this for 3 weeks and then continue on ASA only. She will follow up with neurology in 4 weeks. She is medically cleared for discharge. She is awaiting bed placement at inpatient rehab at [facility] where she resides in assisted living. It is anticipated that they will have a bed for her tomorrow.  HTN: Practicing leniency in terms of treating hypertension. Hold home BP meds, as needed BP meds only for systolic blood pressure greater than 220.  Anxiety: Continue benzo and SSRI.  Gait abnormality: The patient remains very weak in her lower extremities bilaterally.  I have seen and examined this patient myself. I have spent 30 minutes in her  evaluation and care.   DVT prophylaxis: Heparin Code Status: Full code Family Communication: Husband at bedside Disposition Plan: Patient is from Home (ALF). Plan is for discharge to inpatient rehab at her assisted living facility. She is medically cleared for discharge. She is awaiting bed availability. It will likely be available tomorrow.  Rodolphe Edmonston, DO [facility] Direct contact: see [email or web address]  7PM-7AM contact night coverage as above 12/21/2019, 4:09 PM  LOS: 1 day

## 2019-12-22 NOTE — Plan of Care (Signed)
  Problem: Education: Goal: Knowledge of disease or condition will improve 12/22/2019 1112 by Caroll Rancher, RN Outcome: Progressing 12/22/2019 1111 by Caroll Rancher, RN Outcome: Progressing

## 2019-12-22 NOTE — Plan of Care (Signed)
  Problem: Education: Goal: Knowledge of disease or condition will improve Outcome: Progressing Goal: Knowledge of secondary prevention will improve Outcome: Progressing Goal: Knowledge of patient specific risk factors addressed and post discharge goals established will improve Outcome: Progressing Goal: Individualized Educational Video(s) Outcome: Progressing   Problem: Coping: Goal: Will verbalize positive feelings about self Outcome: Progressing Goal: Will identify appropriate support needs Outcome: Progressing   Problem: Health Behavior/Discharge Planning: Goal: Ability to manage health-related needs will improve Outcome: Progressing   Problem: Self-Care: Goal: Ability to participate in self-care as condition permits will improve Outcome: Progressing Goal: Verbalization of feelings and concerns over difficulty with self-care will improve Outcome: Progressing Goal: Ability to communicate needs accurately will improve Outcome: Progressing   Problem: Nutrition: Goal: Risk of aspiration will decrease Outcome: Progressing Goal: Dietary intake will improve Outcome: Progressing   Problem: Ischemic Stroke/TIA Tissue Perfusion: Goal: Complications of ischemic stroke/TIA will be minimized Outcome: Progressing   Problem: Education: Goal: Knowledge of General Education information will improve Description: Including pain rating scale, medication(s)/side effects and non-pharmacologic comfort measures Outcome: Progressing   Problem: Health Behavior/Discharge Planning: Goal: Ability to manage health-related needs will improve Outcome: Progressing   Problem: Clinical Measurements: Goal: Ability to maintain clinical measurements within normal limits will improve Outcome: Progressing Goal: Will remain free from infection Outcome: Progressing Goal: Diagnostic test results will improve Outcome: Progressing Goal: Respiratory complications will improve Outcome: Progressing Goal:  Cardiovascular complication will be avoided Outcome: Progressing   Problem: Activity: Goal: Risk for activity intolerance will decrease Outcome: Progressing   Problem: Nutrition: Goal: Adequate nutrition will be maintained Outcome: Progressing   Problem: Coping: Goal: Level of anxiety will decrease Outcome: Progressing   Problem: Elimination: Goal: Will not experience complications related to bowel motility Outcome: Progressing Goal: Will not experience complications related to urinary retention Outcome: Progressing   Problem: Pain Managment: Goal: General experience of comfort will improve Outcome: Progressing   Problem: Skin Integrity: Goal: Risk for impaired skin integrity will decrease Outcome: Progressing

## 2019-12-22 NOTE — TOC Progression Note (Signed)
Transition of Care [facility]) - Progression Note    Patient Details  Name: Christina Escobar MRN: [id number] Date of Birth: 02/29/1930  Transition of Care [facility]) CM/SW [place], [place] Phone Number: 12/22/2019, 3:05 PM  Clinical Narrative:    CSW met with patient and spouse at bedside, confirmed plan for SNF placement at [facility]. Awaiting OT/PT eval to start insurance authorization request. CSW to follow.    Expected Discharge Plan: [place] Barriers to Discharge: Continued Medical Work up  Expected Discharge Plan and Services Expected Discharge Plan: [place] Choice: [place] arrangements for the past 2 months: [place]                                       Social Determinants of Health (SDOH) Interventions    Readmission Risk Interventions No flowsheet data found.

## 2019-12-23 DIAGNOSIS — K581 Irritable bowel syndrome with constipation: Secondary | ICD-10-CM

## 2019-12-23 DIAGNOSIS — R29898 Other symptoms and signs involving the musculoskeletal system: Secondary | ICD-10-CM

## 2019-12-23 DIAGNOSIS — R262 Difficulty in walking, not elsewhere classified: Secondary | ICD-10-CM

## 2019-12-23 MED ORDER — STROKE: EARLY STAGES OF RECOVERY BOOK: 1.0000 | Freq: Once | 0 refills | Status: AC

## 2019-12-23 MED ORDER — LORAZEPAM 1 MG PO TABS: 1.0000 mg | ORAL_TABLET | Freq: Two times a day (BID) | ORAL | 0 refills | Status: DC

## 2019-12-23 MED ORDER — AMLODIPINE BESYLATE 5 MG PO TABS: 5.0000 mg | ORAL_TABLET | Freq: Every day | ORAL | 0 refills | Status: AC

## 2019-12-23 MED ORDER — ASPIRIN 81 MG PO TBEC: 81.0000 mg | DELAYED_RELEASE_TABLET | Freq: Every day | ORAL | 0 refills | Status: AC

## 2019-12-23 MED ORDER — ATORVASTATIN CALCIUM 40 MG PO TABS: 40.0000 mg | ORAL_TABLET | Freq: Every day | ORAL | 0 refills | Status: AC

## 2019-12-23 MED ORDER — CLOPIDOGREL BISULFATE 75 MG PO TABS: 75.0000 mg | ORAL_TABLET | Freq: Every day | ORAL | 0 refills | Status: DC

## 2019-12-23 NOTE — Discharge Summary (Signed)
Physician Discharge Summary  Christina Escobar [id number] DOB: 11/19/1929 DOA: 12/20/2019  PCP: Haywood Pao, MD  Admit date: 12/20/2019 Discharge date: 12/23/2019  Recommendations for Outpatient Follow-up:  1. Discharge to SNF with PT/OT 2. Follow up with PCP in 7-10 days after discharge from facility. 3. No driving until cleared by PCP 4. Follow up with neurology as directed. 5. Take Aspirin and Plavix daily for 3 weeks, then stop Plavix and take only aspirin.   Contact information for follow-up providers    Star Age, MD. Schedule an appointment as soon as possible for a visit in 4 week(s).   Specialties: Neurology, Radiology Contact information: [address] [place]  [address] [phone]            Contact information for after-discharge care    Destination    [facility] .   Service: Skilled Nursing Contact information: [place] [phone]                 Discharge Diagnoses: Principal diagnosis is #1 1. Left basal ganglia infarct 2. Hypertension 3. Hyperlipidemia 4. Anxiety 5. IBS 6. B/L lower extremity weakness 7. Ambulatory dysfunction  Discharge Condition: Fair  Disposition: SNF  Diet recommendation: Heart healthy  Filed Weights   12/20/19 1125  Weight: 49.9 kg   History of present illness: Christina Escobar is a 86 y.o. female with medical history significant of hypertension, IBS, anxiety presented to the ED with worsening weakness in bilateral lower extremities. Symptoms started about 3 days ago, feels only weakness of her legs, no feeling of numbness or tingling, no weakness on arms. She does have some chronic lower back pain, and she felt it was her back problems causing her leg weakness.  Her last 2 days her mobility reduced to walker dependent (belongs to her husband).She was treated for a MRSA skin infection and completed antibiotics  about a week ago. She denies any headache, numbness in arms or legs, fever, chest pain, abdominal pain, vomiting, diarrhea.  She said she has been on same blood pressure medication for more than 20 years, and she has been checking her blood pressure every day for last month which was recommended by visiting nurse.  And her blood pressure has been fairly controlled. ED Course: MRI showed left basal ganglia acute infarct  [facility] were consulted to admit the patient for further evaluation and treatment.  Hospital Course:  The patient was admitted to a telemetry bed. Neurology was consulted and the patient has undergone a stroke work up. CTA head and neck was negative for any large vessel disease contributing to her risk of CVA. Echocardiogram was obtained that demonstrated an EF of 70-75% with hyperdynamic function and no regional wall motion abnormalities. She was found to have Grade 1 diastolic dysfunction. She has normal RV systolic function and normal PA systolic pressure. There was no intracardiac source of emboli found. The patient has been evaluated by PT. They have recommended that the patient be discharged to the inpatient rehab program at her assisted living facility with 24/7 supervision. She has a great deal of weakness in her lower extremities and has a great deal of difficulty walking even with a walker. I have discussed the patient with Dr. Erlinda Hong. She will be discharged on DAPT. She will continue this for 3 weeks and then continue on ASA only. She will follow up with neurology in 4 weeks. She is medically cleared for  discharge. She is awaiting bed placement at inpatient rehab at [facility] where she resides in an independent living facility.  Today's assessment: S: The patient is resting comfortably. No new complaints. O: Vitals:  Vitals:   12/23/19 0907 12/23/19 1203  BP: (!) 162/81 (!) 149/106  Pulse: 96 76  Resp: (!) 22 20  Temp: 98.4 F (36.9 C) 98.1 F (36.7 C)   SpO2: 99% 98%   Exam:  Constitutional:   The patient is awake, alert, and oriented x 3. No acute distress. Respiratory:   No increased work of breathing.  No wheezes, rales, or rhonchi  No tactile fremitus Cardiovascular:   Regular rate and rhythm  No murmurs, ectopy, or gallups.  No lateral PMI. No thrills. Abdomen:   Abdomen is soft, non-tender, non-distended  No hernias, masses, or organomegaly  Normoactive bowel sounds.  Musculoskeletal:   No cyanosis, clubbing, or edema Skin:   No rashes, lesions, ulcers  palpation of skin: no induration or nodules Neurologic:   CN 2-12 intact  Sensation all 4 extremities intact Psychiatric:   Mental status ? Mood, affect appropriate ? Orientation to person, place, time   judgment and insight appear intact  I have personally reviewed the following:   Discharge Instructions  Discharge Instructions    Activity as tolerated - No restrictions   Complete by: As directed    Ambulatory referral to Neurology   Complete by: As directed    An appointment is requested in approximately: 4 weeks. Pt was Dr. Guadelupe Sabin pt.   Call MD for:   Complete by: As directed    Neurological deficits   Diet - low sodium heart healthy   Complete by: As directed    Discharge instructions   Complete by: As directed    Discharge to SNF with PT/OT Follow up with PCP in 7-10 days after discharge from facility. No driving until cleared by PCP Follow up with neurology as directed. Take Aspirin and Plavix daily for 3 weeks, then stop Plavix and take only aspirin.   Increase activity slowly   Complete by: As directed      Allergies as of 12/23/2019      Reactions   Adhesive [tape] Other (See Comments)   Caused SEVERE skin irritation   Cefdinir Other (See Comments)   Caused C-Diff   Amitiza [lubiprostone] Diarrhea   Erythromycin Nausea And Vomiting   Flagyl [metronidazole] Diarrhea   Meloxicam Other (See Comments)   Dizziness  resulted after taking it       Medication List    STOP taking these medications   loperamide 2 MG tablet Commonly known as: IMODIUM A-D   zolpidem 5 MG tablet Commonly known as: AMBIEN     TAKE these medications    stroke: mapping our early stages of recovery book Misc 1 each by Does not apply route once for 1 dose.   acetaminophen 325 MG tablet Commonly known as: TYLENOL Take 325-650 mg by mouth every 8 (eight) hours as needed for mild pain or headache.   amLODipine 5 MG tablet Commonly known as: NORVASC Take 1 tablet (5 mg total) by mouth daily.   aspirin 81 MG EC tablet Take 1 tablet (81 mg total) by mouth daily. Start taking on: December 24, 2019   atorvastatin 40 MG tablet Commonly known as: LIPITOR Take 1 tablet (40 mg total) by mouth daily at 6 PM.   Calcium 600-D 600-400 MG-UNIT Tabs Generic drug: Calcium Carbonate-Vitamin D3 Take 1 tablet by mouth  daily.   CITRUCEL PO Take by mouth See admin instructions. Mix 4.5 heaping teaspoonsful of powder into 8 ounces of water and drink once a day at bedtime   clopidogrel 75 MG tablet Commonly known as: PLAVIX Take 1 tablet (75 mg total) by mouth daily. Start taking on: December 24, 2019   fluticasone 50 MCG/ACT nasal spray Commonly known as: FLONASE Place 1 spray into both nostrils at bedtime.   loratadine 10 MG tablet Commonly known as: CLARITIN Take 10 mg by mouth daily as needed for allergies or rhinitis.   LORazepam 1 MG tablet Commonly known as: ATIVAN Take 1 tablet (1 mg total) by mouth 2 (two) times daily. What changed:   when to take this  Another medication with the same name was removed. Continue taking this medication, and follow the directions you see here.   mirtazapine 45 MG tablet Commonly known as: REMERON Take 1 tablet (45 mg total) by mouth at bedtime.   multivitamin with minerals Tabs tablet Take 1 tablet by mouth daily.   mupirocin ointment 2 % Commonly known as: BACTROBAN Apply 1  application topically See admin instructions. Apply to the right arm 2 times a day   Preparation H 0.25-14-74.9 % rectal ointment Generic drug: phenylephrine-shark liver oil-mineral oil-petrolatum Place 1 application rectally 2 (two) times daily as needed for hemorrhoids.   quinapril 40 MG tablet Commonly known as: ACCUPRIL Take 40 mg by mouth in the morning.   simethicone 125 MG chewable tablet Commonly known as: MYLICON Chew 0000000 mg by mouth every 6 (six) hours as needed for flatulence.   SUMAtriptan 25 MG tablet Commonly known as: IMITREX Take 25 mg by mouth once as needed for migraine.      Allergies  Allergen Reactions   Adhesive [Tape] Other (See Comments)    Caused SEVERE skin irritation   Cefdinir Other (See Comments)    Caused C-Diff   Amitiza [Lubiprostone] Diarrhea   Erythromycin Nausea And Vomiting   Flagyl [Metronidazole] Diarrhea   Meloxicam Other (See Comments)    Dizziness resulted after taking it     The results of significant diagnostics from this hospitalization (including imaging, microbiology, ancillary and laboratory) are listed below for reference.    Significant Diagnostic Studies: CT ANGIO HEAD W OR WO CONTRAST  Result Date: 12/21/2019 CLINICAL DATA:  Stroke, follow-up. EXAM: CT ANGIOGRAPHY HEAD AND NECK TECHNIQUE: Multidetector CT imaging of the head and neck was performed using the standard protocol during bolus administration of intravenous contrast. Multiplanar CT image reconstructions and MIPs were obtained to evaluate the vascular anatomy. Carotid stenosis measurements (when applicable) are obtained utilizing NASCET criteria, using the distal internal carotid diameter as the denominator. CONTRAST:  32mL OMNIPAQUE IOHEXOL 350 MG/ML SOLN COMPARISON:  09/13/2012 head CT. 12/20/2019 MRI head. FINDINGS: CT HEAD FINDINGS Brain: Redemonstration of acute left basal ganglia infarct (5:16). Scattered and confluent periventricular and deep white matter  hypodensities are nonspecific however commonly associated with chronic microvascular ischemic changes. No midline shift, ventriculomegaly or extra-axial fluid collection. No mass lesion. No intracranial hemorrhage. Skull: Normal. Negative for fracture or focal lesion. Sinuses: Tiny bilateral maxillary sinus mucous retention cysts. No mastoid effusion. Orbits: Sequela of bilateral lens replacement. Review of the MIP images confirms the above findings CTA NECK FINDINGS Aortic arch: Three vessel aortic arch with mild calcified atheromatous plaque. Imaged portion shows no evidence of aneurysm or dissection. No significant stenosis of the major arch vessel origins. Right carotid system: Retropharyngeal course of the right common and  proximal internal carotid arteries. Mild calcified and noncalcified atheromatous plaque at the carotid bifurcation without significant luminal narrowing. No evidence of dissection or aneurysm. Mild tortuosity of the right ICA cervical segment. Left carotid system: Mild calcified and noncalcified atheromatous plaque at the carotid bifurcation without significant luminal narrowing. No evidence of dissection or aneurysm. Moderate tortuosity of the left ICA cervical segment. Vertebral arteries: Codominant. No evidence of dissection, stenosis (50% or greater) or occlusion. Skeleton: Multilevel spondylosis with straightening of cervical lordosis. No acute or suspicious osseous lesions. Other neck: Subcentimeter bilateral thyroid hypodensities. No adenopathy. Upper chest: Dependent subsegmental atelectasis. Biapical pleuroparenchymal scarring with pleural calcifications, consistent with post infectious sequela. Review of the MIP images confirms the above findings CTA HEAD FINDINGS Anterior circulation: No significant stenosis, proximal occlusion, aneurysm, or vascular malformation. Minimal bilateral carotid siphon atherosclerotic calcifications. Posterior circulation: No significant stenosis,  proximal occlusion, aneurysm, or vascular malformation. Codominant vertebral arteries. Mild left V4 segment atherosclerotic calcifications Venous sinuses: As permitted by contrast timing, patent. Anatomic variants: Fetal origin of the right PCA. Review of the MIP images confirms the above findings IMPRESSION: Redemonstration of acute left basal ganglia infarct and moderate chronic small vessel ischemic changes. No dissection or aneurysm. Mild atheromatous disease involving the carotid siphons, carotid bifurcations, left V4 segment and aortic arch. No high-grade narrowing. Fetal origin of the right PCA, normal anatomic variant. Symmetric biapical curvilinear pleural calcifications reflect post infectious scarring. No follow-up is necessary. Electronically Signed   By: Primitivo Gauze M.D.   On: 12/21/2019 11:54   DG Chest 2 View  Result Date: 12/20/2019 CLINICAL DATA:  Weakness EXAM: CHEST - 2 VIEW COMPARISON:  None. FINDINGS: The heart size and mediastinal contours are within normal limits. Atherosclerotic calcification of the aortic knob. No focal airspace consolidation, pleural effusion, or pneumothorax. Degenerative changes of the shoulders, left worse than right. IMPRESSION: No active cardiopulmonary disease. Electronically Signed   By: Davina Poke D.O.   On: 12/20/2019 12:23   CT ANGIO NECK W OR WO CONTRAST  Result Date: 12/21/2019 CLINICAL DATA:  Stroke, follow-up. EXAM: CT ANGIOGRAPHY HEAD AND NECK TECHNIQUE: Multidetector CT imaging of the head and neck was performed using the standard protocol during bolus administration of intravenous contrast. Multiplanar CT image reconstructions and MIPs were obtained to evaluate the vascular anatomy. Carotid stenosis measurements (when applicable) are obtained utilizing NASCET criteria, using the distal internal carotid diameter as the denominator. CONTRAST:  73mL OMNIPAQUE IOHEXOL 350 MG/ML SOLN COMPARISON:  09/13/2012 head CT. 12/20/2019 MRI head.  FINDINGS: CT HEAD FINDINGS Brain: Redemonstration of acute left basal ganglia infarct (5:16). Scattered and confluent periventricular and deep white matter hypodensities are nonspecific however commonly associated with chronic microvascular ischemic changes. No midline shift, ventriculomegaly or extra-axial fluid collection. No mass lesion. No intracranial hemorrhage. Skull: Normal. Negative for fracture or focal lesion. Sinuses: Tiny bilateral maxillary sinus mucous retention cysts. No mastoid effusion. Orbits: Sequela of bilateral lens replacement. Review of the MIP images confirms the above findings CTA NECK FINDINGS Aortic arch: Three vessel aortic arch with mild calcified atheromatous plaque. Imaged portion shows no evidence of aneurysm or dissection. No significant stenosis of the major arch vessel origins. Right carotid system: Retropharyngeal course of the right common and proximal internal carotid arteries. Mild calcified and noncalcified atheromatous plaque at the carotid bifurcation without significant luminal narrowing. No evidence of dissection or aneurysm. Mild tortuosity of the right ICA cervical segment. Left carotid system: Mild calcified and noncalcified atheromatous plaque at the carotid bifurcation without significant  luminal narrowing. No evidence of dissection or aneurysm. Moderate tortuosity of the left ICA cervical segment. Vertebral arteries: Codominant. No evidence of dissection, stenosis (50% or greater) or occlusion. Skeleton: Multilevel spondylosis with straightening of cervical lordosis. No acute or suspicious osseous lesions. Other neck: Subcentimeter bilateral thyroid hypodensities. No adenopathy. Upper chest: Dependent subsegmental atelectasis. Biapical pleuroparenchymal scarring with pleural calcifications, consistent with post infectious sequela. Review of the MIP images confirms the above findings CTA HEAD FINDINGS Anterior circulation: No significant stenosis, proximal occlusion,  aneurysm, or vascular malformation. Minimal bilateral carotid siphon atherosclerotic calcifications. Posterior circulation: No significant stenosis, proximal occlusion, aneurysm, or vascular malformation. Codominant vertebral arteries. Mild left V4 segment atherosclerotic calcifications Venous sinuses: As permitted by contrast timing, patent. Anatomic variants: Fetal origin of the right PCA. Review of the MIP images confirms the above findings IMPRESSION: Redemonstration of acute left basal ganglia infarct and moderate chronic small vessel ischemic changes. No dissection or aneurysm. Mild atheromatous disease involving the carotid siphons, carotid bifurcations, left V4 segment and aortic arch. No high-grade narrowing. Fetal origin of the right PCA, normal anatomic variant. Symmetric biapical curvilinear pleural calcifications reflect post infectious scarring. No follow-up is necessary. Electronically Signed   By: Primitivo Gauze M.D.   On: 12/21/2019 11:54   MR BRAIN WO CONTRAST  Result Date: 12/20/2019 CLINICAL DATA:  Fatigue, ataxia EXAM: MRI HEAD WITHOUT CONTRAST TECHNIQUE: Multiplanar, multiecho pulse sequences of the brain and surrounding structures were obtained without intravenous contrast. COMPARISON:  None FINDINGS: Brain: There is a small acute infarction involving the left caudate body, posterior lentiform nucleus, and intervening white matter. Patchy and confluent areas of T2 hyperintensity in the supratentorial white matter are nonspecific but may reflect moderate chronic microvascular ischemic changes. There is no intracranial mass, mass effect, or edema. There is no hydrocephalus or extra-axial fluid collection. Prominence of the ventricles and sulci reflects mild generalized parenchymal volume loss. Vascular: Major vessel flow voids at the skull base are preserved. Skull and upper cervical spine: Normal marrow signal is preserved. Sinuses/Orbits: Trace mucosal thickening. Bilateral lens  replacements. Other: Sella is unremarkable.  Mastoid air cells are clear. IMPRESSION: Acute infarction involving the left basal ganglia and adjacent white matter. Moderate chronic microvascular ischemic changes. Electronically Signed   By: Macy Mis M.D.   On: 12/20/2019 15:37   ECHOCARDIOGRAM COMPLETE  Result Date: 12/21/2019    ECHOCARDIOGRAM REPORT   Patient Name:   BIRKLEY VARAS [facility] Date of Exam: 12/21/2019 Medical Rec #:  [id number]         Height:       61.0 in Accession #:    [id number]        Weight:       110.0 lb Date of Birth:  Jul 18, 1930         BSA:          1.465 m Patient Age:    52 years          BP:           126/81 mmHg Patient Gender: F                 HR:           88 bpm. Exam Location:  Inpatient Procedure: 2D Echo, Cardiac Doppler and Color Doppler Indications:    Stroke 434.91 / I163.9  History:        Patient has no prior history of Echocardiogram examinations.  Risk Factors:Hypertension.  Sonographer:    Jonelle Sidle Dance Referring Phys: [id number] [place]  1. Left ventricular ejection fraction, by estimation, is 70 to 75%. The left ventricle has hyperdynamic function. The left ventricle has no regional wall motion abnormalities. Left ventricular diastolic parameters are consistent with Grade I diastolic dysfunction (impaired relaxation).  2. Right ventricular systolic function is normal. The right ventricular size is normal. There is normal pulmonary artery systolic pressure.  3. The mitral valve is normal in structure. No evidence of mitral valve regurgitation. No evidence of mitral stenosis.  4. The aortic valve is normal in structure. Aortic valve regurgitation is not visualized. Mild aortic valve sclerosis is present, with no evidence of aortic valve stenosis.  5. The inferior vena cava is normal in size with greater than 50% respiratory variability, suggesting right atrial pressure of 3 mmHg. Conclusion(s)/Recommendation(s): No intracardiac source of  embolism detected on this transthoracic study. A transesophageal echocardiogram is recommended to exclude cardiac source of embolism if clinically indicated. FINDINGS  Left Ventricle: Left ventricular ejection fraction, by estimation, is 70 to 75%. The left ventricle has hyperdynamic function. The left ventricle has no regional wall motion abnormalities. The left ventricular internal cavity size was normal in size. There is no left ventricular hypertrophy. Left ventricular diastolic parameters are consistent with Grade I diastolic dysfunction (impaired relaxation). Right Ventricle: The right ventricular size is normal. No increase in right ventricular wall thickness. Right ventricular systolic function is normal. There is normal pulmonary artery systolic pressure. The tricuspid regurgitant velocity is 2.51 m/s, and  with an assumed right atrial pressure of 3 mmHg, the estimated right ventricular systolic pressure is [id number] mmHg. Left Atrium: Left atrial size was normal in size. Right Atrium: Right atrial size was normal in size. Pericardium: There is no evidence of pericardial effusion. Mitral Valve: The mitral valve is normal in structure. Normal mobility of the mitral valve leaflets. No evidence of mitral valve regurgitation. No evidence of mitral valve stenosis. Tricuspid Valve: The tricuspid valve is normal in structure. Tricuspid valve regurgitation is mild . No evidence of tricuspid stenosis. Aortic Valve: The aortic valve is normal in structure. Aortic valve regurgitation is not visualized. Mild aortic valve sclerosis is present, with no evidence of aortic valve stenosis. Pulmonic Valve: The pulmonic valve was normal in structure. Pulmonic valve regurgitation is trivial. No evidence of pulmonic stenosis. Aorta: The aortic root is normal in size and structure. Venous: The inferior vena cava is normal in size with greater than 50% respiratory variability, suggesting right atrial pressure of 3 mmHg. IAS/Shunts: No  atrial level shunt detected by color flow Doppler.  LEFT VENTRICLE PLAX 2D LVIDd:         3.20 cm LVIDs:         1.70 cm LV PW:         1.00 cm LV IVS:        1.00 cm LVOT diam:     1.90 cm LV SV:         59 LV SV Index:   40 LVOT Area:     2.84 cm  RIGHT VENTRICLE             IVC RV Basal diam:  2.10 cm     IVC diam: 1.50 cm RV S prime:     12.90 cm/s TAPSE (M-mode): 1.6 cm LEFT ATRIUM             Index       RIGHT ATRIUM  Index LA diam:        3.00 cm 2.05 cm/m  RA Area:     9.98 cm LA Vol (A2C):   22.2 ml 15.15 ml/m RA Volume:   19.50 ml 13.31 ml/m LA Vol (A4C):   22.1 ml 15.09 ml/m LA Biplane Vol: 23.5 ml 16.04 ml/m  AORTIC VALVE LVOT Vmax:   109.00 cm/s LVOT Vmean:  75.350 cm/s LVOT VTI:    0.207 m  AORTA Ao Root diam: 2.70 cm Ao Asc diam:  2.80 cm MITRAL VALVE                TRICUSPID VALVE MV Area (PHT): 2.76 cm     TR Peak grad:   25.2 mmHg MV Decel Time: 275 msec     TR Vmax:        251.00 cm/s MV E velocity: 63.00 cm/s MV A velocity: 115.00 cm/s  SHUNTS MV E/A ratio:  0.55         Systemic VTI:  0.21 m                             Systemic Diam: 1.90 cm Candee Furbish MD Electronically signed by Candee Furbish MD Signature Date/Time: 12/21/2019/2:01:08 PM    Final     Microbiology: Recent Results (from the past 240 hour(s))  SARS CORONAVIRUS 2 (TAT 6-24 HRS) Nasopharyngeal Nasopharyngeal Swab     Status: None   Collection Time: 12/20/19  4:34 PM   Specimen: Nasopharyngeal Swab  Result Value Ref Range Status   SARS Coronavirus 2 NEGATIVE NEGATIVE Final    Comment: (NOTE) SARS-CoV-2 target nucleic acids are NOT DETECTED. The SARS-CoV-2 RNA is generally detectable in upper and lower respiratory specimens during the acute phase of infection. Negative results do not preclude SARS-CoV-2 infection, do not rule out co-infections with other pathogens, and should not be used as the sole basis for treatment or other patient management decisions. Negative results must be combined with clinical  observations, patient history, and epidemiological information. The expected result is Negative. Fact Sheet for Patients: SugarRoll.be Fact Sheet for Healthcare Providers: https://www.woods-mathews.com/ This test is not yet approved or cleared by the [place] FDA and  has been authorized for detection and/or diagnosis of SARS-CoV-2 by FDA under an Emergency Use Authorization (EUA). This EUA will remain  in effect (meaning this test can be used) for the duration of the COVID-19 declaration under Section 56 4(b)(1) of the Act, 21 U.S.C. section 360bbb-3(b)(1), unless the authorization is terminated or revoked sooner. Performed at [facility], [place] [address], [place], [place] [address]      Labs: Basic Metabolic Panel: Recent Labs  Lab 12/20/19 1140  NA 138  K 4.4  CL 102  CO2 26  GLUCOSE 106*  BUN 12  CREATININE 0.96  CALCIUM 9.9   Liver Function Tests: Recent Labs  Lab 12/20/19 1140  AST 32  ALT 16  ALKPHOS 71  BILITOT 0.7  PROT 7.0  ALBUMIN 4.0   No results for input(s): LIPASE, AMYLASE in the last 168 hours. No results for input(s): AMMONIA in the last 168 hours. CBC: Recent Labs  Lab 12/20/19 1140  WBC 5.7  NEUTROABS 3.1  HGB 12.6  HCT 38.6  MCV 93.9  PLT 244   Cardiac Enzymes: No results for input(s): CKTOTAL, CKMB, CKMBINDEX, TROPONINI in the last 168 hours. BNP: BNP (last 3 results) No results for input(s): BNP in the last  8760 hours.  ProBNP (last 3 results) No results for input(s): PROBNP in the last 8760 hours.  CBG: No results for input(s): GLUCAP in the last 168 hours.  Active Problems:   CVA (cerebral vascular accident) [facility])   Time coordinating discharge: 38 minutes  Signed:        Zadin Lange, DO [facility]  12/23/2019, 12:53 PM

## 2019-12-23 NOTE — Plan of Care (Signed)
  Problem: Coping: Goal: Will verbalize positive feelings about self Outcome: Progressing   Problem: Coping: Goal: Will verbalize positive feelings about self Outcome: Progressing   Problem: Education: Goal: Knowledge of patient specific risk factors addressed and post discharge goals established will improve Outcome: Progressing   Problem: Education: Goal: Knowledge of disease or condition will improve Outcome: Progressing

## 2019-12-23 NOTE — TOC Progression Note (Signed)
Transition of Care [facility]) - Progression Note    Patient Details  Name: Christina Escobar MRN: [id number] Date of Birth: April 27, 1930  Transition of Care [facility]) CM/SW [place], [place] Phone Number: 12/23/2019, 12:09 PM  Clinical Narrative:    CSW made telephone call to [facility] with [facility]. CSW confirmed patient is able to discharge to facility today once the discharge summary is complete. CSW will continue to follow and assist with discharge planning needs.  Expected Discharge Plan: [place] Barriers to Discharge: Continued Medical Work up  Expected Discharge Plan and Services Expected Discharge Plan: [place] Choice: [place] arrangements for the past 2 months: [place]                                       Social Determinants of Health (SDOH) Interventions    Readmission Risk Interventions No flowsheet data found.

## 2019-12-23 NOTE — TOC Transition Note (Signed)
Transition of Care [facility]) - CM/SW Discharge Note   Patient Details  Name: Christina Escobar MRN: [id number] Date of Birth: 1929-12-20  Transition of Care [facility]) CM/SW Contact:  Jacquelynn Cree Phone Number: 12/23/2019, 1:17 PM   Clinical Narrative:    Patient will DC to: [facility] at [place] date: 12/23/19 Family notified: [name]  Transport by: Corey Harold   Per MD patient ready for DC to [facility] at [place]. RN, patient, patient's family, and facility notified of DC. Discharge Summary and FL2 sent to facility. RN to call report prior to discharge [phone] ext [phone]). DC packet on chart. Ambulance transport requested for patient.   CSW will sign off for now as social work intervention is no longer needed. Please consult [place] again if new needs arise.    Final next level of care: Skilled Nursing Facility Barriers to Discharge: No Barriers Identified   Patient Goals and CMS Choice Patient states their goals for this hospitalization and ongoing recovery are:: To return home CMS Medicare.gov Compare Post Acute Care list provided to:: Patient Choice offered to / list presented to : Patient  Discharge Placement              Patient [facility] bed at: [facility] Patient to be transferred to facility by: [place] Name of family member notified: [name] Patient and family notified of of transfer: 12/23/19  Discharge Plan and Services     Post Acute Care Choice: [place]                               Social Determinants of Health (SDOH) Interventions     Readmission Risk Interventions No flowsheet data found.

## 2020-01-31 ENCOUNTER — Institutional Professional Consult (permissible substitution): Payer: Medicare Other | Admitting: Neurology

## 2020-02-13 ENCOUNTER — Encounter: Payer: Self-pay | Admitting: Psychiatry

## 2020-02-13 ENCOUNTER — Ambulatory Visit (INDEPENDENT_AMBULATORY_CARE_PROVIDER_SITE_OTHER): Payer: Medicare Other | Admitting: Psychiatry

## 2020-02-13 ENCOUNTER — Other Ambulatory Visit: Payer: Self-pay

## 2020-02-13 DIAGNOSIS — F411 Generalized anxiety disorder: Secondary | ICD-10-CM | POA: Diagnosis not present

## 2020-02-13 DIAGNOSIS — F13939 Sedative, hypnotic or anxiolytic use, unspecified with withdrawal, unspecified: Secondary | ICD-10-CM

## 2020-02-13 DIAGNOSIS — F5105 Insomnia due to other mental disorder: Secondary | ICD-10-CM

## 2020-02-13 DIAGNOSIS — F13239 Sedative, hypnotic or anxiolytic dependence with withdrawal, unspecified: Secondary | ICD-10-CM | POA: Diagnosis not present

## 2020-02-13 MED ORDER — ESCITALOPRAM OXALATE 5 MG PO TABS: 5.0000 mg | ORAL_TABLET | Freq: Every day | ORAL | 0 refills | Status: DC

## 2020-02-13 NOTE — Progress Notes (Signed)
Christina Escobar [id number] [birth year] 86 y.o.  Subjective:   Patient ID:  Christina Escobar is a 86 y.o. (DOB 12-Aug-1930) female.  Chief Complaint:  Chief Complaint  Patient presents with  . Follow-up  . Stress    health problems  . Anxiety    Anxiety Symptoms include nausea and nervous/anxious behavior. Patient reports no confusion, decreased concentration or suicidal ideas.    Depression        Associated symptoms include fatigue.  Associated symptoms include no decreased concentration and no suicidal ideas.  Past medical history includes anxiety.    Christina Escobar presents to the office today for follow-up of chronic anxiety.    Seen December 2020.  No meds were changed.  QT DT Covid.  Big sadness that [name] moving to [place].     Ambien used rarely. Not used #30 since Feb.  02/13/20 appt the following noted: Stroke 12/17/19 and hosp 4 days.  Then went to [facility] and upset she was given the wrong meds.  They reduced lorazepam from baseline to 0.5 mg BID and pt had NV from it and felt anxious.  These sx resolved with the return to lorazepam to 1 mg AM and 8 PM and 0.5 mg at 5 PM.    Upset she saw mice in the room.  After about a week had a fall bc of the mouse.   I can't get over it RE: poor health care from [facility].   Has since resumed normal dose of lorazepam. Still on mirtazapine 45 mg nightly. Hard time with decisions.  Not confident.  Hard to enjoy normal things.  Low energy. Fears ever having to go back to [employer].  Traumatized by the experience.  Always tense.  Youngest son's D is coming to [facility] soon.  Coming from [place].  Past Psychiatric Medication Trials:  Mirtazapine, nefazodone SE, Ativan, Ambien  Review of Systems:  Review of Systems  Constitutional: Positive for fatigue.  Gastrointestinal: Positive for abdominal pain and nausea.       Frequent BM  Musculoskeletal: Positive for gait problem.  Neurological:  Positive for tremors and weakness.  Psychiatric/Behavioral: Positive for depression and sleep disturbance. Negative for agitation, behavioral problems, confusion, decreased concentration, dysphoric mood, hallucinations, self-injury and suicidal ideas. The patient is nervous/anxious. The patient is not hyperactive.   GI px managed.  Medications: I have reviewed the patient's current medications.  Current Outpatient Medications  Medication Sig Dispense Refill  . acetaminophen (TYLENOL) 325 MG tablet Take 325-650 mg by mouth every 8 (eight) hours as needed for mild pain or headache.     Marland Kitchen amLODipine (NORVASC) 5 MG tablet Take 1 tablet (5 mg total) by mouth daily. 30 tablet 0  . aspirin EC 81 MG EC tablet Take 1 tablet (81 mg total) by mouth daily. 30 tablet 0  . atorvastatin (LIPITOR) 40 MG tablet Take 1 tablet (40 mg total) by mouth daily at 6 PM. 30 tablet 0  . Calcium Carbonate-Vitamin D3 (CALCIUM 600-D) 600-400 MG-UNIT TABS Take 1 tablet by mouth daily.     . clopidogrel (PLAVIX) 75 MG tablet Take 1 tablet (75 mg total) by mouth daily. 21 tablet 0  . escitalopram (LEXAPRO) 5 MG tablet Take 1 tablet (5 mg total) by mouth daily. 90 tablet 0  . fluticasone (FLONASE) 50 MCG/ACT nasal spray Place 1 spray into both nostrils at bedtime.    Marland Kitchen loratadine (CLARITIN) 10 MG tablet Take 10 mg by  mouth daily as needed for allergies or rhinitis.     Marland Kitchen LORazepam (ATIVAN) 0.5 MG tablet Take 0.5 mg by mouth. 1 tablet daily at 5 PM    . LORazepam (ATIVAN) 1 MG tablet Take 1 tablet (1 mg total) by mouth 2 (two) times daily. (Patient taking differently: Take 1 mg by mouth. 1 in AM and night) 60 tablet 0  . Methylcellulose, Laxative, (CITRUCEL PO) Take by mouth See admin instructions. Mix 4.5 heaping teaspoonsful of powder into 8 ounces of water and drink once a day at bedtime    . mirtazapine (REMERON) 45 MG tablet Take 1 tablet (45 mg total) by mouth at bedtime. 30 tablet 11  . Multiple Vitamin (MULTIVITAMIN WITH  MINERALS) TABS tablet Take 1 tablet by mouth daily.    . mupirocin ointment (BACTROBAN) 2 % Apply 1 application topically See admin instructions. Apply to the right arm 2 times a day    . phenylephrine-shark liver oil-mineral oil-petrolatum (PREPARATION H) 0.25-14-74.9 % rectal ointment Place 1 application rectally 2 (two) times daily as needed for hemorrhoids.    . quinapril (ACCUPRIL) 40 MG tablet Take 40 mg by mouth in the morning.     . simethicone (MYLICON) 0000000 MG chewable tablet Chew 125 mg by mouth every 6 (six) hours as needed for flatulence.    . SUMAtriptan (IMITREX) 25 MG tablet Take 25 mg by mouth once as needed for migraine.      No current facility-administered medications for this visit.    Medication Side Effects: Other: ? sedation related.  Allergies:  Allergies  Allergen Reactions  . Adhesive [Tape] Other (See Comments)    Caused SEVERE skin irritation  . Cefdinir Other (See Comments)    Caused C-Diff  . Amitiza [Lubiprostone] Diarrhea  . Erythromycin Nausea And Vomiting  . Flagyl [Metronidazole] Diarrhea  . Meloxicam Other (See Comments)    Dizziness resulted after taking it     Past Medical History:  Diagnosis Date  . Allergy   . Anal fissure   . Anxiety   . Basal cell carcinoma   . Cataracts, bilateral   . Clostridium difficile diarrhea   . Depression   . Diverticulosis    hx of stricture in colon  . Hemorrhoids   . Hypertension   . IBS (irritable bowel syndrome)   . Insomnia   . Migraine   . Osteopenia   . Thrombosed hemorrhoids     Family History  Problem Relation Age of Onset  . Alzheimer's disease Mother   . Heart failure Mother   . Heart disease Father   . Heart failure Father   . Other Maternal Grandfather        cerebral hemorrhage  . Skin cancer Brother   . Heart disease Brother   . Colon cancer Neg Hx   . Stomach cancer Neg Hx   . Pancreatic cancer Neg Hx     Social History   Socioeconomic History  . Marital status: Married     Spouse name: Paediatric nurse  . Number of children: 2  . Years of education: MA  . Highest education level: Not on file  Occupational History  . Occupation: retired Pharmacist, hospital  Tobacco Use  . Smoking status: Never Smoker  . Smokeless tobacco: Never Used  Substance and Sexual Activity  . Alcohol use: Yes    Alcohol/week: 0.0 standard drinks    Comment: occasional glass of wine -2 per week  . Drug use: No  . Sexual activity: Not  on file  Other Topics Concern  . Not on file  Social History Narrative   Pt lives at home with her Spouse. She has 3 children, (1 adopted). Spouse is a retired Film/video editor.   Caffeine Use: 1-2 cups daily.   Social Determinants of Health   Financial Resource Strain:   . Difficulty of Paying Living Expenses:   Food Insecurity:   . Worried About Charity fundraiser in the Last Year:   . Arboriculturist in the Last Year:   Transportation Needs:   . Film/video editor (Medical):   Marland Kitchen Lack of Transportation (Non-Medical):   Physical Activity:   . Days of Exercise per Week:   . Minutes of Exercise per Session:   Stress:   . Feeling of Stress :   Social Connections:   . Frequency of Communication with Friends and Family:   . Frequency of Social Gatherings with Friends and Family:   . Attends Religious Services:   . Active Member of Clubs or Organizations:   . Attends Archivist Meetings:   Marland Kitchen Marital Status:   Intimate Partner Violence:   . Fear of Current or Ex-Partner:   . Emotionally Abused:   Marland Kitchen Physically Abused:   . Sexually Abused:     Past Medical History, Surgical history, Social history, and Family history were reviewed and updated as appropriate.   Please see review of systems for further details on the patient's review from today.   Objective:   Physical Exam:  There were no vitals taken for this visit.  Physical Exam Constitutional:      Appearance: Normal appearance.  Neurological:     Mental Status: She is alert.      Motor: Weakness present. No tremor.     Gait: Gait abnormal.     Comments: Using walker  Psychiatric:        Attention and Perception: Attention and perception normal.        Mood and Affect: Mood is anxious. Mood is not depressed.        Speech: Speech normal. Speech is not slurred.        Behavior: Behavior is cooperative.        Thought Content: Thought content is not delusional. Thought content does not include homicidal or suicidal ideation.        Cognition and Memory: Cognition normal.     Comments: Insight and judgment good. Chronic anxiety was worse with reduction of lorazepam.     Lab Review:     Component Value Date/Time   NA 138 12/20/2019 1140   K 4.4 12/20/2019 1140   CL 102 12/20/2019 1140   CO2 26 12/20/2019 1140   GLUCOSE 106 (H) 12/20/2019 1140   BUN 12 12/20/2019 1140   CREATININE 0.96 12/20/2019 1140   CALCIUM 9.9 12/20/2019 1140   PROT 7.0 12/20/2019 1140   ALBUMIN 4.0 12/20/2019 1140   AST 32 12/20/2019 1140   ALT 16 12/20/2019 1140   ALKPHOS 71 12/20/2019 1140   BILITOT 0.7 12/20/2019 1140   GFRNONAA 53 (L) 12/20/2019 1140   GFRAA >60 12/20/2019 1140       Component Value Date/Time   WBC 5.7 12/20/2019 1140   RBC 4.11 12/20/2019 1140   HGB 12.6 12/20/2019 1140   HCT 38.6 12/20/2019 1140   PLT 244 12/20/2019 1140   MCV 93.9 12/20/2019 1140   MCH 30.7 12/20/2019 1140   MCHC 32.6 12/20/2019 1140   RDW 11.5 12/20/2019  1140   LYMPHSABS 2.2 12/20/2019 1140   MONOABS 0.4 12/20/2019 1140   EOSABS 0.1 12/20/2019 1140   BASOSABS 0.1 12/20/2019 1140    No results found for: POCLITH, LITHIUM   No results found for: PHENYTOIN, PHENOBARB, VALPROATE, CBMZ   .res Assessment: Plan:    Shyleen was seen today for follow-up, stress and anxiety.  Diagnoses and all orders for this visit:  Generalized anxiety disorder -     escitalopram (LEXAPRO) 5 MG tablet; Take 1 tablet (5 mg total) by mouth daily.  Insomnia due to mental condition  Benzodiazepine  withdrawal, with unspecified complication (HCC)   Greater than 50% of 30 min face to face time with patient was spent on counseling and coordination of care. We discussed Ms. Magro has a long history of anxiety which in the past had contributed to unmanageable diarrhea another bowel symptoms including bowel pain.  These symptoms were much improved with a combination of lorazepam and mirtazapine.  She has some chronic insomnia which is generally manageable with the medication.  But her anxiety is much worse after the recent stroke complications..  We had been able to reduce the lorazepam some over the last couple of years and she is probably at the lowest tolerated dose and lowest effective dose.  The hospitalization triggered BZ withdrawal by reducing the dose from 2.5 mg daily to 1 mg daily.  These withdrawal sx have resolved with return to usual dose. Overall anxiety now is much worse.  No history of SSRI so should try low dose Lexapro 5 mg daily. Do not reduce lorazepam further  She takes lorazepam 1 mg at 7A and HS takes 0.5 mg  at 5 PM.  Sleep hygiene disc in detail and esp in relation to napping.  We discussed the short-term risks associated with benzodiazepines including sedation and increased fall risk among others.  Discussed long-term side effect risk including dependence, potential withdrawal symptoms, and the potential eventual dose-related risk of dementia.  But recent studies from 2020 dispute this association between benzodiazepines and dementia risk. Newer studies in 2020 do not support an association with dementia.  Use Ambien prn.  Disc risk amnesia.    She needs the meds   Supportive and cognitive therapy techniques around age-related declines that both she and her husband are experiencing and the anxiety that she has had in response.  She is still dealing with isolation and problems with it.  Isolation is causing some insomnia.    This appt was 30 mins.  FU 2  mos  Lynder Parents, MD, DFAPA   Please see After Visit Summary for patient specific instructions.  No future appointments.  No orders of the defined types were placed in this encounter.     -------------------------------

## 2020-02-27 ENCOUNTER — Telehealth: Payer: Self-pay | Admitting: Psychiatry

## 2020-02-27 NOTE — Telephone Encounter (Signed)
Holly left a message that she was told that /carey wanted to talk to her about Lanna Labella. She can be reached at [phone] or her cell at [phone]

## 2020-03-14 ENCOUNTER — Other Ambulatory Visit: Payer: Self-pay | Admitting: Psychiatry

## 2020-03-14 DIAGNOSIS — F411 Generalized anxiety disorder: Secondary | ICD-10-CM

## 2020-03-27 ENCOUNTER — Other Ambulatory Visit: Payer: Self-pay | Admitting: Psychiatry

## 2020-03-28 ENCOUNTER — Other Ambulatory Visit: Payer: Self-pay | Admitting: Psychiatry

## 2020-03-28 DIAGNOSIS — F411 Generalized anxiety disorder: Secondary | ICD-10-CM

## 2020-04-05 ENCOUNTER — Telehealth: Payer: Self-pay | Admitting: Psychiatry

## 2020-04-05 NOTE — Telephone Encounter (Signed)
Pt daughter Hollice Gong left message stating Pt husband passed and pt has become over focused and everything has to be exact. Very hard to deal with. Juliann Pulse understands release not signed to give her info, but asking if meds may need to be adjusted. [place] # please advise

## 2020-04-05 NOTE — Telephone Encounter (Signed)
She's scheduled for apt on 04/15/2020

## 2020-04-15 ENCOUNTER — Encounter: Payer: Self-pay | Admitting: Psychiatry

## 2020-04-15 ENCOUNTER — Other Ambulatory Visit: Payer: Self-pay

## 2020-04-15 ENCOUNTER — Ambulatory Visit (INDEPENDENT_AMBULATORY_CARE_PROVIDER_SITE_OTHER): Payer: Medicare Other | Admitting: Psychiatry

## 2020-04-15 DIAGNOSIS — F411 Generalized anxiety disorder: Secondary | ICD-10-CM

## 2020-04-15 DIAGNOSIS — F5105 Insomnia due to other mental disorder: Secondary | ICD-10-CM | POA: Diagnosis not present

## 2020-04-15 MED ORDER — LORAZEPAM 0.5 MG PO TABS
0.5000 mg | ORAL_TABLET | Freq: Every evening | ORAL | 1 refills | Status: DC
Start: 2020-04-15 — End: 2020-07-22

## 2020-04-15 MED ORDER — LORAZEPAM 1 MG PO TABS: ORAL_TABLET | ORAL | 1 refills | Status: DC

## 2020-04-15 MED ORDER — ZOLPIDEM TARTRATE 5 MG PO TABS: 5.0000 mg | ORAL_TABLET | Freq: Every evening | ORAL | 3 refills | Status: DC | PRN

## 2020-04-15 MED ORDER — ESCITALOPRAM OXALATE 5 MG PO TABS: 5.0000 mg | ORAL_TABLET | Freq: Every day | ORAL | 0 refills | Status: DC

## 2020-04-15 MED ORDER — MIRTAZAPINE 45 MG PO TABS: 45.0000 mg | ORAL_TABLET | Freq: Every day | ORAL | 1 refills | Status: DC

## 2020-04-15 NOTE — Progress Notes (Signed)
Christina Escobar [id number] 10-13-1929 86 y.o.  Subjective:   Patient ID:  Christina Escobar is a 86 y.o. (DOB 03/31/1930) female.  Chief Complaint:  Chief Complaint  Patient presents with  . Follow-up  . grief  . Anxiety  . Sleeping Problem    Anxiety Symptoms include nausea and nervous/anxious behavior. Patient reports no confusion, decreased concentration, palpitations or suicidal ideas.    Depression        Associated symptoms include fatigue.  Associated symptoms include no decreased concentration and no suicidal ideas.  Past medical history includes anxiety.    Christina Escobar presents to the office today for follow-up of chronic anxiety.    Seen December 2020.  No meds were changed.  QT DT Covid.  Big sadness that GD Christina Escobar moving to [place].     Ambien used rarely. Not used #30 since Feb.  02/13/20 appt the following noted: Stroke 12/17/19 and hosp 4 days.  Then went to [facility] and upset she was given the wrong meds.  They reduced lorazepam from baseline to 0.5 mg BID and pt had NV from it and felt anxious.  These sx resolved with the return to lorazepam to 1 mg AM and 8 PM and 0.5 mg at 5 PM.    Upset she saw mice in the room.  After about a week had a fall bc of the mouse.   I can't get over it RE: poor health care from [facility].   Has since resumed normal dose of lorazepam. Still on mirtazapine 45 mg nightly. Hard time with decisions.  Not confident.  Hard to enjoy normal things.  Low energy. Fears ever having to go back to [employer].  Traumatized by the experience.  Always tense. Plan: continue She takes lorazepam 1 mg at 7A and HS takes 0.5 mg  at 5 PM.  Add Lexapro 5 for anxiety  04/15/20 appt with the following noted: Lexapro has been a big help.  I think I it's helped.  Sleep better and handled H's death better than usual and less IBS.  Handles IBS better. H died 04-21-20. He was ready to die.  That helped me a lot.  He was  84 yo.  Had a lot of family support for awhile but family gone. If misses sleep then next night takes Ambien.  1 son in law locally and that's it. Lost the dog too bc can't care for it. Moving to assisted living at [facility].  Youngest son's D is coming to [facility] soon.  Coming from [place].  Past Psychiatric Medication Trials:  Mirtazapine, nefazodone SE, Ativan, Ambien  Review of Systems:  Review of Systems  Constitutional: Positive for fatigue.  Cardiovascular: Negative for palpitations.  Gastrointestinal: Positive for abdominal pain and nausea.       Frequent BM  Musculoskeletal: Positive for gait problem.  Neurological: Positive for tremors and weakness.  Psychiatric/Behavioral: Positive for depression and sleep disturbance. Negative for agitation, behavioral problems, confusion, decreased concentration, dysphoric mood, hallucinations, self-injury and suicidal ideas. The patient is nervous/anxious. The patient is not hyperactive.   GI px managed.  Medications: I have reviewed the patient's current medications.  Current Outpatient Medications  Medication Sig Dispense Refill  . acetaminophen (TYLENOL) 325 MG tablet Take 325-650 mg by mouth every 8 (eight) hours as needed for mild pain or headache.     Marland Kitchen amLODipine (NORVASC) 5 MG tablet Take 1 tablet (5 mg total) by mouth daily. 30 tablet  0  . aspirin EC 81 MG EC tablet Take 1 tablet (81 mg total) by mouth daily. 30 tablet 0  . atorvastatin (LIPITOR) 40 MG tablet Take 1 tablet (40 mg total) by mouth daily at 6 PM. 30 tablet 0  . Calcium Carbonate-Vitamin D3 (CALCIUM 600-D) 600-400 MG-UNIT TABS Take 1 tablet by mouth daily.     . clopidogrel (PLAVIX) 75 MG tablet Take 1 tablet (75 mg total) by mouth daily. 21 tablet 0  . fluticasone (FLONASE) 50 MCG/ACT nasal spray Place 1 spray into both nostrils at bedtime.    Marland Kitchen loratadine (CLARITIN) 10 MG tablet Take 10 mg by mouth daily as needed for allergies or rhinitis.     . Methylcellulose,  Laxative, (CITRUCEL PO) Take by mouth See admin instructions. Mix 4.5 heaping teaspoonsful of powder into 8 ounces of water and drink once a day at bedtime    . Multiple Vitamin (MULTIVITAMIN WITH MINERALS) TABS tablet Take 1 tablet by mouth daily.    . mupirocin ointment (BACTROBAN) 2 % Apply 1 application topically See admin instructions. Apply to the right arm 2 times a day    . phenylephrine-shark liver oil-mineral oil-petrolatum (PREPARATION H) 0.25-14-74.9 % rectal ointment Place 1 application rectally 2 (two) times daily as needed for hemorrhoids.    . quinapril (ACCUPRIL) 40 MG tablet Take 40 mg by mouth in the morning.     . simethicone (MYLICON) 096 MG chewable tablet Chew 125 mg by mouth every 6 (six) hours as needed for flatulence.    . SUMAtriptan (IMITREX) 25 MG tablet Take 25 mg by mouth once as needed for migraine.     Marland Kitchen escitalopram (LEXAPRO) 5 MG tablet Take 1 tablet (5 mg total) by mouth daily. 90 tablet 0  . LORazepam (ATIVAN) 0.5 MG tablet Take 1 tablet (0.5 mg total) by mouth every evening. 30 tablet 1  . LORazepam (ATIVAN) 1 MG tablet TAKE 1 TABLET BY MOUTH TWICE DAILY AT 6:30 AM AND 9 PM 60 tablet 1  . mirtazapine (REMERON) 45 MG tablet Take 1 tablet (45 mg total) by mouth at bedtime. 90 tablet 1  . zolpidem (AMBIEN) 5 MG tablet Take 1 tablet (5 mg total) by mouth at bedtime as needed. 30 tablet 3   No current facility-administered medications for this visit.    Medication Side Effects: Other: ? sedation related.  Allergies:  Allergies  Allergen Reactions  . Adhesive [Tape] Other (See Comments)    Caused SEVERE skin irritation  . Cefdinir Other (See Comments)    Caused C-Diff  . Amitiza [Lubiprostone] Diarrhea  . Erythromycin Nausea And Vomiting  . Flagyl [Metronidazole] Diarrhea  . Meloxicam Other (See Comments)    Dizziness resulted after taking it     Past Medical History:  Diagnosis Date  . Allergy   . Anal fissure   . Anxiety   . Basal cell carcinoma    . Cataracts, bilateral   . Clostridium difficile diarrhea   . Depression   . Diverticulosis    hx of stricture in colon  . Hemorrhoids   . Hypertension   . IBS (irritable bowel syndrome)   . Insomnia   . Migraine   . Osteopenia   . Thrombosed hemorrhoids     Family History  Problem Relation Age of Onset  . Alzheimer's disease Mother   . Heart failure Mother   . Heart disease Father   . Heart failure Father   . Other Maternal Grandfather  cerebral hemorrhage  . Skin cancer Brother   . Heart disease Brother   . Colon cancer Neg Hx   . Stomach cancer Neg Hx   . Pancreatic cancer Neg Hx     Social History   Socioeconomic History  . Marital status: Married    Spouse name: Paediatric nurse  . Number of children: 2  . Years of education: MA  . Highest education level: Not on file  Occupational History  . Occupation: retired Pharmacist, hospital  Tobacco Use  . Smoking status: Never Smoker  . Smokeless tobacco: Never Used  Vaping Use  . Vaping Use: Never used  Substance and Sexual Activity  . Alcohol use: Yes    Alcohol/week: 0.0 standard drinks    Comment: occasional glass of wine -2 per week  . Drug use: No  . Sexual activity: Not on file  Other Topics Concern  . Not on file  Social History Narrative   Pt lives at home with her Spouse. She has 3 children, (1 adopted). Spouse is a retired Film/video editor.   Caffeine Use: 1-2 cups daily.   Social Determinants of Health   Financial Resource Strain:   . Difficulty of Paying Living Expenses:   Food Insecurity:   . Worried About Charity fundraiser in the Last Year:   . Arboriculturist in the Last Year:   Transportation Needs:   . Film/video editor (Medical):   Marland Kitchen Lack of Transportation (Non-Medical):   Physical Activity:   . Days of Exercise per Week:   . Minutes of Exercise per Session:   Stress:   . Feeling of Stress :   Social Connections:   . Frequency of Communication with Friends and Family:   . Frequency of  Social Gatherings with Friends and Family:   . Attends Religious Services:   . Active Member of Clubs or Organizations:   . Attends Archivist Meetings:   Marland Kitchen Marital Status:   Intimate Partner Violence:   . Fear of Current or Ex-Partner:   . Emotionally Abused:   Marland Kitchen Physically Abused:   . Sexually Abused:     Past Medical History, Surgical history, Social history, and Family history were reviewed and updated as appropriate.   Please see review of systems for further details on the patient's review from today.   Objective:   Physical Exam:  There were no vitals taken for this visit.  Physical Exam Constitutional:      Appearance: Normal appearance.  Neurological:     Mental Status: She is alert.     Motor: Weakness present. No tremor.     Gait: Gait abnormal.     Comments: Using walker  Psychiatric:        Attention and Perception: Attention and perception normal.        Mood and Affect: Mood is anxious. Mood is not depressed.        Speech: Speech normal. Speech is not slurred.        Behavior: Behavior is cooperative.        Thought Content: Thought content is not delusional. Thought content does not include homicidal or suicidal ideation.        Cognition and Memory: Cognition normal.     Comments: Insight and judgment good. Chronic anxiety overall seems better with Lexapro 5 but grief      Lab Review:     Component Value Date/Time   NA 138 12/20/2019 1140   K 4.4 12/20/2019 1140  CL 102 12/20/2019 1140   CO2 26 12/20/2019 1140   GLUCOSE 106 (H) 12/20/2019 1140   BUN 12 12/20/2019 1140   CREATININE 0.96 12/20/2019 1140   CALCIUM 9.9 12/20/2019 1140   PROT 7.0 12/20/2019 1140   ALBUMIN 4.0 12/20/2019 1140   AST 32 12/20/2019 1140   ALT 16 12/20/2019 1140   ALKPHOS 71 12/20/2019 1140   BILITOT 0.7 12/20/2019 1140   GFRNONAA 53 (L) 12/20/2019 1140   GFRAA >60 12/20/2019 1140       Component Value Date/Time   WBC 5.7 12/20/2019 1140   RBC 4.11  12/20/2019 1140   HGB 12.6 12/20/2019 1140   HCT 38.6 12/20/2019 1140   PLT 244 12/20/2019 1140   MCV 93.9 12/20/2019 1140   MCH 30.7 12/20/2019 1140   MCHC 32.6 12/20/2019 1140   RDW 11.5 12/20/2019 1140   LYMPHSABS 2.2 12/20/2019 1140   MONOABS 0.4 12/20/2019 1140   EOSABS 0.1 12/20/2019 1140   BASOSABS 0.1 12/20/2019 1140    No results found for: POCLITH, LITHIUM   No results found for: PHENYTOIN, PHENOBARB, VALPROATE, CBMZ   .res Assessment: Plan:    Aily was seen today for follow-up, grief, anxiety and sleeping problem.  Diagnoses and all orders for this visit:  Generalized anxiety disorder -     escitalopram (LEXAPRO) 5 MG tablet; Take 1 tablet (5 mg total) by mouth daily. -     LORazepam (ATIVAN) 0.5 MG tablet; Take 1 tablet (0.5 mg total) by mouth every evening. -     LORazepam (ATIVAN) 1 MG tablet; TAKE 1 TABLET BY MOUTH TWICE DAILY AT 6:30 AM AND 9 PM -     mirtazapine (REMERON) 45 MG tablet; Take 1 tablet (45 mg total) by mouth at bedtime.  Insomnia due to mental condition -     zolpidem (AMBIEN) 5 MG tablet; Take 1 tablet (5 mg total) by mouth at bedtime as needed.   Greater than 50% of 30 min face to face time with patient was spent on counseling and coordination of care. We discussed Ms. Foley has a long history of anxiety which in the past had contributed to unmanageable diarrhea another bowel symptoms including bowel pain.  These symptoms were much improved with a combination of lorazepam and mirtazapine.  She has some chronic insomnia which is generally manageable with the medication.   We had been able to reduce the lorazepam some over the last couple of years and she is probably at the lowest tolerated dose and lowest effective dose.  No history of SSRI added low dose Lexapro 5 mg daily.  She feels it's helped a lot.  Do not reduce lorazepam further She takes lorazepam 1 mg at 7A and HS takes 0.5 mg  at 5 PM.  Sleep hygiene disc in detail and esp in  relation to napping.  We discussed the short-term risks associated with benzodiazepines including sedation and increased fall risk among others.  Discussed long-term side effect risk including dependence, potential withdrawal symptoms, and the potential eventual dose-related risk of dementia.  But recent studies from 2020 dispute this association between benzodiazepines and dementia risk. Newer studies in 2020 do not support an association with dementia.  Use Ambien prn.  Disc risk amnesia.    She needs the meds   Supportive and cognitive therapy techniques around age-related declines that both she and loss of husband.  This appt was 30 mins.  FU 4 mos  Lynder Parents, MD, DFAPA   Please  see After Visit Summary for patient specific instructions.  No future appointments.  No orders of the defined types were placed in this encounter.     -------------------------------

## 2020-04-30 ENCOUNTER — Telehealth: Payer: Self-pay | Admitting: Psychiatry

## 2020-04-30 NOTE — Telephone Encounter (Signed)
Pt daughter called with big concerns about her mother. She said that she is beginning to act out of charter. She also is making poor decisions. She wants to know what they can do. Daughter not on [employer] forms

## 2020-05-01 NOTE — Telephone Encounter (Signed)
Called and spoke with daughter, she is not on consent per office staff. She reported a lot going on with her mother, feels she's acting way out of character. She thought maybe some of this was from her previous stroke but not sure. She did say that patient's husband reported that things were off with her prior to his passing in July. She is not wanting her children involved in things, she gave away their family dog of 14 years to the vet assistant, when several family members offered to take the dog. They did not know ahead of time. She's very negative with her family. Patient did meet with someone about staying in independent living verses assisted living but she has not shared with any of her children. The family is upset and not sure where to turn. Especially since patient isn't aware of her behavior.   Informed daughter I would relay all this information and we would follow up.

## 2020-05-01 NOTE — Telephone Encounter (Signed)
Noted.  There has been some longstanding conflict of some sort between the D and pt predating H's death.  I'm unclear on it's nature but pt is competent based on the info available to me and not further action is warranted.

## 2020-06-10 ENCOUNTER — Other Ambulatory Visit: Payer: Self-pay | Admitting: Psychiatry

## 2020-06-10 DIAGNOSIS — F411 Generalized anxiety disorder: Secondary | ICD-10-CM

## 2020-06-12 NOTE — Telephone Encounter (Signed)
Pt left a message and said that she has been out of her medicine for two days now. Please send in the ativan 1 mg to the pharmacy

## 2020-07-22 ENCOUNTER — Other Ambulatory Visit: Payer: Self-pay | Admitting: Psychiatry

## 2020-07-22 DIAGNOSIS — F411 Generalized anxiety disorder: Secondary | ICD-10-CM

## 2020-07-22 NOTE — Telephone Encounter (Signed)
Next apt 08/19/20

## 2020-08-19 ENCOUNTER — Encounter: Payer: Self-pay | Admitting: Psychiatry

## 2020-08-19 ENCOUNTER — Ambulatory Visit (INDEPENDENT_AMBULATORY_CARE_PROVIDER_SITE_OTHER): Payer: Medicare Other | Admitting: Psychiatry

## 2020-08-19 ENCOUNTER — Other Ambulatory Visit: Payer: Self-pay

## 2020-08-19 DIAGNOSIS — F411 Generalized anxiety disorder: Secondary | ICD-10-CM

## 2020-08-19 DIAGNOSIS — F5105 Insomnia due to other mental disorder: Secondary | ICD-10-CM

## 2020-08-19 MED ORDER — MIRTAZAPINE 45 MG PO TABS: 45.0000 mg | ORAL_TABLET | Freq: Every day | ORAL | 1 refills | Status: DC

## 2020-08-19 MED ORDER — LORAZEPAM 1 MG PO TABS: ORAL_TABLET | ORAL | 5 refills | Status: DC

## 2020-08-19 MED ORDER — LORAZEPAM 0.5 MG PO TABS: 0.5000 mg | ORAL_TABLET | Freq: Every evening | ORAL | 5 refills | Status: DC

## 2020-08-19 NOTE — Progress Notes (Signed)
Christina Escobar [id number] Feb 09, 1930 86 y.o.  Subjective:   Patient ID:  Christina Escobar is a 86 y.o. (DOB Sep 27, 1929) female.  Chief Complaint:  Chief Complaint  Patient presents with  . Follow-up  . Anxiety  . Stress    Anxiety Symptoms include nervous/anxious behavior. Patient reports no confusion, decreased concentration, nausea, palpitations or suicidal ideas.    Depression        Associated symptoms include fatigue.  Associated symptoms include no decreased concentration and no suicidal ideas.  Past medical history includes anxiety.    Christina Escobar presents to the office today for follow-up of chronic anxiety.    Seen December 2020.  No meds were changed.  QT DT Covid.  Big sadness that GD Christina Escobar moving to [place].     Ambien used rarely. Not used #30 since Feb.  02/13/20 appt the following noted: Stroke 12/17/19 and hosp 4 days.  Then went to [facility] and upset she was given the wrong meds.  They reduced lorazepam from baseline to 0.5 mg BID and pt had NV from it and felt anxious.  These sx resolved with the return to lorazepam to 1 mg AM and 8 PM and 0.5 mg at 5 PM.    Upset she saw mice in the room.  After about a week had a fall bc of the mouse.   I can't get over it RE: poor health care from [facility].   Has since resumed normal dose of lorazepam. Still on mirtazapine 45 mg nightly. Hard time with decisions.  Not confident.  Hard to enjoy normal things.  Low energy. Fears ever having to go back to [employer].  Traumatized by the experience.  Always tense. Plan: continue She takes lorazepam 1 mg at 7A and HS takes 0.5 mg  at 5 PM.  Add Lexapro 5 for anxiety  04/15/20 appt with the following noted: Lexapro has been a big help.  I think I it's helped.  Sleep better and handled H's death better than usual and less IBS.  Handles IBS better. H died April 20, 2020. He was ready to die.  That helped me a lot.  He was 84 yo.  Had a lot of  family support for awhile but family gone. If misses sleep then next night takes Ambien.  1 son in law locally and that's it. Lost the dog too bc can't care for it. Moving to assisted living at [facility].  08/19/20 appt with following noted; Got real upset with move to apt.  I just can't accept it.  Asked to have Lexapro increased by PCP to 10 mg daily about 3 weeks ago.  It's really helped the IBS better than any med so far.  Really upset with various things and has done fine with previous moves.  Things damaged and lost. Family says she should just let it go but can't. Wonders about getting a PCP with geriatric experience.   Son will visit at Christmas.  GD and D-in-law will come at Christmas also.  Both sons really helpful. Christina Escobar GD has baby girl premature and doing better.  Youngest son's D is coming to [facility] soon.  Coming from [place].  Past Psychiatric Medication Trials:  Mirtazapine, nefazodone SE, Lexapro 10 Ativan, Ambien  Review of Systems:  Review of Systems  Constitutional: Positive for fatigue.  Cardiovascular: Negative for palpitations.  Gastrointestinal: Positive for abdominal pain. Negative for nausea.       Frequent BM  Musculoskeletal: Positive for gait problem.  Neurological: Positive for tremors and weakness.  Psychiatric/Behavioral: Positive for depression and sleep disturbance. Negative for agitation, behavioral problems, confusion, decreased concentration, dysphoric mood, hallucinations, self-injury and suicidal ideas. The patient is nervous/anxious. The patient is not hyperactive.   GI px managed.  Medications: I have reviewed the patient's current medications.  Current Outpatient Medications  Medication Sig Dispense Refill  . acetaminophen (TYLENOL) 325 MG tablet Take 325-650 mg by mouth every 8 (eight) hours as needed for mild pain or headache.     Marland Kitchen amLODipine (NORVASC) 5 MG tablet Take 1 tablet (5 mg total) by mouth daily. 30 tablet 0  . aspirin EC 81 MG  EC tablet Take 1 tablet (81 mg total) by mouth daily. 30 tablet 0  . atorvastatin (LIPITOR) 40 MG tablet Take 1 tablet (40 mg total) by mouth daily at 6 PM. 30 tablet 0  . Calcium Carbonate-Vitamin D3 (CALCIUM 600-D) 600-400 MG-UNIT TABS Take 1 tablet by mouth daily.     . clopidogrel (PLAVIX) 75 MG tablet Take 1 tablet (75 mg total) by mouth daily. 21 tablet 0  . escitalopram (LEXAPRO) 10 MG tablet Take 10 mg by mouth daily.    . fluticasone (FLONASE) 50 MCG/ACT nasal spray Place 1 spray into both nostrils at bedtime.    Marland Kitchen loratadine (CLARITIN) 10 MG tablet Take 10 mg by mouth daily as needed for allergies or rhinitis.     Marland Kitchen LORazepam (ATIVAN) 0.5 MG tablet Take 1 tablet (0.5 mg total) by mouth every evening. 30 tablet 5  . LORazepam (ATIVAN) 1 MG tablet TAKE 1 TABLET BY MOUTH TWICE DAILY AT 8AM AND 10PM 60 tablet 5  . Methylcellulose, Laxative, (CITRUCEL PO) Take by mouth See admin instructions. Mix 4.5 heaping teaspoonsful of powder into 8 ounces of water and drink once a day at bedtime    . mirtazapine (REMERON) 45 MG tablet Take 1 tablet (45 mg total) by mouth at bedtime. 90 tablet 1  . Multiple Vitamin (MULTIVITAMIN WITH MINERALS) TABS tablet Take 1 tablet by mouth daily.    . mupirocin ointment (BACTROBAN) 2 % Apply 1 application topically See admin instructions. Apply to the right arm 2 times a day    . phenylephrine-shark liver oil-mineral oil-petrolatum (PREPARATION H) 0.25-14-74.9 % rectal ointment Place 1 application rectally 2 (two) times daily as needed for hemorrhoids.    . quinapril (ACCUPRIL) 40 MG tablet Take 40 mg by mouth in the morning.     . simethicone (MYLICON) 397 MG chewable tablet Chew 125 mg by mouth every 6 (six) hours as needed for flatulence.    . SUMAtriptan (IMITREX) 25 MG tablet Take 25 mg by mouth once as needed for migraine.     Marland Kitchen zolpidem (AMBIEN) 5 MG tablet Take 1 tablet (5 mg total) by mouth at bedtime as needed. 30 tablet 3   No current  facility-administered medications for this visit.    Medication Side Effects: Other: ? sedation related.  Allergies:  Allergies  Allergen Reactions  . Adhesive [Tape] Other (See Comments)    Caused SEVERE skin irritation  . Cefdinir Other (See Comments)    Caused C-Diff  . Amitiza [Lubiprostone] Diarrhea  . Erythromycin Nausea And Vomiting  . Flagyl [Metronidazole] Diarrhea  . Meloxicam Other (See Comments)    Dizziness resulted after taking it     Past Medical History:  Diagnosis Date  . Allergy   . Anal fissure   . Anxiety   . Basal  cell carcinoma   . Cataracts, bilateral   . Clostridium difficile diarrhea   . Depression   . Diverticulosis    hx of stricture in colon  . Hemorrhoids   . Hypertension   . IBS (irritable bowel syndrome)   . Insomnia   . Migraine   . Osteopenia   . Thrombosed hemorrhoids     Family History  Problem Relation Age of Onset  . Alzheimer's disease Mother   . Heart failure Mother   . Heart disease Father   . Heart failure Father   . Other Maternal Grandfather        cerebral hemorrhage  . Skin cancer Brother   . Heart disease Brother   . Colon cancer Neg Hx   . Stomach cancer Neg Hx   . Pancreatic cancer Neg Hx     Social History   Socioeconomic History  . Marital status: Married    Spouse name: Paediatric nurse  . Number of children: 2  . Years of education: MA  . Highest education level: Not on file  Occupational History  . Occupation: retired Pharmacist, hospital  Tobacco Use  . Smoking status: Never Smoker  . Smokeless tobacco: Never Used  Vaping Use  . Vaping Use: Never used  Substance and Sexual Activity  . Alcohol use: Yes    Alcohol/week: 0.0 standard drinks    Comment: occasional glass of wine -2 per week  . Drug use: No  . Sexual activity: Not on file  Other Topics Concern  . Not on file  Social History Narrative   Pt lives at home with her Spouse. She has 3 children, (1 adopted). Spouse is a retired Film/video editor.   Caffeine  Use: 1-2 cups daily.   Social Determinants of Health   Financial Resource Strain:   . Difficulty of Paying Living Expenses: Not on file  Food Insecurity:   . Worried About Charity fundraiser in the Last Year: Not on file  . Ran Out of Food in the Last Year: Not on file  Transportation Needs:   . Lack of Transportation (Medical): Not on file  . Lack of Transportation (Non-Medical): Not on file  Physical Activity:   . Days of Exercise per Week: Not on file  . Minutes of Exercise per Session: Not on file  Stress:   . Feeling of Stress : Not on file  Social Connections:   . Frequency of Communication with Friends and Family: Not on file  . Frequency of Social Gatherings with Friends and Family: Not on file  . Attends Religious Services: Not on file  . Active Member of Clubs or Organizations: Not on file  . Attends Archivist Meetings: Not on file  . Marital Status: Not on file  Intimate Partner Violence:   . Fear of Current or Ex-Partner: Not on file  . Emotionally Abused: Not on file  . Physically Abused: Not on file  . Sexually Abused: Not on file    Past Medical History, Surgical history, Social history, and Family history were reviewed and updated as appropriate.   Please see review of systems for further details on the patient's review from today.   Objective:   Physical Exam:  There were no vitals taken for this visit.  Physical Exam Constitutional:      Appearance: Normal appearance.  Neurological:     Mental Status: She is alert.     Motor: Weakness present. No tremor.     Gait: Gait abnormal.  Comments: Using walker  Psychiatric:        Attention and Perception: Attention and perception normal.        Mood and Affect: Mood is anxious. Mood is not depressed.        Speech: Speech normal. Speech is not slurred.        Behavior: Behavior is cooperative.        Thought Content: Thought content is not delusional. Thought content does not include  homicidal or suicidal ideation.        Cognition and Memory: Cognition normal.     Comments: Insight and judgment good. Chronic anxiety overall worse with recent forced move.     Lab Review:     Component Value Date/Time   NA 138 12/20/2019 1140   K 4.4 12/20/2019 1140   CL 102 12/20/2019 1140   CO2 26 12/20/2019 1140   GLUCOSE 106 (H) 12/20/2019 1140   BUN 12 12/20/2019 1140   CREATININE 0.96 12/20/2019 1140   CALCIUM 9.9 12/20/2019 1140   PROT 7.0 12/20/2019 1140   ALBUMIN 4.0 12/20/2019 1140   AST 32 12/20/2019 1140   ALT 16 12/20/2019 1140   ALKPHOS 71 12/20/2019 1140   BILITOT 0.7 12/20/2019 1140   GFRNONAA 53 (L) 12/20/2019 1140   GFRAA >60 12/20/2019 1140       Component Value Date/Time   WBC 5.7 12/20/2019 1140   RBC 4.11 12/20/2019 1140   HGB 12.6 12/20/2019 1140   HCT 38.6 12/20/2019 1140   PLT 244 12/20/2019 1140   MCV 93.9 12/20/2019 1140   MCH 30.7 12/20/2019 1140   MCHC 32.6 12/20/2019 1140   RDW 11.5 12/20/2019 1140   LYMPHSABS 2.2 12/20/2019 1140   MONOABS 0.4 12/20/2019 1140   EOSABS 0.1 12/20/2019 1140   BASOSABS 0.1 12/20/2019 1140    No results found for: POCLITH, LITHIUM   No results found for: PHENYTOIN, PHENOBARB, VALPROATE, CBMZ   .res Assessment: Plan:    Layal was seen today for follow-up, anxiety and stress.  Diagnoses and all orders for this visit:  Generalized anxiety disorder -     LORazepam (ATIVAN) 1 MG tablet; TAKE 1 TABLET BY MOUTH TWICE DAILY AT 8AM AND 10PM -     LORazepam (ATIVAN) 0.5 MG tablet; Take 1 tablet (0.5 mg total) by mouth every evening. -     mirtazapine (REMERON) 45 MG tablet; Take 1 tablet (45 mg total) by mouth at bedtime.  Insomnia due to mental condition   Greater than 50% of 30 min face to face time with patient was spent on counseling and coordination of care. We discussed Ms. Issa has a long history of anxiety which in the past had contributed to unmanageable diarrhea another bowel symptoms  including bowel pain.  These symptoms were much improved with a combination of lorazepam and mirtazapine.  She has some chronic insomnia which is generally manageable with the medication.   We had been able to reduce the lorazepam some over the last couple of years and she is probably at the lowest tolerated dose and lowest effective dose.  Anxiety is worse lately.  Agree with trial of Lexapro 10 mg daily increased on 07/29/20.  It may provide more anxiety benefit in the next month. So far IBS is better with the increase  Do not reduce lorazepam further She takes lorazepam 1 mg at 7A and HS takes 0.5 mg  at 5 PM.  Sleep hygiene disc in detail and esp in relation to  napping.  We discussed the short-term risks associated with benzodiazepines including sedation and increased fall risk among others.  Discussed long-term side effect risk including dependence, potential withdrawal symptoms, and the potential eventual dose-related risk of dementia.  But recent studies from 2020 dispute this association between benzodiazepines and dementia risk. Newer studies in 2020 do not support an association with dementia.  Use Ambien prn.  Disc risk amnesia.    She needs the meds   Supportive and cognitive therapy techniques around age-related declines that both she and loss of husband and big stress of recent move.   This appt was 30 mins.  FU 4 mos  Lynder Parents, MD, DFAPA   Please see After Visit Summary for patient specific instructions.  No future appointments.  No orders of the defined types were placed in this encounter.     -------------------------------

## 2020-11-18 ENCOUNTER — Encounter: Payer: Self-pay | Admitting: Psychiatry

## 2020-11-18 ENCOUNTER — Other Ambulatory Visit: Payer: Self-pay

## 2020-11-18 ENCOUNTER — Ambulatory Visit (INDEPENDENT_AMBULATORY_CARE_PROVIDER_SITE_OTHER): Payer: Medicare Other | Admitting: Psychiatry

## 2020-11-18 DIAGNOSIS — F411 Generalized anxiety disorder: Secondary | ICD-10-CM | POA: Diagnosis not present

## 2020-11-18 DIAGNOSIS — F4321 Adjustment disorder with depressed mood: Secondary | ICD-10-CM

## 2020-11-18 DIAGNOSIS — F5105 Insomnia due to other mental disorder: Secondary | ICD-10-CM | POA: Diagnosis not present

## 2020-11-18 MED ORDER — LORAZEPAM 0.5 MG PO TABS: 0.5000 mg | ORAL_TABLET | Freq: Every evening | ORAL | 5 refills | Status: DC

## 2020-11-18 MED ORDER — ESCITALOPRAM OXALATE 10 MG PO TABS: 10.0000 mg | ORAL_TABLET | Freq: Every day | ORAL | 1 refills | Status: DC

## 2020-11-18 MED ORDER — LORAZEPAM 1 MG PO TABS: ORAL_TABLET | ORAL | 5 refills | Status: DC

## 2020-11-18 MED ORDER — MIRTAZAPINE 45 MG PO TABS: 45.0000 mg | ORAL_TABLET | Freq: Every day | ORAL | 1 refills | Status: DC

## 2020-11-18 NOTE — Progress Notes (Signed)
Christina Escobar [id number] [birth year] 87 y.o.  Subjective:   Patient ID:  Christina Escobar is a 87 y.o. (DOB [birth year]) female.  Chief Complaint:  Chief Complaint  Patient presents with  . Follow-up  . Generalized anxiety disorder  . Anxiety    Anxiety Symptoms include nervous/anxious behavior. Patient reports no confusion, decreased concentration, nausea, palpitations or suicidal ideas.    Depression        Associated symptoms include fatigue.  Associated symptoms include no decreased concentration and no suicidal ideas.  Past medical history includes anxiety.    Christina Escobar presents to the office today for follow-up of chronic anxiety.    Seen December 2020.  No meds were changed.  QT DT Covid.  Big sadness that Christina Escobar moving to [place].     Ambien used rarely. Not used #30 since Feb.  02/13/20 appt the following noted: Stroke 12/17/19 and hosp 4 days.  Then went to [facility] and upset she was given the wrong meds.  They reduced lorazepam from baseline to 0.5 mg BID and pt had NV from it and felt anxious.  These sx resolved with the return to lorazepam to 1 mg AM and 8 PM and 0.5 mg at 5 PM.    Upset she saw mice in the room.  After about a week had a fall bc of the mouse.   I can't get over it RE: poor health care from [facility].   Has since resumed normal dose of lorazepam. Still on mirtazapine 45 mg nightly. Hard time with decisions.  Not confident.  Hard to enjoy normal things.  Low energy. Fears ever having to go back to [employer].  Traumatized by the experience.  Always tense. Plan: continue She takes lorazepam 1 mg at 7A and HS takes 0.5 mg  at 5 PM.  Add Lexapro 5 for anxiety  04/15/20 appt with the following noted: Lexapro has been a big help.  I think I it's helped.  Sleep better and handled H's death better than usual and less IBS.  Handles IBS better. H died 04/08/2020. He was ready to die.  That helped me a lot.  He was 85  yo.  Had a lot of family support for awhile but family gone. If misses sleep then next night takes Ambien.  1 son in law locally and that's it. Lost the dog too bc can't care for it. Moving to assisted living at [facility].  08/19/20 appt with following noted; Got real upset with move to apt.  I just can't accept it.  Asked to have Lexapro increased by PCP to 10 mg daily about 3 weeks ago.  It's really helped the IBS better than any med so far.  Really upset with various things and has done fine with previous moves.  Things damaged and lost. Family says she should just let it go but can't. Wonders about getting a PCP with geriatric experience.   Son will visit at Christmas.  Christina and D-in-law will come at Christmas also.  Both sons really helpful. Christina Escobar Christina has baby girl premature and doing better. Plan continue Lexapro but increase to 10 mg daily.  11/18/2020 appointment with the following noted: Increased Lexapro 10 daily and has been doing well with it.    It's helped the diarrhea tremendously more than other GI treatments.  So I love it. No SE. Frustrated dealing with urinary incontinence. Had been dealing with so many loses since  here and at the last appt. Been busy taking care of evertything.  B died in 26-Sep-2023 in [place].  One of kids took her there.   Upset PCP resigned a couple of weeks ago.  Getting a new one. Worry over not getting comprehensive care.    Questions about her BP Christina Christina Escobar is so happy with her baby girl.    [place].  Youngest son's D is coming to [facility].  Coming from [place].  Past Psychiatric Medication Trials:  Mirtazapine, nefazodone SE, Lexapro 10 Ativan, Ambien  Review of Systems:  Review of Systems  Constitutional: Positive for fatigue.  Cardiovascular: Negative for palpitations.  Gastrointestinal: Negative for abdominal pain, diarrhea and nausea.       Frequent BM  Musculoskeletal: Positive for gait problem.  Neurological: Positive for tremors and  weakness.  Psychiatric/Behavioral: Positive for depression and sleep disturbance. Negative for agitation, behavioral problems, confusion, decreased concentration, dysphoric mood, hallucinations, self-injury and suicidal ideas. The patient is nervous/anxious. The patient is not hyperactive.   GI px managed.  Medications: I have reviewed the patient's current medications.  Current Outpatient Medications  Medication Sig Dispense Refill  . acetaminophen (TYLENOL) 325 MG tablet Take 325-650 mg by mouth every 8 (eight) hours as needed for mild pain or headache.     Marland Kitchen amLODipine (NORVASC) 5 MG tablet Take 1 tablet (5 mg total) by mouth daily. 30 tablet 0  . aspirin EC 81 MG EC tablet Take 1 tablet (81 mg total) by mouth daily. 30 tablet 0  . atorvastatin (LIPITOR) 40 MG tablet Take 1 tablet (40 mg total) by mouth daily at 6 PM. 30 tablet 0  . Calcium Carbonate-Vitamin D3 600-400 MG-UNIT TABS Take 1 tablet by mouth daily.     Marland Kitchen loratadine (CLARITIN) 10 MG tablet Take 10 mg by mouth daily as needed for allergies or rhinitis.     . Methylcellulose, Laxative, (CITRUCEL PO) Take by mouth See admin instructions. Mix 4.5 heaping teaspoonsful of powder into 8 ounces of water and drink once a day at bedtime    . Multiple Vitamin (MULTIVITAMIN WITH MINERALS) TABS tablet Take 1 tablet by mouth daily.    . phenylephrine-shark liver oil-mineral oil-petrolatum (PREPARATION H) 0.25-14-74.9 % rectal ointment Place 1 application rectally 2 (two) times daily as needed for hemorrhoids.    . quinapril (ACCUPRIL) 40 MG tablet Take 40 mg by mouth in the morning.     . simethicone (MYLICON) 371 MG chewable tablet Chew 125 mg by mouth every 6 (six) hours as needed for flatulence.    Marland Kitchen zolpidem (AMBIEN) 5 MG tablet Take 1 tablet (5 mg total) by mouth at bedtime as needed. 30 tablet 3  . clopidogrel (PLAVIX) 75 MG tablet Take 1 tablet (75 mg total) by mouth daily. (Patient not taking: Reported on 11/18/2020) 21 tablet 0  .  escitalopram (LEXAPRO) 10 MG tablet Take 1 tablet (10 mg total) by mouth daily. 90 tablet 1  . fluticasone (FLONASE) 50 MCG/ACT nasal spray Place 1 spray into both nostrils at bedtime. (Patient not taking: Reported on 11/18/2020)    . LORazepam (ATIVAN) 0.5 MG tablet Take 1 tablet (0.5 mg total) by mouth every evening. 30 tablet 5  . LORazepam (ATIVAN) 1 MG tablet TAKE 1 TABLET BY MOUTH TWICE DAILY AT 8AM AND 10PM 60 tablet 5  . mirtazapine (REMERON) 45 MG tablet Take 1 tablet (45 mg total) by mouth at bedtime. 90 tablet 1  . mupirocin ointment (BACTROBAN) 2 % Apply  1 application topically See admin instructions. Apply to the right arm 2 times a day (Patient not taking: Reported on 11/18/2020)    . SUMAtriptan (IMITREX) 25 MG tablet Take 25 mg by mouth once as needed for migraine.  (Patient not taking: Reported on 11/18/2020)     No current facility-administered medications for this visit.    Medication Side Effects: Other: ? sedation related.  Allergies:  Allergies  Allergen Reactions  . Adhesive [Tape] Other (See Comments)    Caused SEVERE skin irritation  . Cefdinir Other (See Comments)    Caused C-Diff  . Amitiza [Lubiprostone] Diarrhea  . Erythromycin Nausea And Vomiting  . Flagyl [Metronidazole] Diarrhea  . Meloxicam Other (See Comments)    Dizziness resulted after taking it     Past Medical History:  Diagnosis Date  . Allergy   . Anal fissure   . Anxiety   . Basal cell carcinoma   . Cataracts, bilateral   . Clostridium difficile diarrhea   . Depression   . Diverticulosis    hx of stricture in colon  . Hemorrhoids   . Hypertension   . IBS (irritable bowel syndrome)   . Insomnia   . Migraine   . Osteopenia   . Thrombosed hemorrhoids     Family History  Problem Relation Age of Onset  . Alzheimer's disease Mother   . Heart failure Mother   . Heart disease Father   . Heart failure Father   . Other Maternal Grandfather        cerebral hemorrhage  . Skin cancer  Brother   . Heart disease Brother   . Colon cancer Neg Hx   . Stomach cancer Neg Hx   . Pancreatic cancer Neg Hx     Social History   Socioeconomic History  . Marital status: Married    Spouse name: Paediatric nurse  . Number of children: 2  . Years of education: MA  . Highest education level: Not on file  Occupational History  . Occupation: retired Pharmacist, hospital  Tobacco Use  . Smoking status: Never Smoker  . Smokeless tobacco: Never Used  Vaping Use  . Vaping Use: Never used  Substance and Sexual Activity  . Alcohol use: Yes    Alcohol/week: 0.0 standard drinks    Comment: occasional glass of wine -2 per week  . Drug use: No  . Sexual activity: Not on file  Other Topics Concern  . Not on file  Social History Narrative   Pt lives at home with her Spouse. She has 3 children, (1 adopted). Spouse is a retired Film/video editor.   Caffeine Use: 1-2 cups daily.   Social Determinants of Health   Financial Resource Strain: Not on file  Food Insecurity: Not on file  Transportation Needs: Not on file  Physical Activity: Not on file  Stress: Not on file  Social Connections: Not on file  Intimate Partner Violence: Not on file    Past Medical History, Surgical history, Social history, and Family history were reviewed and updated as appropriate.   Please see review of systems for further details on the patient's review from today.   Objective:   Physical Exam:  There were no vitals taken for this visit.  Physical Exam Constitutional:      Appearance: Normal appearance.  Neurological:     Mental Status: She is alert.     Motor: Weakness present. No tremor.     Gait: Gait abnormal.     Comments: Using walker  Psychiatric:        Attention and Perception: Attention and perception normal.        Mood and Affect: Mood is anxious. Mood is not depressed.        Speech: Speech normal. Speech is not slurred.        Behavior: Behavior is cooperative.        Thought Content: Thought content is  not delusional. Thought content does not include homicidal or suicidal ideation.        Cognition and Memory: Cognition normal.     Comments: Insight and judgment good. Chronic anxiety overall better since here.     Lab Review:     Component Value Date/Time   NA 138 12/20/2019 1140   K 4.4 12/20/2019 1140   CL 102 12/20/2019 1140   CO2 26 12/20/2019 1140   GLUCOSE 106 (H) 12/20/2019 1140   BUN 12 12/20/2019 1140   CREATININE 0.96 12/20/2019 1140   CALCIUM 9.9 12/20/2019 1140   PROT 7.0 12/20/2019 1140   ALBUMIN 4.0 12/20/2019 1140   AST 32 12/20/2019 1140   ALT 16 12/20/2019 1140   ALKPHOS 71 12/20/2019 1140   BILITOT 0.7 12/20/2019 1140   GFRNONAA 53 (L) 12/20/2019 1140   GFRAA >60 12/20/2019 1140       Component Value Date/Time   WBC 5.7 12/20/2019 1140   RBC 4.11 12/20/2019 1140   HGB 12.6 12/20/2019 1140   HCT 38.6 12/20/2019 1140   PLT 244 12/20/2019 1140   MCV 93.9 12/20/2019 1140   MCH 30.7 12/20/2019 1140   MCHC 32.6 12/20/2019 1140   RDW 11.5 12/20/2019 1140   LYMPHSABS 2.2 12/20/2019 1140   MONOABS 0.4 12/20/2019 1140   EOSABS 0.1 12/20/2019 1140   BASOSABS 0.1 12/20/2019 1140    No results found for: POCLITH, LITHIUM   No results found for: PHENYTOIN, PHENOBARB, VALPROATE, CBMZ   .res Assessment: Plan:    Waver was seen today for follow-up, generalized anxiety disorder and anxiety.  Diagnoses and all orders for this visit:  Generalized anxiety disorder -     escitalopram (LEXAPRO) 10 MG tablet; Take 1 tablet (10 mg total) by mouth daily. -     mirtazapine (REMERON) 45 MG tablet; Take 1 tablet (45 mg total) by mouth at bedtime. -     LORazepam (ATIVAN) 1 MG tablet; TAKE 1 TABLET BY MOUTH TWICE DAILY AT 8AM AND 10PM -     LORazepam (ATIVAN) 0.5 MG tablet; Take 1 tablet (0.5 mg total) by mouth every evening.  Insomnia due to mental condition  Grief   Greater than 50% of 30 min face to face time with patient was spent on counseling and  coordination of care. We discussed Ms. Meader has a long history of anxiety which in the past had contributed to unmanageable diarrhea another bowel symptoms including bowel pain.  These symptoms were much improved with a combination of lorazepam and mirtazapine.  She has some chronic insomnia which is generally manageable with the medication.   We had been able to reduce the lorazepam some over the last couple of years and she is probably at the lowest tolerated dose and lowest effective dose.  Anxiety is better than it was.  Answered questions about blood pressure  Better after Lexapro 10 mg daily increased on 07/29/20.   IBS diarrhea resolved with increase.    Do not reduce lorazepam further She takes lorazepam 1 mg at 7A and HS takes 0.5 mg  at  5 PM.  Sleep hygiene disc in detail and esp in relation to napping.  We discussed the short-term risks associated with benzodiazepines including sedation and increased fall risk among others.  Discussed long-term side effect risk including dependence, potential withdrawal symptoms, and the potential eventual dose-related risk of dementia.  But recent studies from 2020 dispute this association between benzodiazepines and dementia risk. Newer studies in 2020 do not support an association with dementia.  Use Ambien prn.  Disc risk amnesia.    She needs the meds.  No med changes indicated.    Supportive and cognitive therapy techniques around age-related declines that both she and loss of husband and big stress of recent move. Started grief counseling today but not sure about it.  It's a group.   This appt was 30 mins.  FU 4-6 control everybody restricted mos  Lynder Parents, MD, DFAPA   Please see After Visit Summary for patient specific instructions.  No future appointments.  No orders of the defined types were placed in this encounter.     -------------------------------

## 2020-12-17 ENCOUNTER — Other Ambulatory Visit: Payer: Self-pay | Admitting: Psychiatry

## 2020-12-17 DIAGNOSIS — F5105 Insomnia due to other mental disorder: Secondary | ICD-10-CM

## 2020-12-17 NOTE — Telephone Encounter (Signed)
Controlled substance 

## 2021-03-24 ENCOUNTER — Other Ambulatory Visit: Payer: Self-pay

## 2021-03-24 ENCOUNTER — Ambulatory Visit: Payer: Medicare Other | Admitting: Psychiatry

## 2021-03-24 ENCOUNTER — Encounter: Payer: Self-pay | Admitting: Psychiatry

## 2021-03-24 DIAGNOSIS — F411 Generalized anxiety disorder: Secondary | ICD-10-CM

## 2021-03-24 DIAGNOSIS — F5105 Insomnia due to other mental disorder: Secondary | ICD-10-CM

## 2021-03-24 DIAGNOSIS — F4321 Adjustment disorder with depressed mood: Secondary | ICD-10-CM | POA: Diagnosis not present

## 2021-03-24 NOTE — Progress Notes (Signed)
Christina Escobar [id number] May 30, 1930 87 y.o.  Subjective:   Patient ID:  Christina Escobar is a 87 y.o. (DOB Oct 05, 1929) female.  Chief Complaint:  Chief Complaint  Patient presents with   Follow-up   Anxiety    Anxiety Symptoms include nervous/anxious behavior. Patient reports no confusion, decreased concentration, nausea, palpitations or suicidal ideas.    Depression        Associated symptoms include fatigue.  Associated symptoms include no decreased concentration and no suicidal ideas.  Past medical history includes anxiety.   Christina Escobar presents to the office today for follow-up of chronic anxiety.    Seen December 2020.  No meds were changed.  QT DT Covid.  Big sadness that Christina Escobar moving to [place].     Ambien used rarely. Not used #30 since Feb.  02/13/20 appt the following noted: Stroke 12/17/19 and hosp 4 days.  Then went to [facility] and upset she was given the wrong meds.  They reduced lorazepam from baseline to 0.5 mg BID and pt had NV from it and felt anxious.  These sx resolved with the return to lorazepam to 1 mg AM and 8 PM and 0.5 mg at 5 PM.    Upset she saw mice in the room.  After about a week had a fall bc of the mouse.   I can't get over it RE: poor health care from [facility].   Has since resumed normal dose of lorazepam. Still on mirtazapine 45 mg nightly. Hard time with decisions.  Not confident.  Hard to enjoy normal things.  Low energy. Fears ever having to go back to [employer].  Traumatized by the experience.  Always tense. Plan: continue She takes lorazepam 1 mg at 7A and HS takes 0.5 mg  at 5 PM.  Add Lexapro 5 for anxiety  04/15/20 appt with the following noted: Lexapro has been a big help.  I think I it's helped.  Sleep better and handled H's death better than usual and less IBS.  Handles IBS better. H died 04-17-2020. He was ready to die.  That helped me a lot.  He was 85 yo.  Had a lot of family support for  awhile but family gone. If misses sleep then next night takes Ambien.  1 son in law locally and that's it. Lost the dog too bc can't care for it. Moving to assisted living at [facility].  08/19/20 appt with following noted; Got real upset with move to apt.  I just can't accept it.  Asked to have Lexapro increased by PCP to 10 mg daily about 3 weeks ago.  It's really helped the IBS better than any med so far.  Really upset with various things and has done fine with previous moves.  Things damaged and lost. Family says she should just let it go but can't. Wonders about getting a PCP with geriatric experience.   Son will visit at Christmas.  Christina and D-in-law will come at Christmas also.  Both sons really helpful. Christina Escobar has baby girl premature and doing better. Plan continue Lexapro but increase to 10 mg daily.  11/18/2020 appointment with the following noted: Increased Lexapro 10 daily and has been doing well with it.    It's helped the diarrhea tremendously more than other GI treatments.  So I love it. No SE. Frustrated dealing with urinary incontinence. Had been dealing with so many loses since here and at the last appt.  Been busy taking care of evertything.  B died in 2023/09/13 in [place].  One of kids took her there.   Upset PCP resigned a couple of weeks ago.  Getting a new one. Worry over not getting comprehensive care.    Questions about her BP Christina Christina Escobar is so happy with her baby girl.    [place]. Plan: No med changes  03/24/2021 appointment with the following noted: Was doing alright.  Friday Wellness checkup. Tense and sad over events.  Son and D both sold homes and will move far away.  One in [place] and one in [place].  Christmas always been at their house and now they've planned to move away.  Middle son less available.  So sad about it. Making more friends where she lives.  Has 2. Youngest son's D came to [facility] from [employer] [place].  Son got better job in [place]. She finished  her program. She will continue college but move for the summer. Sad not depressed. Youngest son Christina Escobar is POA and moving to [place] with his inlaws. 6 mos in her apt.  Hard time for her. Some mornings awaken with anxiety.  Easy startle. Sleeping well bc worn out. Usually naps.  Past Psychiatric Medication Trials:  Mirtazapine, nefazodone SE, Lexapro 10 Ativan, Ambien  Review of Systems:  Review of Systems  Constitutional:  Positive for fatigue.  Cardiovascular:  Negative for palpitations.  Gastrointestinal:  Negative for abdominal pain, diarrhea and nausea.       Frequent BM  Musculoskeletal:  Positive for gait problem.  Neurological:  Positive for tremors and weakness.  Psychiatric/Behavioral:  Positive for sleep disturbance. Negative for agitation, behavioral problems, confusion, decreased concentration, dysphoric mood, hallucinations, self-injury and suicidal ideas. The patient is nervous/anxious. The patient is not hyperactive.  GI px managed.  Medications: I have reviewed the patient's current medications.  Current Outpatient Medications  Medication Sig Dispense Refill   acetaminophen (TYLENOL) 325 MG tablet Take 325-650 mg by mouth every 8 (eight) hours as needed for mild pain or headache.      amLODipine (NORVASC) 5 MG tablet Take 1 tablet (5 mg total) by mouth daily. 30 tablet 0   aspirin EC 81 MG EC tablet Take 1 tablet (81 mg total) by mouth daily. 30 tablet 0   atorvastatin (LIPITOR) 40 MG tablet Take 1 tablet (40 mg total) by mouth daily at 6 PM. 30 tablet 0   Calcium Carbonate-Vitamin D3 600-400 MG-UNIT TABS Take 1 tablet by mouth daily.      escitalopram (LEXAPRO) 10 MG tablet Take 1 tablet (10 mg total) by mouth daily. 90 tablet 1   loratadine (CLARITIN) 10 MG tablet Take 10 mg by mouth daily as needed for allergies or rhinitis.      LORazepam (ATIVAN) 0.5 MG tablet Take 1 tablet (0.5 mg total) by mouth every evening. 30 tablet 5   LORazepam (ATIVAN) 1 MG tablet TAKE 1  TABLET BY MOUTH TWICE DAILY AT 8AM AND 10PM 60 tablet 5   mirtazapine (REMERON) 45 MG tablet Take 1 tablet (45 mg total) by mouth at bedtime. 90 tablet 1   Multiple Vitamin (MULTIVITAMIN WITH MINERALS) TABS tablet Take 1 tablet by mouth daily.     phenylephrine-shark liver oil-mineral oil-petrolatum (PREPARATION H) 0.25-14-74.9 % rectal ointment Place 1 application rectally 2 (two) times daily as needed for hemorrhoids.     quinapril (ACCUPRIL) 40 MG tablet Take 40 mg by mouth in the morning.      simethicone (  MYLICON) 662 MG chewable tablet Chew 125 mg by mouth every 6 (six) hours as needed for flatulence.     zolpidem (AMBIEN) 5 MG tablet TAKE ONE TABLET BY MOUTH AT BEDTIME AS NEEDED 30 tablet 2   clopidogrel (PLAVIX) 75 MG tablet Take 1 tablet (75 mg total) by mouth daily. (Patient not taking: Reported on 03/24/2021) 21 tablet 0   fluticasone (FLONASE) 50 MCG/ACT nasal spray Place 1 spray into both nostrils at bedtime. (Patient not taking: No sig reported)     Methylcellulose, Laxative, (CITRUCEL PO) Take by mouth See admin instructions. Mix 4.5 heaping teaspoonsful of powder into 8 ounces of water and drink once a day at bedtime     mupirocin ointment (BACTROBAN) 2 % Apply 1 application topically See admin instructions. Apply to the right arm 2 times a day (Patient not taking: No sig reported)     SUMAtriptan (IMITREX) 25 MG tablet Take 25 mg by mouth once as needed for migraine.  (Patient not taking: No sig reported)     No current facility-administered medications for this visit.    Medication Side Effects: Other: ? sedation related.  Allergies:  Allergies  Allergen Reactions   Adhesive [Tape] Other (See Comments)    Caused SEVERE skin irritation   Cefdinir Other (See Comments)    Caused C-Diff   Amitiza [Lubiprostone] Diarrhea   Erythromycin Nausea And Vomiting   Flagyl [Metronidazole] Diarrhea   Meloxicam Other (See Comments)    Dizziness resulted after taking it     Past  Medical History:  Diagnosis Date   Allergy    Anal fissure    Anxiety    Basal cell carcinoma    Cataracts, bilateral    Clostridium difficile diarrhea    Depression    Diverticulosis    hx of stricture in colon   Hemorrhoids    Hypertension    IBS (irritable bowel syndrome)    Insomnia    Migraine    Osteopenia    Thrombosed hemorrhoids     Family History  Problem Relation Age of Onset   Alzheimer's disease Mother    Heart failure Mother    Heart disease Father    Heart failure Father    Other Maternal Grandfather        cerebral hemorrhage   Skin cancer Brother    Heart disease Brother    Colon cancer Neg Hx    Stomach cancer Neg Hx    Pancreatic cancer Neg Hx     Social History   Socioeconomic History   Marital status: Married    Spouse name: Christina Escobar   Number of children: 2   Years of education: MA   Highest education level: Not on file  Occupational History   Occupation: retired Pharmacist, hospital  Tobacco Use   Smoking status: Never   Smokeless tobacco: Never  Vaping Use   Vaping Use: Never used  Substance and Sexual Activity   Alcohol use: Yes    Alcohol/week: 0.0 standard drinks    Comment: occasional glass of wine -2 per week   Drug use: No   Sexual activity: Not on file  Other Topics Concern   Not on file  Social History Narrative   Pt lives at home with her Spouse. She has 3 children, (1 adopted). Spouse is a retired Film/video editor.   Caffeine Use: 1-2 cups daily.   Social Determinants of Health   Financial Resource Strain: Not on file  Food Insecurity: Not on file  Transportation  Needs: Not on file  Physical Activity: Not on file  Stress: Not on file  Social Connections: Not on file  Intimate Partner Violence: Not on file    Past Medical History, Surgical history, Social history, and Family history were reviewed and updated as appropriate.   Please see review of systems for further details on the patient's review from today.   Objective:    Physical Exam:  There were no vitals taken for this visit.  Physical Exam Constitutional:      Appearance: Normal appearance.  Neurological:     Mental Status: She is alert.     Motor: Weakness present. No tremor.     Gait: Gait abnormal.     Comments: Using walker  Psychiatric:        Attention and Perception: Attention and perception normal.        Mood and Affect: Mood is anxious. Mood is not depressed.        Speech: Speech normal. Speech is not slurred.        Behavior: Behavior is cooperative.        Thought Content: Thought content is not delusional. Thought content does not include homicidal or suicidal ideation.        Cognition and Memory: Cognition normal.     Comments: Insight and judgment good. Chronic anxiety manageable. Sad over family moving.    Lab Review:     Component Value Date/Time   NA 138 12/20/2019 1140   K 4.4 12/20/2019 1140   CL 102 12/20/2019 1140   CO2 26 12/20/2019 1140   GLUCOSE 106 (H) 12/20/2019 1140   BUN 12 12/20/2019 1140   CREATININE 0.96 12/20/2019 1140   CALCIUM 9.9 12/20/2019 1140   PROT 7.0 12/20/2019 1140   ALBUMIN 4.0 12/20/2019 1140   AST 32 12/20/2019 1140   ALT 16 12/20/2019 1140   ALKPHOS 71 12/20/2019 1140   BILITOT 0.7 12/20/2019 1140   GFRNONAA 53 (L) 12/20/2019 1140   GFRAA >60 12/20/2019 1140       Component Value Date/Time   WBC 5.7 12/20/2019 1140   RBC 4.11 12/20/2019 1140   HGB 12.6 12/20/2019 1140   HCT 38.6 12/20/2019 1140   PLT 244 12/20/2019 1140   MCV 93.9 12/20/2019 1140   MCH 30.7 12/20/2019 1140   MCHC 32.6 12/20/2019 1140   RDW 11.5 12/20/2019 1140   LYMPHSABS 2.2 12/20/2019 1140   MONOABS 0.4 12/20/2019 1140   EOSABS 0.1 12/20/2019 1140   BASOSABS 0.1 12/20/2019 1140    No results found for: POCLITH, LITHIUM   No results found for: PHENYTOIN, PHENOBARB, VALPROATE, CBMZ   .res Assessment: Plan:    Christina Escobar was seen today for follow-up and anxiety.  Diagnoses and all orders for this  visit:  Generalized anxiety disorder  Insomnia due to mental condition  Grief  Greater than 50% of 30 min face to face time with patient was spent on counseling and coordination of care. We discussed Ms. Rudnicki has a long history of anxiety which in the past had contributed to unmanageable diarrhea another bowel symptoms including bowel pain.  These symptoms were much improved with a combination of lorazepam and mirtazapine.  She has some chronic insomnia which is generally manageable with the medication.   We had been able to reduce the lorazepam some over the last couple of years and she is probably at the lowest tolerated dose and lowest effective dose.  Anxiety is chronic but not disabling.  Sad not  depressed.  Answered questions about blood pressure  Better after Lexapro 10 mg daily increased on 07/29/20.   IBS diarrhea resolved with increase.    Do not reduce lorazepam further She takes lorazepam 1 mg at 7A and HS takes 0.5 mg  at 5 PM.  Sleep hygiene disc in detail and esp in relation to napping.  We discussed the short-term risks associated with benzodiazepines including sedation and increased fall risk among others.  Discussed long-term side effect risk including dependence, potential withdrawal symptoms, and the potential eventual dose-related risk of dementia.  But recent studies from 2020 dispute this association between benzodiazepines and dementia risk. Newer studies in 2020 do not support an association with dementia.  Use Ambien prn.  Disc risk amnesia.    She needs the meds.  No med changes indicated.    Supportive and cognitive therapy techniques around age-related declines that both she and loss of husband and big stress of recent move. Started grief counseling today but not sure about it.  It's a group.   This appt was 30 mins.  FU 4-6 control everybody restricted mos  Lynder Parents, MD, DFAPA   Please see After Visit Summary for patient specific  instructions.  No future appointments.  No orders of the defined types were placed in this encounter.     -------------------------------

## 2021-05-22 ENCOUNTER — Other Ambulatory Visit: Payer: Self-pay | Admitting: Psychiatry

## 2021-05-22 DIAGNOSIS — F411 Generalized anxiety disorder: Secondary | ICD-10-CM

## 2021-06-06 ENCOUNTER — Other Ambulatory Visit: Payer: Self-pay | Admitting: Psychiatry

## 2021-06-06 DIAGNOSIS — F411 Generalized anxiety disorder: Secondary | ICD-10-CM

## 2021-07-21 ENCOUNTER — Ambulatory Visit (INDEPENDENT_AMBULATORY_CARE_PROVIDER_SITE_OTHER): Payer: Medicare Other | Admitting: Psychiatry

## 2021-07-21 ENCOUNTER — Other Ambulatory Visit: Payer: Self-pay

## 2021-07-21 ENCOUNTER — Encounter: Payer: Self-pay | Admitting: Psychiatry

## 2021-07-21 DIAGNOSIS — F411 Generalized anxiety disorder: Secondary | ICD-10-CM

## 2021-07-21 DIAGNOSIS — F5105 Insomnia due to other mental disorder: Secondary | ICD-10-CM | POA: Diagnosis not present

## 2021-07-21 MED ORDER — ESCITALOPRAM OXALATE 10 MG PO TABS: 10.0000 mg | ORAL_TABLET | Freq: Every day | ORAL | 2 refills | Status: DC

## 2021-07-21 NOTE — Progress Notes (Signed)
Christina Escobar [id number] 1930/05/14 87 y.o.  Subjective:   Patient ID:  Christina Escobar is a 87 y.o. (DOB March 20, 1930) female.  Chief Complaint:  Chief Complaint  Patient presents with   Follow-up   Anxiety   Sleeping Problem    Anxiety Symptoms include nervous/anxious behavior. Patient reports no confusion, decreased concentration, nausea, palpitations or suicidal ideas.    Depression        Associated symptoms include fatigue.  Associated symptoms include no decreased concentration and no suicidal ideas.  Past medical history includes anxiety.   Christina Escobar presents to the office today for follow-up of chronic anxiety.    Seen December 2020.  No meds were changed.  QT DT Covid.  Big sadness that [name] moving to [place].     Ambien used rarely. Not used #30 since Feb.  02/13/20 appt the following noted: Stroke 12/17/19 and hosp 4 days.  Then went to [facility] and upset she was given the wrong meds.  They reduced lorazepam from baseline to 0.5 mg BID and pt had NV from it and felt anxious.  These sx resolved with the return to lorazepam to 1 mg AM and 8 PM and 0.5 mg at 5 PM.    Upset she saw mice in the room.  After about a week had a fall bc of the mouse.   I can't get over it RE: poor health care from [facility].   Has since resumed normal dose of lorazepam. Still on mirtazapine 45 mg nightly. Hard time with decisions.  Not confident.  Hard to enjoy normal things.  Low energy. Fears ever having to go back to [employer].  Traumatized by the experience.  Always tense. Plan: continue She takes lorazepam 1 mg at 7A and HS takes 0.5 mg  at 5 PM.  Add Lexapro 5 for anxiety  04/15/20 appt with the following noted: Lexapro has been a big help.  I think I it's helped.  Sleep better and handled H's death better than usual and less IBS.  Handles IBS better. H died April 25, 2020. He was ready to die.  That helped me a lot.  He was 85 yo.  Had a lot  of family support for awhile but family gone. If misses sleep then next night takes Ambien.  1 son in law locally and that's it. Lost the dog too bc can't care for it. Moving to assisted living at [facility].  08/19/20 appt with following noted; Got real upset with move to apt.  I just can't accept it.  Asked to have Lexapro increased by PCP to 10 mg daily about 3 weeks ago.  It's really helped the IBS better than any med so far.  Really upset with various things and has done fine with previous moves.  Things damaged and lost. Family says she should just let it go but can't. Wonders about getting a PCP with geriatric experience.   Son will visit at Christmas.  GD and D-in-law will come at Christmas also.  Both sons really helpful. Christina Escobar GD has baby girl premature and doing better. Plan continue Lexapro but increase to 10 mg daily.  11/18/2020 appointment with the following noted: Increased Lexapro 10 daily and has been doing well with it.    It's helped the diarrhea tremendously more than other GI treatments.  So I love it. No SE. Frustrated dealing with urinary incontinence. Had been dealing with so many loses since here and  at the last appt. Been busy taking care of evertything.  B died in September 26, 2023 in [place].  One of kids took her there.   Upset PCP resigned a couple of weeks ago.  Getting a new one. Worry over not getting comprehensive care.    Questions about her BP GD Christina Escobar is so happy with her baby girl.    [name]. Plan: No med changes  03/24/2021 appointment with the following noted: Was doing alright.  Friday Wellness checkup. Tense and sad over events.  Son and D both sold homes and will move far away.  One in [place] and one in [place].  Christmas always been at their house and now they've planned to move away.  Middle son less available.  So sad about it. Making more friends where she lives.  Has 2. Youngest son's D came to [name] from [employer] [place].  Son got better job in  [place]. She finished her program. She will continue college but move for the summer. Sad not depressed. Youngest son [name] is POA and moving to [place] with his inlaws. 6 mos in her apt.  Hard time for her. Some mornings awaken with anxiety.  Easy startle. Sleeping well bc worn out. Usually naps. Plan:Better after Lexapro 10 mg daily increased on 07/29/20.   IBS diarrhea resolved with increase.   Do not reduce lorazepam further She takes lorazepam 1 mg at 7A and HS takes 0.5 mg  at 5 PM.   07/21/21 appt noted: No migraine and doesn't mind diarrhea so no immodium. Hx CVA 12/17/19  and followed at [facility].  R leg weakness and using a walker.  Fatigue also. Got to see Christina Escobar and 48 yo baby. D came and said pt has OC sx.  Pt says she just wants to do things herself.  Pt prefers arrangment of things in her bathroom.  D thinks she worries too much. Still reads novels and tries to stay active.  Sometimes eats alone and doesn't mind it. Patient reports stable mood and denies depressed or irritable moods.  Patient denies difficulty with sleep initiation or maintenance. Denies appetite disturbance.  Patient reports that energy and motivation have been good.  Patient denies any difficulty with concentration.  Patient denies any suicidal ideation.   Past Psychiatric Medication Trials:  Mirtazapine, nefazodone SE, Lexapro 10 Ativan, Ambien  Review of Systems:  Review of Systems  Constitutional:  Positive for fatigue.  Cardiovascular:  Negative for palpitations.  Gastrointestinal:  Positive for diarrhea. Negative for abdominal pain and nausea.       Frequent BM  Musculoskeletal:  Positive for gait problem.  Neurological:  Positive for tremors and weakness.  Psychiatric/Behavioral:  Positive for sleep disturbance. Negative for agitation, behavioral problems, confusion, decreased concentration, dysphoric mood, hallucinations, self-injury and suicidal ideas. The patient is nervous/anxious. The patient  is not hyperactive.  GI px managed.  Medications: I have reviewed the patient's current medications.  Current Outpatient Medications  Medication Sig Dispense Refill   acetaminophen (TYLENOL) 325 MG tablet Take 325-650 mg by mouth every 8 (eight) hours as needed for mild pain or headache.      amLODipine (NORVASC) 5 MG tablet Take 1 tablet (5 mg total) by mouth daily. 30 tablet 0   aspirin EC 81 MG EC tablet Take 1 tablet (81 mg total) by mouth daily. 30 tablet 0   atorvastatin (LIPITOR) 40 MG tablet Take 1 tablet (40 mg total) by mouth daily at 6 PM. 30 tablet 0  Calcium Carbonate-Vitamin D3 600-400 MG-UNIT TABS Take 1 tablet by mouth daily.      clopidogrel (PLAVIX) 75 MG tablet Take 1 tablet (75 mg total) by mouth daily. 21 tablet 0   fluticasone (FLONASE) 50 MCG/ACT nasal spray Place 1 spray into both nostrils at bedtime.     loratadine (CLARITIN) 10 MG tablet Take 10 mg by mouth daily as needed for allergies or rhinitis.      LORazepam (ATIVAN) 0.5 MG tablet TAKE 1 TABLET BY MOUTH EVERY EVENING 30 tablet 5   LORazepam (ATIVAN) 1 MG tablet TAKE ONE (1) TABLET BY MOUTH TWO (2) TIMES DAILY AT 8AM AND 10PM 60 tablet 5   Methylcellulose, Laxative, (CITRUCEL PO) Take by mouth See admin instructions. Mix 4.5 heaping teaspoonsful of powder into 8 ounces of water and drink once a day at bedtime     mirtazapine (REMERON) 45 MG tablet Take 1 tablet (45 mg total) by mouth at bedtime. 90 tablet 1   Multiple Vitamin (MULTIVITAMIN WITH MINERALS) TABS tablet Take 1 tablet by mouth daily.     mupirocin ointment (BACTROBAN) 2 % Apply 1 application topically See admin instructions. Apply to the right arm 2 times a day     phenylephrine-shark liver oil-mineral oil-petrolatum (PREPARATION H) 0.25-14-74.9 % rectal ointment Place 1 application rectally 2 (two) times daily as needed for hemorrhoids.     quinapril (ACCUPRIL) 40 MG tablet Take 40 mg by mouth in the morning.      simethicone (MYLICON) 027 MG  chewable tablet Chew 125 mg by mouth every 6 (six) hours as needed for flatulence.     SUMAtriptan (IMITREX) 25 MG tablet Take 25 mg by mouth once as needed for migraine.     zolpidem (AMBIEN) 5 MG tablet TAKE ONE TABLET BY MOUTH AT BEDTIME AS NEEDED 30 tablet 2   escitalopram (LEXAPRO) 10 MG tablet Take 1 tablet (10 mg total) by mouth daily. 90 tablet 2   No current facility-administered medications for this visit.    Medication Side Effects: Other: ? sedation related.  Allergies:  Allergies  Allergen Reactions   Adhesive [Tape] Other (See Comments)    Caused SEVERE skin irritation   Cefdinir Other (See Comments)    Caused C-Diff   Amitiza [Lubiprostone] Diarrhea   Erythromycin Nausea And Vomiting   Flagyl [Metronidazole] Diarrhea   Meloxicam Other (See Comments)    Dizziness resulted after taking it     Past Medical History:  Diagnosis Date   Allergy    Anal fissure    Anxiety    Basal cell carcinoma    Cataracts, bilateral    Clostridium difficile diarrhea    Depression    Diverticulosis    hx of stricture in colon   Hemorrhoids    Hypertension    IBS (irritable bowel syndrome)    Insomnia    Migraine    Osteopenia    Thrombosed hemorrhoids     Family History  Problem Relation Age of Onset   Alzheimer's disease Mother    Heart failure Mother    Heart disease Father    Heart failure Father    Other Maternal Grandfather        cerebral hemorrhage   Skin cancer Brother    Heart disease Brother    Colon cancer Neg Hx    Stomach cancer Neg Hx    Pancreatic cancer Neg Hx     Social History   Socioeconomic History   Marital status: Married  Spouse name: [name]   Number of children: 2   Years of education: MA   Highest education level: Not on file  Occupational History   Occupation: retired Pharmacist, hospital  Tobacco Use   Smoking status: Never   Smokeless tobacco: Never  Vaping Use   Vaping Use: Never used  Substance and Sexual Activity   Alcohol use:  Yes    Alcohol/week: 0.0 standard drinks    Comment: occasional glass of wine -2 per week   Drug use: No   Sexual activity: Not on file  Other Topics Concern   Not on file  Social History Narrative   Pt lives at home with her Spouse. She has 3 children, (1 adopted). Spouse is a retired Film/video editor.   Caffeine Use: 1-2 cups daily.   Social Determinants of Health   Financial Resource Strain: Not on file  Food Insecurity: Not on file  Transportation Needs: Not on file  Physical Activity: Not on file  Stress: Not on file  Social Connections: Not on file  Intimate Partner Violence: Not on file    Past Medical History, Surgical history, Social history, and Family history were reviewed and updated as appropriate.   Please see review of systems for further details on the patient's review from today.   Objective:   Physical Exam:  There were no vitals taken for this visit.  Physical Exam Constitutional:      Appearance: Normal appearance.  Neurological:     Mental Status: She is alert.     Motor: Weakness present. No tremor.     Gait: Gait abnormal.     Comments: Using walker  Psychiatric:        Attention and Perception: Attention and perception normal.        Mood and Affect: Mood is anxious. Mood is not depressed.        Speech: Speech normal. Speech is not slurred.        Behavior: Behavior is not slowed. Behavior is cooperative.        Thought Content: Thought content is not delusional. Thought content does not include homicidal or suicidal ideation.        Cognition and Memory: Cognition normal.     Comments: Insight and judgment good. Chronic anxiety manageable.     Lab Review:     Component Value Date/Time   NA 138 12/20/2019 1140   K 4.4 12/20/2019 1140   CL 102 12/20/2019 1140   CO2 26 12/20/2019 1140   GLUCOSE 106 (H) 12/20/2019 1140   BUN 12 12/20/2019 1140   CREATININE 0.96 12/20/2019 1140   CALCIUM 9.9 12/20/2019 1140   PROT 7.0 12/20/2019 1140    ALBUMIN 4.0 12/20/2019 1140   AST 32 12/20/2019 1140   ALT 16 12/20/2019 1140   ALKPHOS 71 12/20/2019 1140   BILITOT 0.7 12/20/2019 1140   GFRNONAA 53 (L) 12/20/2019 1140   GFRAA >60 12/20/2019 1140       Component Value Date/Time   WBC 5.7 12/20/2019 1140   RBC 4.11 12/20/2019 1140   HGB 12.6 12/20/2019 1140   HCT 38.6 12/20/2019 1140   PLT 244 12/20/2019 1140   MCV 93.9 12/20/2019 1140   MCH 30.7 12/20/2019 1140   MCHC 32.6 12/20/2019 1140   RDW 11.5 12/20/2019 1140   LYMPHSABS 2.2 12/20/2019 1140   MONOABS 0.4 12/20/2019 1140   EOSABS 0.1 12/20/2019 1140   BASOSABS 0.1 12/20/2019 1140    No results found for: POCLITH, LITHIUM  No results found for: PHENYTOIN, PHENOBARB, VALPROATE, CBMZ   .res Assessment: Plan:    Laurren was seen today for follow-up, anxiety and sleeping problem.  Diagnoses and all orders for this visit:  Generalized anxiety disorder -     escitalopram (LEXAPRO) 10 MG tablet; Take 1 tablet (10 mg total) by mouth daily.  Insomnia due to mental condition  Greater than 50% of 30 min face to face time with patient was spent on counseling and coordination of care. We discussed Ms. Yeater has a long history of anxiety which in the past had contributed to unmanageable diarrhea another bowel symptoms including bowel pain.  These symptoms were much improved with a combination of lorazepam and mirtazapine.  She has some chronic insomnia which is generally manageable with the medication.   We had been able to reduce the lorazepam some over the last couple of years and she is probably at the lowest tolerated dose and lowest effective dose. Lexapro helped some.  Anxiety is chronic but not disabling.  She's satisfied with meds.  Pt does not describe dysphoric OC sx.  Sad not depressed.  Disc dealing with consequences of the stroke.  Disc relationship between IBS and diarrhea.  She feels it's manageable at this time.  Better after Lexapro 10 mg daily increased  on 07/29/20.   IBS diarrhea better with increase.    Do not reduce lorazepam further She takes lorazepam 1 mg at 7A and HS takes 0.5 mg  at 5 PM.  Sleep hygiene disc in detail and esp in relation to napping.  We discussed the short-term risks associated with benzodiazepines including sedation and increased fall risk among others.  Discussed long-term side effect risk including dependence, potential withdrawal symptoms, and the potential eventual dose-related risk of dementia.  But recent studies from 2020 dispute this association between benzodiazepines and dementia risk. Newer studies in 2020 do not support an association with dementia.  Use Ambien prn.  Disc risk amnesia.    She needs the meds.  No med changes indicated.    Supportive and cognitive therapy techniques around age-related declines that both she and loss of husband and big stress of recent move. Did some grief counseling   Answered questions about mild anemia 11.7 hgb in October 2022   This appt was 30 mins.  FU 4-6 control everybody restricted mos  Lynder Parents, MD, DFAPA   Please see After Visit Summary for patient specific instructions.  No future appointments.  No orders of the defined types were placed in this encounter.     -------------------------------

## 2021-07-29 ENCOUNTER — Other Ambulatory Visit: Payer: Self-pay | Admitting: Psychiatry

## 2021-07-29 DIAGNOSIS — F411 Generalized anxiety disorder: Secondary | ICD-10-CM

## 2021-10-08 ENCOUNTER — Telehealth: Payer: Self-pay | Admitting: Gastroenterology

## 2021-10-08 NOTE — Telephone Encounter (Signed)
Christina Escobar,   Inbound call from patient requesting to transfer care back to you for severe constipation and swollen legs. She last seen a GI provider at [facility] 03/2021. Records are in Epic. Could you please review and advise on scheduling?  Thank you

## 2021-10-09 NOTE — Telephone Encounter (Signed)
Thank you for responding so quickly.  The patient has been called and informed.

## 2021-10-09 NOTE — Telephone Encounter (Signed)
Patient called again this morning.  She wanted you to know that it is more than constipation but a backup of stool for which she has been taking Dulcolax but getting no relief.  She said this has been going on over a week and she is getting very concerned.  Thank you.

## 2021-10-22 ENCOUNTER — Other Ambulatory Visit: Payer: Self-pay | Admitting: Psychiatry

## 2021-10-22 DIAGNOSIS — F5105 Insomnia due to other mental disorder: Secondary | ICD-10-CM

## 2021-11-24 ENCOUNTER — Encounter: Payer: Self-pay | Admitting: Psychiatry

## 2021-11-24 ENCOUNTER — Other Ambulatory Visit: Payer: Self-pay

## 2021-11-24 ENCOUNTER — Ambulatory Visit (INDEPENDENT_AMBULATORY_CARE_PROVIDER_SITE_OTHER): Payer: Medicare Other | Admitting: Psychiatry

## 2021-11-24 ENCOUNTER — Other Ambulatory Visit: Payer: Self-pay | Admitting: Psychiatry

## 2021-11-24 DIAGNOSIS — F411 Generalized anxiety disorder: Secondary | ICD-10-CM | POA: Diagnosis not present

## 2021-11-24 DIAGNOSIS — F5105 Insomnia due to other mental disorder: Secondary | ICD-10-CM

## 2021-11-24 MED ORDER — ZOLPIDEM TARTRATE 5 MG PO TABS: 5.0000 mg | ORAL_TABLET | Freq: Every evening | ORAL | 5 refills | Status: AC | PRN

## 2021-11-24 MED ORDER — LORAZEPAM 1 MG PO TABS: ORAL_TABLET | ORAL | 5 refills | Status: DC

## 2021-11-24 MED ORDER — MIRTAZAPINE 45 MG PO TABS
45.0000 mg | ORAL_TABLET | Freq: Every day | ORAL | 1 refills | Status: DC
Start: 2021-11-24 — End: 2022-07-27

## 2021-11-24 MED ORDER — LORAZEPAM 0.5 MG PO TABS
0.5000 mg | ORAL_TABLET | Freq: Every evening | ORAL | 5 refills | Status: DC
Start: 2021-11-24 — End: 2022-10-13

## 2021-11-24 NOTE — Progress Notes (Signed)
Christina Escobar °[id number] °07/27/1930 °88 y.o. ° °Subjective:  ° °Patient ID:  Christina Escobar is a 88 y.o. (DOB 11/23/1929) female. ° °Chief Complaint:  °Chief Complaint  °Patient presents with  ° Follow-up  ° Anxiety  ° ° °Anxiety °Symptoms include nervous/anxious behavior. Patient reports no confusion, decreased concentration, palpitations or suicidal ideas.  ° ° °Depression °       Associated symptoms include fatigue.  Associated symptoms include no decreased concentration and no suicidal ideas.  Past medical history includes anxiety.   °Christina Escobar presents to the office today for follow-up of chronic anxiety.   ° °Seen December 2020.  No meds were changed. ° °QT DT Covid.  Big sadness that GD Christina Escobar moving to [place].   ° ° Ambien used rarely. Not used #30 since Feb. ° °02/13/20 appt the following noted: °Stroke 12/17/19 and hosp 4 days.  Then went to [facility] facility and upset she was given the wrong meds.  They reduced lorazepam from baseline to 0.5 mg BID and pt had NV from it and felt anxious.  These sx resolved with the return to lorazepam to 1 mg AM and 8 PM and 0.5 mg at 5 PM.  °  Upset she saw mice in the room.  After about a week had a fall bc of the mouse.   °I can't get over it RE: poor health care from [facility].   °Has since resumed normal dose of lorazepam. °Still on mirtazapine 45 mg nightly. °Hard time with decisions.  Not confident.  Hard to enjoy normal things.  Low energy. °Fears ever having to go back to [facility] nursing facility.  Traumatized by the experience.  °Always tense. °Plan: continue She takes lorazepam 1 mg at 7A and HS takes 0.5 mg  at 5 PM.  °Add Lexapro 5 for anxiety ° °04/15/20 appt with the following noted: °Lexapro has been a big help.  I think I it's helped.  Sleep better and handled H's death better than usual and less IBS.  Handles IBS better. °H died 04/03/20. He was ready to die.  That helped me a lot.  He was 86 yo.  Had a lot of family support for awhile  but family gone. °If misses sleep then next night takes Ambien.  °1 son in law locally and that's it. °Lost the dog too bc can't care for it. °Moving to assisted living at [facility]. ° °08/19/20 appt with following noted; °Got real upset with move to apt.  I just can't accept it.  Asked to have Lexapro increased by PCP to 10 mg daily about 3 weeks ago.  It's really helped the IBS better than any med so far.  Really upset with various things and has done fine with previous moves.  Things damaged and lost. °Family says she should just let it go but can't. °Wonders about getting a PCP with geriatric experience.   °Son will visit at Christmas.  GD and D-in-law will come at Christmas also.  Both sons really helpful. °Christina Escobar GD has baby girl premature and doing better. °Plan continue Lexapro but increase to 10 mg daily. ° °11/18/2020 appointment with the following noted: °Increased Lexapro 10 daily and has been doing well with it.    It's helped the diarrhea tremendously more than other GI treatments.  So I love it. °No SE. °Frustrated dealing with urinary incontinence. °Had been dealing with so many loses since here and at the last appt. Been   busy taking care of evertything.  B died in December in [place].  One of kids took her there.   °Upset PCP resigned a couple of weeks ago.  Getting a new one. °Worry over not getting comprehensive care.    °Questions about her BP °GD Christina Escobar is so happy with her baby girl.    °Misses [name]. °Plan: No med changes ° °03/24/2021 appointment with the following noted: °Was doing alright.  Friday Wellness checkup. °Tense and sad over events.  Son and D both sold homes and will move far away.  One in [place] and one in [place].  Christmas always been at their house and now they've planned to move away.  Middle son less available.  So sad about it. °Making more friends where she lives.  Has 2. °Youngest son's D came to [employer] from [place] Christina Escobar.  Son got better job in [place]. °She finished her  program. °She will continue college but move for the summer. °Sad not depressed. °Youngest son Christina Escobar is POA and moving to [place] with his inlaws. °6 mos in her apt.  Hard time for her. °Some mornings awaken with anxiety.  Easy startle. °Sleeping well bc worn out. Usually naps. °Plan:Better after Lexapro 10 mg daily increased on 07/29/20.   °IBS diarrhea resolved with increase.   °Do not reduce lorazepam further °She takes lorazepam 1 mg at 7A and HS takes 0.5 mg  at 5 PM.  ° °07/21/21 appt noted: °No migraine and doesn't mind diarrhea so no immodium. °Hx CVA 12/17/19  and followed at [facility].  R leg weakness and using a walker.  Fatigue also. °Got to see Christina Escobar and 1 yo baby. °D came and said pt has OC sx.  Pt says she just wants to do things herself.  Pt prefers arrangment of things in her bathroom.  D thinks she worries too much. °Still reads novels and tries to stay active.  Sometimes eats alone and doesn't mind it. °Patient reports stable mood and denies depressed or irritable moods.  Patient denies difficulty with sleep initiation or maintenance. Denies appetite disturbance.  Patient reports that energy and motivation have been good.  Patient denies any difficulty with concentration.  Patient denies any suicidal ideation. °Plan: She needs the meds.  No med changes indicated.   ° °11/24/21 appt noted: °Can't do as much for herself and needs help.  Conisdering move to asst living. °Has met with people in charge at [facility].  Not as many social opportunities and more lonely with family far.  I think I'll be OK. °Needs to eat meals with people. °No problems with meds.  No SE °Chronic bowel issues.   °Ativan helps and notices if misses. ° °Past Psychiatric Medication Trials:  Mirtazapine, nefazodone SE, Lexapro 10 °Ativan, Ambien ° °Review of Systems:  °Review of Systems  °Constitutional:  Positive for fatigue.  °HENT:  Positive for tinnitus.   °Cardiovascular:  Negative for palpitations.  °Gastrointestinal:   Positive for constipation. Negative for abdominal pain and diarrhea.  °     Frequent BM  °Musculoskeletal:  Positive for gait problem.  °Neurological:  Positive for tremors and weakness.  °Psychiatric/Behavioral:  Positive for sleep disturbance. Negative for agitation, behavioral problems, confusion, decreased concentration, dysphoric mood, hallucinations, self-injury and suicidal ideas. The patient is nervous/anxious. The patient is not hyperactive.  GI px managed. ° °Medications: I have reviewed the patient's current medications. ° °Current Outpatient Medications  °Medication Sig Dispense Refill  ° acetaminophen (TYLENOL) 325   MG tablet Take 325-650 mg by mouth every 8 (eight) hours as needed for mild pain or headache.     ° amLODipine (NORVASC) 5 MG tablet Take 1 tablet (5 mg total) by mouth daily. 30 tablet 0  ° aspirin EC 81 MG EC tablet Take 1 tablet (81 mg total) by mouth daily. 30 tablet 0  ° atorvastatin (LIPITOR) 40 MG tablet Take 1 tablet (40 mg total) by mouth daily at 6 PM. 30 tablet 0  ° Calcium Carbonate-Vitamin D3 600-400 MG-UNIT TABS Take 1 tablet by mouth daily.     ° clopidogrel (PLAVIX) 75 MG tablet Take 1 tablet (75 mg total) by mouth daily. 21 tablet 0  ° escitalopram (LEXAPRO) 10 MG tablet Take 1 tablet (10 mg total) by mouth daily. 90 tablet 2  ° fluticasone (FLONASE) 50 MCG/ACT nasal spray Place 1 spray into both nostrils at bedtime.    ° loratadine (CLARITIN) 10 MG tablet Take 10 mg by mouth daily as needed for allergies or rhinitis.     ° Methylcellulose, Laxative, (CITRUCEL PO) Take by mouth See admin instructions. Mix 4.5 heaping teaspoonsful of powder into 8 ounces of water and drink once a day at bedtime    ° Multiple Vitamin (MULTIVITAMIN WITH MINERALS) TABS tablet Take 1 tablet by mouth daily.    ° mupirocin ointment (BACTROBAN) 2 % Apply 1 application topically See admin instructions. Apply to the right arm 2 times a day    ° phenylephrine-shark liver oil-mineral oil-petrolatum  (PREPARATION H) 0.25-14-74.9 % rectal ointment Place 1 application rectally 2 (two) times daily as needed for hemorrhoids.    ° quinapril (ACCUPRIL) 40 MG tablet Take 40 mg by mouth in the morning.     ° simethicone (MYLICON) 125 MG chewable tablet Chew 125 mg by mouth every 6 (six) hours as needed for flatulence.    ° SUMAtriptan (IMITREX) 25 MG tablet Take 25 mg by mouth once as needed for migraine.    ° LORazepam (ATIVAN) 0.5 MG tablet Take 1 tablet (0.5 mg total) by mouth every evening. 30 tablet 5  ° LORazepam (ATIVAN) 1 MG tablet TAKE ONE (1) TABLET BY MOUTH TWO (2) TIMES DAILY AT 8AM AND 10PM 60 tablet 5  ° mirtazapine (REMERON) 45 MG tablet Take 1 tablet (45 mg total) by mouth at bedtime. 90 tablet 1  ° zolpidem (AMBIEN) 5 MG tablet Take 1 tablet (5 mg total) by mouth at bedtime as needed. 30 tablet 5  ° °No current facility-administered medications for this visit.  ° ° °Medication Side Effects: Other: ? sedation related. ° °Allergies:  °Allergies  °Allergen Reactions  ° Adhesive [Tape] Other (See Comments)  °  Caused SEVERE skin irritation  ° Cefdinir Other (See Comments)  °  Caused C-Diff  ° Amitiza [Lubiprostone] Diarrhea  ° Erythromycin Nausea And Vomiting  ° Flagyl [Metronidazole] Diarrhea  ° Meloxicam Other (See Comments)  °  Dizziness resulted after taking it   ° ° °Past Medical History:  °Diagnosis Date  ° Allergy   ° Anal fissure   ° Anxiety   ° Basal cell carcinoma   ° Cataracts, bilateral   ° Clostridium difficile diarrhea   ° Depression   ° Diverticulosis   ° hx of stricture in colon  ° Hemorrhoids   ° Hypertension   ° IBS (irritable bowel syndrome)   ° Insomnia   ° Migraine   ° Osteopenia   ° Thrombosed hemorrhoids   ° ° °Family History  °Problem Relation   Age of Onset   Alzheimer's disease Mother    Heart failure Mother    Heart disease Father    Heart failure Father    Other Maternal Grandfather        cerebral hemorrhage   Skin cancer Brother    Heart disease Brother    Colon cancer  Neg Hx    Stomach cancer Neg Hx    Pancreatic cancer Neg Hx     Social History   Socioeconomic History   Marital status: Married    Spouse name: Christina Escobar   Number of children: 2   Years of education: MA   Highest education level: Not on file  Occupational History   Occupation: retired Pharmacist, hospital  Tobacco Use   Smoking status: Never   Smokeless tobacco: Never  Vaping Use   Vaping Use: Never used  Substance and Sexual Activity   Alcohol use: Yes    Alcohol/week: 0.0 standard drinks    Comment: occasional glass of wine -2 per week   Drug use: No   Sexual activity: Not on file  Other Topics Concern   Not on file  Social History Narrative   Pt lives at home with her Spouse. She has 3 children, (1 adopted). Spouse is a retired Film/video editor.   Caffeine Use: 1-2 cups daily.   Social Determinants of Health   Financial Resource Strain: Not on file  Food Insecurity: Not on file  Transportation Needs: Not on file  Physical Activity: Not on file  Stress: Not on file  Social Connections: Not on file  Intimate Partner Violence: Not on file    Past Medical History, Surgical history, Social history, and Family history were reviewed and updated as appropriate.   Please see review of systems for further details on the patient's review from today.   Objective:   Physical Exam:  There were no vitals taken for this visit.  Physical Exam Constitutional:      Appearance: Normal appearance.  Neurological:     Mental Status: She is alert.     Motor: Weakness present. No tremor.     Gait: Gait abnormal.     Comments: Using walker still  Psychiatric:        Attention and Perception: Attention and perception normal.        Mood and Affect: Mood is anxious. Mood is not depressed.        Speech: Speech normal. Speech is not slurred.        Behavior: Behavior is not slowed. Behavior is cooperative.        Thought Content: Thought content is not delusional. Thought content does not include  homicidal or suicidal ideation.        Cognition and Memory: Cognition normal.     Comments: Insight and judgment good. Chronic anxiety manageable but up and down     Lab Review:     Component Value Date/Time   NA 138 12/20/2019 1140   K 4.4 12/20/2019 1140   CL 102 12/20/2019 1140   CO2 26 12/20/2019 1140   GLUCOSE 106 (H) 12/20/2019 1140   BUN 12 12/20/2019 1140   CREATININE 0.96 12/20/2019 1140   CALCIUM 9.9 12/20/2019 1140   PROT 7.0 12/20/2019 1140   ALBUMIN 4.0 12/20/2019 1140   AST 32 12/20/2019 1140   ALT 16 12/20/2019 1140   ALKPHOS 71 12/20/2019 1140   BILITOT 0.7 12/20/2019 1140   GFRNONAA 53 (L) 12/20/2019 1140   GFRAA >60 12/20/2019 1140  Component Value Date/Time   WBC 5.7 12/20/2019 1140   RBC 4.11 12/20/2019 1140   HGB 12.6 12/20/2019 1140   HCT 38.6 12/20/2019 1140   PLT 244 12/20/2019 1140   MCV 93.9 12/20/2019 1140   MCH 30.7 12/20/2019 1140   MCHC 32.6 12/20/2019 1140   RDW 11.5 12/20/2019 1140   LYMPHSABS 2.2 12/20/2019 1140   MONOABS 0.4 12/20/2019 1140   EOSABS 0.1 12/20/2019 1140   BASOSABS 0.1 12/20/2019 1140    No results found for: POCLITH, LITHIUM   No results found for: PHENYTOIN, PHENOBARB, VALPROATE, CBMZ   .res Assessment: Plan:    Christina Escobar was seen today for follow-up and anxiety.  Diagnoses and all orders for this visit:  Generalized anxiety disorder -     LORazepam (ATIVAN) 0.5 MG tablet; Take 1 tablet (0.5 mg total) by mouth every evening. -     LORazepam (ATIVAN) 1 MG tablet; TAKE ONE (1) TABLET BY MOUTH TWO (2) TIMES DAILY AT 8AM AND 10PM -     mirtazapine (REMERON) 45 MG tablet; Take 1 tablet (45 mg total) by mouth at bedtime.  Insomnia due to mental condition -     zolpidem (AMBIEN) 5 MG tablet; Take 1 tablet (5 mg total) by mouth at bedtime as needed.   Greater than 50% of 30 min face to face time with patient was spent on counseling and coordination of care. We discussed Ms. Stella has a long history of  anxiety which in the past had contributed to unmanageable diarrhea another bowel symptoms including bowel pain.  These symptoms were much improved with a combination of lorazepam and mirtazapine.  She has some chronic insomnia which is generally manageable with the medication.   We had been able to reduce the lorazepam some over the last couple of years and she is probably at the lowest tolerated dose and lowest effective dose. Lexapro helped some.  Anxiety is chronic but not disabling.  She's satisfied with meds.  Pt does not describe dysphoric OC sx.  Sad not depressed.  Med sensitive.  Disc relationship between IBS and diarrhea alternating with constipation.  She feels it's manageable at this time.  Better after Lexapro 10 mg daily increased on 07/29/20.   IBS diarrhea better with increase.    Do not reduce lorazepam further She takes lorazepam 1 mg at 7A and HS takes 0.5 mg  at 5 PM.  Sleep hygiene disc in detail and esp in relation to napping.  We discussed the short-term risks associated with benzodiazepines including sedation and increased fall risk among others.  Discussed long-term side effect risk including dependence, potential withdrawal symptoms, and the potential eventual dose-related risk of dementia.  But recent studies from 2020 dispute this association between benzodiazepines and dementia risk. Newer studies in 2020 do not support an association with dementia.  Use Ambien prn.  Disc risk amnesia.    She needs the meds.  No med changes indicated.    Supportive and cognitive therapy techniques around age-related declines that both she and loss of husband and  stress of upcoming move to [facility] living.  She appreciates the support and doesn't want to spread out visits. Did some grief counseling   Answered questions about mild anemia 11.7 hgb in October 2022   This appt was 30 mins.  FU 4-6 control everybody restricted mos  Lynder Parents, MD, DFAPA   Please see After Visit  Summary for patient specific instructions.  No future appointments.  No orders of  the defined types were placed in this encounter. ° ° °  °------------------------------- °

## 2022-05-19 ENCOUNTER — Ambulatory Visit (INDEPENDENT_AMBULATORY_CARE_PROVIDER_SITE_OTHER): Payer: Medicare Other | Admitting: Psychiatry

## 2022-05-19 ENCOUNTER — Encounter: Payer: Self-pay | Admitting: Psychiatry

## 2022-05-19 DIAGNOSIS — F5105 Insomnia due to other mental disorder: Secondary | ICD-10-CM

## 2022-05-19 DIAGNOSIS — F4321 Adjustment disorder with depressed mood: Secondary | ICD-10-CM

## 2022-05-19 DIAGNOSIS — F411 Generalized anxiety disorder: Secondary | ICD-10-CM

## 2022-05-19 DIAGNOSIS — F439 Reaction to severe stress, unspecified: Secondary | ICD-10-CM

## 2022-05-19 NOTE — Progress Notes (Signed)
[name] [id number] Jan 17, 1930 88 y.o.  Subjective:   Patient ID:  Christina Escobar is a 88 y.o. (DOB November 20, 1929) female.  Chief Complaint:  Chief Complaint  Patient presents with   Follow-up   Anxiety   Stress    Anxiety Symptoms include dizziness and nervous/anxious behavior. Patient reports no confusion, decreased concentration, palpitations or suicidal ideas.    Depression        Associated symptoms include fatigue.  Associated symptoms include no decreased concentration and no suicidal ideas.  Past medical history includes anxiety.    Christina Escobar presents to the office today for follow-up of chronic anxiety.    Seen December 2020.  No meds were changed.  QT DT Covid.  Big sadness that Christina Escobar moving to [place].     Ambien used rarely. Not used #30 since Feb.  02/13/20 appt the following noted: Stroke 12/17/19 and hosp 4 days.  Then went to [facility] and upset she was given the wrong meds.  They reduced lorazepam from baseline to 0.5 mg BID and pt had NV from it and felt anxious.  These sx resolved with the return to lorazepam to 1 mg AM and 8 PM and 0.5 mg at 5 PM.    Upset she saw mice in the room.  After about a week had a fall bc of the mouse.   I can't get over it RE: poor health care from [facility].   Has since resumed normal dose of lorazepam. Still on mirtazapine 45 mg nightly. Hard time with decisions.  Not confident.  Hard to enjoy normal things.  Low energy. Fears ever having to go back to [employer].  Traumatized by the experience.  Always tense. Plan: continue She takes lorazepam 1 mg at 7A and HS takes 0.5 mg  at 5 PM.  Add Lexapro 5 for anxiety  04/15/20 appt with the following noted: Lexapro has been a big help.  I think I it's helped.  Sleep better and handled Christina Escobar's death better than usual and less IBS.  Handles IBS better. Christina Escobar died May 02, 2020. He was ready to die.  That helped me a lot.  He was 86 yo.  Had a lot of  family support for awhile but family gone. If misses sleep then next night takes Ambien.  1 son in law locally and that's it. Lost the dog too bc can't care for it. Moving to assisted living at [facility].  08/19/20 appt with following noted; Got real upset with move to apt.  I just can't accept it.  Asked to have Lexapro increased by PCP to 10 mg daily about 3 weeks ago.  It's really helped the IBS better than any med so far.  Really upset with various things and has done fine with previous moves.  Things damaged and lost. Family says she should just let it go but can't. Wonders about getting a PCP with geriatric experience.   Son will visit at Christmas.  Christina and Christina Escobar will come at Christmas also.  Both sons really helpful. Christina Escobar Christina has baby girl premature and doing better. Plan continue Lexapro but increase to 10 mg daily.  11/18/2020 appointment with the following noted: Increased Lexapro 10 daily and has been doing well with it.    It's helped the diarrhea tremendously more than other GI treatments.  So I love it. No SE. Frustrated dealing with urinary incontinence. Had been dealing with so many loses since here  and at the last appt. Been busy taking care of evertything.  Christina Escobar died in 2023/09/21 in [place].  One of kids took her there.   Upset PCP resigned a couple of weeks ago.  Getting a new one. Worry over not getting comprehensive care.    Questions about her BP Christina Christina Escobar is so happy with her baby girl.    Christina Escobar. Plan: No med changes  03/24/2021 appointment with the following noted: Was doing alright.  Friday Wellness checkup. Tense and sad over events.  Son and D both sold homes and will move far away.  One in [place] and one in [place].  Christmas always been at their house and now they've planned to move away.  Middle son less available.  So sad about it. Making more friends where she lives.  Has 2. Youngest son's D came to [facility] from [employer] [place].  Son got better job in  [place]. She finished her program. She will continue college but move for the summer. Sad not depressed. Youngest son [name] is POA and moving to [place] with his inlaws. 6 mos in her apt.  Hard time for her. Some mornings awaken with anxiety.  Easy startle. Sleeping well bc worn out. Usually naps. Plan:Better after Lexapro 10 mg daily increased on 07/29/20.   IBS diarrhea resolved with increase.   Do not reduce lorazepam further She takes lorazepam 1 mg at 7A and HS takes 0.5 mg  at 5 PM.   07/21/21 appt noted: No migraine and doesn't mind diarrhea so no immodium. Hx CVA 12/17/19  and followed at [facility].  R leg weakness and using a walker.  Fatigue also. Got to see Christina Escobar and 80 yo baby. D came and said pt has OC sx.  Pt says she just wants to do things herself.  Pt prefers arrangment of things in her bathroom.  D thinks she worries too much. Still reads novels and tries to stay active.  Sometimes eats alone and doesn't mind it. Patient reports stable mood and denies depressed or irritable moods.  Patient denies difficulty with sleep initiation or maintenance. Denies appetite disturbance.  Patient reports that energy and motivation have been good.  Patient denies any difficulty with concentration.  Patient denies any suicidal ideation. Plan: She needs the meds.  No med changes indicated.    11/24/21 appt noted: Can't do as much for herself and needs help.  Conisdering move to asst living. Has met with people in charge at [facility].  Not as many social opportunities and more lonely with family far.  I think I'll be OK. Needs to eat meals with people. No problems with meds.  No SE Chronic bowel issues.   Ativan helps and notices if misses.  05/19/2022 appointment noted: Nervous every morning. Sleep better with gabapening 100 mg hs Hates the [facility].  Not as much help.  Doesn't like the lack of control of lorazepam which creates anxiety.  Activities interfere with  nap. Tired all the time.  Uses scooter which helped a  lot. Gets dizzy in middle of the night.  Past Psychiatric Medication Trials:  Mirtazapine, nefazodone SE, Lexapro 10 Ativan, Ambien Gabapentin helps sleep  Review of Systems:  Review of Systems  Constitutional:  Positive for fatigue.  HENT:  Positive for tinnitus.   Cardiovascular:  Negative for palpitations.  Gastrointestinal:  Positive for constipation. Negative for abdominal pain and diarrhea.       Frequent BM  Musculoskeletal:  Positive for gait problem.  Neurological:  Positive for dizziness, tremors and weakness.  Psychiatric/Behavioral:  Positive for sleep disturbance. Negative for agitation, behavioral problems, confusion, decreased concentration, dysphoric mood, hallucinations, self-injury and suicidal ideas. The patient is nervous/anxious. The patient is not hyperactive.   GI px managed.  Medications: I have reviewed the patient's current medications.  Current Outpatient Medications  Medication Sig Dispense Refill   acetaminophen (TYLENOL) 325 MG tablet Take 325-650 mg by mouth every 8 (eight) hours as needed for mild pain or headache.      amLODipine (NORVASC) 5 MG tablet Take 1 tablet (5 mg total) by mouth daily. 30 tablet 0   aspirin EC 81 MG EC tablet Take 1 tablet (81 mg total) by mouth daily. 30 tablet 0   atorvastatin (LIPITOR) 40 MG tablet Take 1 tablet (40 mg total) by mouth daily at 6 PM. 30 tablet 0   Calcium Carbonate-Vitamin D3 600-400 MG-UNIT TABS Take 1 tablet by mouth daily.      clopidogrel (PLAVIX) 75 MG tablet Take 1 tablet (75 mg total) by mouth daily. 21 tablet 0   escitalopram (LEXAPRO) 10 MG tablet Take 1 tablet (10 mg total) by mouth daily. 90 tablet 2   fluticasone (FLONASE) 50 MCG/ACT nasal spray Place 1 spray into both nostrils at bedtime.     gabapentin (NEURONTIN) 100 MG capsule Take 100 mg by mouth at bedtime.     loratadine (CLARITIN) 10 MG tablet Take 10 mg by mouth daily as needed for  allergies or rhinitis.      LORazepam (ATIVAN) 0.5 MG tablet Take 1 tablet (0.5 mg total) by mouth every evening. 30 tablet 5   LORazepam (ATIVAN) 1 MG tablet TAKE ONE (1) TABLET BY MOUTH TWO (2) TIMES DAILY AT 8AM AND 10PM 60 tablet 5   Methylcellulose, Laxative, (CITRUCEL PO) Take by mouth See admin instructions. Mix 4.5 heaping teaspoonsful of powder into 8 ounces of water and drink once a day at bedtime     mirtazapine (REMERON) 45 MG tablet Take 1 tablet (45 mg total) by mouth at bedtime. 90 tablet 1   Multiple Vitamin (MULTIVITAMIN WITH MINERALS) TABS tablet Take 1 tablet by mouth daily.     mupirocin ointment (BACTROBAN) 2 % Apply 1 application topically See admin instructions. Apply to the right arm 2 times a day     phenylephrine-shark liver oil-mineral oil-petrolatum (PREPARATION Christina Escobar) 0.25-14-74.9 % rectal ointment Place 1 application rectally 2 (two) times daily as needed for hemorrhoids.     quinapril (ACCUPRIL) 40 MG tablet Take 40 mg by mouth in the morning.      simethicone (MYLICON) 818 MG chewable tablet Chew 125 mg by mouth every 6 (six) hours as needed for flatulence.     SUMAtriptan (IMITREX) 25 MG tablet Take 25 mg by mouth once as needed for migraine.     zolpidem (AMBIEN) 5 MG tablet Take 1 tablet (5 mg total) by mouth at bedtime as needed. (Patient taking differently: Take 5 mg by mouth at bedtime as needed. prn) 30 tablet 5   No current facility-administered medications for this visit.    Medication Side Effects: Other: ? sedation related.  Allergies:  Allergies  Allergen Reactions   Adhesive [Tape] Other (See Comments)    Caused SEVERE skin irritation   Cefdinir Other (See Comments)    Caused C-Diff   Amitiza [Lubiprostone] Diarrhea   Erythromycin Nausea And Vomiting   Flagyl [Metronidazole] Diarrhea   Meloxicam Other (See Comments)  Dizziness resulted after taking it     Past Medical History:  Diagnosis Date   Allergy    Anal fissure    Anxiety    Basal  cell carcinoma    Cataracts, bilateral    Clostridium difficile diarrhea    Depression    Diverticulosis    hx of stricture in colon   Hemorrhoids    Hypertension    IBS (irritable bowel syndrome)    Insomnia    Migraine    Osteopenia    Thrombosed hemorrhoids     Family History  Problem Relation Age of Onset   Alzheimer's disease Mother    Heart failure Mother    Heart disease Father    Heart failure Father    Other Maternal Grandfather        cerebral hemorrhage   Skin cancer Brother    Heart disease Brother    Colon cancer Neg Hx    Stomach cancer Neg Hx    Pancreatic cancer Neg Hx     Social History   Socioeconomic History   Marital status: Married    Spouse name: Paediatric nurse   Number of children: 2   Years of education: MA   Highest education level: Not on file  Occupational History   Occupation: retired Pharmacist, hospital  Tobacco Use   Smoking status: Never   Smokeless tobacco: Never  Vaping Use   Vaping Use: Never used  Substance and Sexual Activity   Alcohol use: Yes    Alcohol/week: 0.0 standard drinks of alcohol    Comment: occasional glass of wine -2 per week   Drug use: No   Sexual activity: Not on file  Other Topics Concern   Not on file  Social History Narrative   Pt lives at home with her Spouse. She has 3 children, (1 adopted). Spouse is a retired Film/video editor.   Caffeine Use: 1-2 cups daily.   Social Determinants of Health   Financial Resource Strain: Not on file  Food Insecurity: Not on file  Transportation Needs: Not on file  Physical Activity: Not on file  Stress: Not on file  Social Connections: Not on file  Intimate Partner Violence: Not on file    Past Medical History, Surgical history, Social history, and Family history were reviewed and updated as appropriate.   Please see review of systems for further details on the patient's review from today.   Objective:   Physical Exam:  There were no vitals taken for this visit.  Physical  Exam Constitutional:      Appearance: Normal appearance.  Neurological:     Mental Status: She is alert.     Motor: Weakness present. No tremor.     Gait: Gait abnormal.     Comments: Using walker still  Psychiatric:        Attention and Perception: Attention and perception normal.        Mood and Affect: Mood is anxious. Mood is not depressed.        Speech: Speech normal. Speech is not slurred.        Behavior: Behavior is not slowed. Behavior is cooperative.        Thought Content: Thought content is not delusional. Thought content does not include homicidal or suicidal ideation.        Cognition and Memory: Cognition normal.     Comments: Insight and judgment good. Chronic anxiety worse situationally      Lab Review:     Component Value Date/Time  NA 138 12/20/2019 1140   K 4.4 12/20/2019 1140   CL 102 12/20/2019 1140   CO2 26 12/20/2019 1140   GLUCOSE 106 (Christina Escobar) 12/20/2019 1140   BUN 12 12/20/2019 1140   CREATININE 0.96 12/20/2019 1140   CALCIUM 9.9 12/20/2019 1140   PROT 7.0 12/20/2019 1140   ALBUMIN 4.0 12/20/2019 1140   AST 32 12/20/2019 1140   ALT 16 12/20/2019 1140   ALKPHOS 71 12/20/2019 1140   BILITOT 0.7 12/20/2019 1140   GFRNONAA 53 (L) 12/20/2019 1140   GFRAA >60 12/20/2019 1140       Component Value Date/Time   WBC 5.7 12/20/2019 1140   RBC 4.11 12/20/2019 1140   HGB 12.6 12/20/2019 1140   HCT 38.6 12/20/2019 1140   PLT 244 12/20/2019 1140   MCV 93.9 12/20/2019 1140   MCH 30.7 12/20/2019 1140   MCHC 32.6 12/20/2019 1140   RDW 11.5 12/20/2019 1140   LYMPHSABS 2.2 12/20/2019 1140   MONOABS 0.4 12/20/2019 1140   EOSABS 0.1 12/20/2019 1140   BASOSABS 0.1 12/20/2019 1140    No results found for: "POCLITH", "LITHIUM"   No results found for: "PHENYTOIN", "PHENOBARB", "VALPROATE", "CBMZ"   .res Assessment: Plan:    Jermaine was seen today for follow-up, anxiety and stress.  Diagnoses and all orders for this visit:  Generalized anxiety  disorder  Insomnia due to mental condition  Grief  Stress at home   Greater than 50% of 30 min face to face time with patient was spent on counseling and coordination of care. We discussed Ms. Gossard has a long history of anxiety which in the past had contributed to unmanageable diarrhea another bowel symptoms including bowel pain.  These symptoms were much improved with a combination of lorazepam and mirtazapine.  She has some chronic insomnia which is generally manageable with the medication until situationally worse.    We had been able to reduce the lorazepam some over the last couple of years and she is probably at the lowest tolerated dose and lowest effective dose. Lexapro helped some.  Anxiety is chronic but not disabling.  She's satisfied with meds.  Pt does not describe dysphoric OC sx.  Sad not depressed.  Med sensitive.  Disc relationship between IBS and diarrhea alternating with constipation.  She feels it's manageable at this time.  Was Better after Lexapro 10 mg daily increased on 07/29/20.   Continue mirtazapiine 45 mg HS which helped anxiety and stomach problems. IBS diarrhea better with increase.    Do not reduce lorazepam further She takes lorazepam 1 mg at 7A and HS takes 0.5 mg  at 5 PM.  Sleep better with gabapentin.  Disc timing of meds.  We discussed the short-term risks associated with benzodiazepines including sedation and increased fall risk among others.  Discussed long-term side effect risk including dependence, potential withdrawal symptoms, and the potential eventual dose-related risk of dementia.  But recent studies from 2020 dispute this association between benzodiazepines and dementia risk. Newer studies in 2020 do not support an association with dementia.  Use Ambien prn.  Disc risk amnesia.    She needs the meds.  No med changes indicated.    Supportive and cognitive therapy techniques around age-related declines that both she and loss of husband  and  stress of upcoming move to asst living.  She appreciates the support and doesn't want to spread out visits.  Disc unhappy with new Asst Living at [facility].   This appt was 30 mins.  FU 4-      6 mos  Lynder Parents, MD, DFAPA   Please see After Visit Summary for patient specific instructions.  No future appointments.  No orders of the defined types were placed in this encounter.     -------------------------------

## 2022-05-25 ENCOUNTER — Other Ambulatory Visit: Payer: Self-pay | Admitting: Psychiatry

## 2022-05-25 DIAGNOSIS — F411 Generalized anxiety disorder: Secondary | ICD-10-CM

## 2022-07-27 ENCOUNTER — Other Ambulatory Visit: Payer: Self-pay | Admitting: Psychiatry

## 2022-07-27 DIAGNOSIS — F411 Generalized anxiety disorder: Secondary | ICD-10-CM

## 2022-09-21 ENCOUNTER — Ambulatory Visit: Payer: Medicare Other | Admitting: Psychiatry

## 2022-10-13 ENCOUNTER — Ambulatory Visit (INDEPENDENT_AMBULATORY_CARE_PROVIDER_SITE_OTHER): Payer: Medicare Other | Admitting: Psychiatry

## 2022-10-13 ENCOUNTER — Encounter: Payer: Self-pay | Admitting: Psychiatry

## 2022-10-13 DIAGNOSIS — F411 Generalized anxiety disorder: Secondary | ICD-10-CM | POA: Diagnosis not present

## 2022-10-13 DIAGNOSIS — F5105 Insomnia due to other mental disorder: Secondary | ICD-10-CM

## 2022-10-13 MED ORDER — LORAZEPAM 0.5 MG PO TABS: ORAL_TABLET | ORAL | 5 refills | Status: DC

## 2022-10-13 MED ORDER — ESCITALOPRAM OXALATE 10 MG PO TABS: 10.0000 mg | ORAL_TABLET | Freq: Every day | ORAL | 2 refills | Status: DC

## 2022-10-13 NOTE — Progress Notes (Signed)
Christina Escobar [id number] 1929-11-14 89 y.o.  Subjective:   Patient ID:  Christina Escobar is a 89 y.o. (DOB 04/18/1930) female.  Chief Complaint:  Chief Complaint  Patient presents with   Follow-up   Anxiety   Depression    Anxiety Symptoms include dizziness and nervous/anxious behavior. Patient reports no confusion, decreased concentration, palpitations, shortness of breath or suicidal ideas.    Depression        Associated symptoms include fatigue.  Associated symptoms include no decreased concentration and no suicidal ideas.  Past medical history includes anxiety.    Christina Escobar presents to the office today for follow-up of chronic anxiety.    Seen December 2020.  No meds were changed.  QT DT Covid.  Big sadness that Christina Escobar moving to [place].     Ambien used rarely. Not used #30 since Feb.  02/13/20 appt the following noted: Stroke 12/17/19 and hosp 4 days.  Then went to [facility] and upset she was given the wrong meds.  They reduced lorazepam from baseline to 0.5 mg BID and pt had NV from it and felt anxious.  These sx resolved with the return to lorazepam to 1 mg AM and 8 PM and 0.5 mg at 5 PM.    Upset she saw mice in the room.  After about a week had a fall bc of the mouse.   I can't get over it RE: poor health care from [facility].   Has since resumed normal dose of lorazepam. Still on mirtazapine 45 mg nightly. Hard time with decisions.  Not confident.  Hard to enjoy normal things.  Low energy. Fears ever having to go back to [employer].  Traumatized by the experience.  Always tense. Plan: continue She takes lorazepam 1 mg at 7A and HS takes 0.5 mg  at 5 PM.  Add Lexapro 5 for anxiety  04/15/20 appt with the following noted: Lexapro has been a big help.  I think I it's helped.  Sleep better and handled Christina Escobar's death better than usual and less IBS.  Handles IBS better. Christina Escobar died April 16, 2020. He was ready to die.  That helped me a lot.  He  was 89 yo.  Had a lot of family support for awhile but family gone. If misses sleep then next night takes Ambien.  1 son in law locally and that's it. Lost the dog too bc can't care for it. Moving to assisted living at [facility].  08/19/20 appt with following noted; Got real upset with move to apt.  I just can't accept it.  Asked to have Lexapro increased by PCP to 10 mg daily about 3 weeks ago.  It's really helped the IBS better than any med so far.  Really upset with various things and has done fine with previous moves.  Things damaged and lost. Family says she should just let it go but can't. Wonders about getting a PCP with geriatric experience.   Son will visit at Christmas.  Christina and D-in-law will come at Christmas also.  Both sons really helpful. Christina Escobar Christina has baby girl premature and doing better. Plan continue Lexapro but increase to 10 mg daily.  11/18/2020 appointment with the following noted: Increased Lexapro 10 daily and has been doing well with it.    It's helped the diarrhea tremendously more than other GI treatments.  So I love it. No SE. Frustrated dealing with urinary incontinence. Had been dealing with so many  loses since here and at the last appt. Been busy taking care of evertything.  Christina Escobar died in 09/21/23 in [place].  One of kids took her there.   Upset PCP resigned a couple of weeks ago.  Getting a new one. Worry over not getting comprehensive care.    Questions about her BP Christina Christina Escobar is so happy with her baby girl.    [place]. Plan: No med changes  03/24/2021 appointment with the following noted: Was doing alright.  Friday Wellness checkup. Tense and sad over events.  Son and D both sold homes and will move far away.  One in [place] and one in [place].  Christmas always been at their house and now they've planned to move away.  Middle son less available.  So sad about it. Making more friends where she lives.  Has 2. Youngest son's D came to [facility] from [employer]., Christina Escobar.  Son  got better job in [place]. She finished her program. She will continue college but move for the summer. Sad not depressed. Youngest son Christina Escobar is POA and moving to [place] with his inlaws. 6 mos in her apt.  Hard time for her. Some mornings awaken with anxiety.  Easy startle. Sleeping well bc worn out. Usually naps. Plan:Better after Lexapro 10 mg daily increased on 07/29/20.   IBS diarrhea resolved with increase.   Do not reduce lorazepam further She takes lorazepam 1 mg at 7A and HS takes 0.5 mg  at 5 PM.   07/21/21 appt noted: No migraine and doesn't mind diarrhea so no immodium. Hx CVA 12/17/19  and followed at [facility].  R leg weakness and using a walker.  Fatigue also. Got to see Christina Escobar and 40 yo baby. D came and said pt has OC sx.  Pt says she just wants to do things herself.  Pt prefers arrangment of things in her bathroom.  D thinks she worries too much. Still reads novels and tries to stay active.  Sometimes eats alone and doesn't mind it. Patient reports stable mood and denies depressed or irritable moods.  Patient denies difficulty with sleep initiation or maintenance. Denies appetite disturbance.  Patient reports that energy and motivation have been good.  Patient denies any difficulty with concentration.  Patient denies any suicidal ideation. Plan: She needs the meds.  No med changes indicated.    11/24/21 appt noted: Can't do as much for herself and needs help.  Conisdering move to asst living. Has met with people in charge at [facility].  Not as many social opportunities and more lonely with family far.  I think I'll be OK. Needs to eat meals with people. No problems with meds.  No SE Chronic bowel issues.   Ativan helps and notices if misses.  05/19/2022 appointment noted: Nervous every morning. Sleep better with gabapening 100 mg hs Hates the [facility].  Not as much help.  Doesn't like the lack of control of lorazepam which creates anxiety.  Activities  interfere with nap. Tired all the time.  Uses scooter which helped a  lot. Gets dizzy in middle of the night.  10/13/22 appt noted: No PCP and needs one.  Disc getting one. Still anxiety in the AM. Questions about how many meds she is on and does this contribut to her fatigue. Kind of sad sometimes.  When has a lot to do then can do it and not so tired.  But don't always have a lot to do.  Doesn't  really like assisted living as much as independent living.  Doesn't want to move again. If busy not disturbed by sadness.  Not smiling as much as she should. Covid before Xmas.  Would like more assistance than she gets.  Not that anxiousl RLS managed with gabapentin.  Past Psychiatric Medication Trials:  Mirtazapine, nefazodone SE, Lexapro 10 Ativan, Ambien Gabapentin helps sleep  Review of Systems:  Review of Systems  Constitutional:  Positive for fatigue.  HENT:  Positive for tinnitus.   Respiratory:  Negative for shortness of breath.   Cardiovascular:  Negative for palpitations.  Gastrointestinal:  Positive for constipation. Negative for abdominal pain and diarrhea.       Frequent BM  Musculoskeletal:  Positive for gait problem.  Neurological:  Positive for dizziness, tremors and weakness.  Psychiatric/Behavioral:  Positive for sleep disturbance. Negative for agitation, behavioral problems, confusion, decreased concentration, dysphoric mood, hallucinations, self-injury and suicidal ideas. The patient is nervous/anxious. The patient is not hyperactive.   GI px managed.  Medications: I have reviewed the patient's current medications.  Current Outpatient Medications  Medication Sig Dispense Refill   acetaminophen (TYLENOL) 325 MG tablet Take 325-650 mg by mouth every 8 (eight) hours as needed for mild pain or headache.      amLODipine (NORVASC) 5 MG tablet Take 1 tablet (5 mg total) by mouth daily. 30 tablet 0   aspirin EC 81 MG EC tablet Take 1 tablet (81 mg total) by mouth daily. 30  tablet 0   atorvastatin (LIPITOR) 40 MG tablet Take 1 tablet (40 mg total) by mouth daily at 6 PM. 30 tablet 0   Calcium Carbonate-Vitamin D3 600-400 MG-UNIT TABS Take 1 tablet by mouth daily.      clopidogrel (PLAVIX) 75 MG tablet Take 1 tablet (75 mg total) by mouth daily. 21 tablet 0   fluticasone (FLONASE) 50 MCG/ACT nasal spray Place 1 spray into both nostrils at bedtime.     gabapentin (NEURONTIN) 100 MG capsule Take 100 mg by mouth at bedtime.     loratadine (CLARITIN) 10 MG tablet Take 10 mg by mouth daily as needed for allergies or rhinitis.      Methylcellulose, Laxative, (CITRUCEL PO) Take by mouth See admin instructions. Mix 4.5 heaping teaspoonsful of powder into 8 ounces of water and drink once a day at bedtime     mirtazapine (REMERON) 45 MG tablet TAKE 1 TABLET BY MOUTH AT BEDTIME 90 tablet 1   Multiple Vitamin (MULTIVITAMIN WITH MINERALS) TABS tablet Take 1 tablet by mouth daily.     mupirocin ointment (BACTROBAN) 2 % Apply 1 application topically See admin instructions. Apply to the right arm 2 times a day     phenylephrine-shark liver oil-mineral oil-petrolatum (PREPARATION Christina Escobar) 0.25-14-74.9 % rectal ointment Place 1 application rectally 2 (two) times daily as needed for hemorrhoids.     quinapril (ACCUPRIL) 40 MG tablet Take 40 mg by mouth in the morning.      simethicone (MYLICON) 211 MG chewable tablet Chew 125 mg by mouth every 6 (six) hours as needed for flatulence.     SUMAtriptan (IMITREX) 25 MG tablet Take 25 mg by mouth once as needed for migraine.     zolpidem (AMBIEN) 5 MG tablet Take 1 tablet (5 mg total) by mouth at bedtime as needed. (Patient taking differently: Take 5 mg by mouth at bedtime as needed. prn) 30 tablet 5   escitalopram (LEXAPRO) 10 MG tablet Take 1 tablet (10 mg total) by mouth daily. 90 tablet  2   LORazepam (ATIVAN) 0.5 MG tablet 1 in the AM, 1 at 5 PM, 2 tablets at night 120 tablet 5   No current facility-administered medications for this visit.     Medication Side Effects: Other: ? sedation related.  Allergies:  Allergies  Allergen Reactions   Adhesive [Tape] Other (See Comments)    Caused SEVERE skin irritation   Cefdinir Other (See Comments)    Caused C-Diff   Amitiza [Lubiprostone] Diarrhea   Erythromycin Nausea And Vomiting   Flagyl [Metronidazole] Diarrhea   Meloxicam Other (See Comments)    Dizziness resulted after taking it     Past Medical History:  Diagnosis Date   Allergy    Anal fissure    Anxiety    Basal cell carcinoma    Cataracts, bilateral    Clostridium difficile diarrhea    Depression    Diverticulosis    hx of stricture in colon   Hemorrhoids    Hypertension    IBS (irritable bowel syndrome)    Insomnia    Migraine    Osteopenia    Thrombosed hemorrhoids     Family History  Problem Relation Age of Onset   Alzheimer's disease Mother    Heart failure Mother    Heart disease Father    Heart failure Father    Other Maternal Grandfather        cerebral hemorrhage   Skin cancer Brother    Heart disease Brother    Colon cancer Neg Hx    Stomach cancer Neg Hx    Pancreatic cancer Neg Hx     Social History   Socioeconomic History   Marital status: Married    Spouse name: Paediatric nurse   Number of children: 2   Years of education: MA   Highest education level: Not on file  Occupational History   Occupation: retired Pharmacist, hospital  Tobacco Use   Smoking status: Never   Smokeless tobacco: Never  Vaping Use   Vaping Use: Never used  Substance and Sexual Activity   Alcohol use: Yes    Alcohol/week: 0.0 standard drinks of alcohol    Comment: occasional glass of wine -2 per week   Drug use: No   Sexual activity: Not on file  Other Topics Concern   Not on file  Social History Narrative   Pt lives at home with her Spouse. She has 3 children, (1 adopted). Spouse is a retired Film/video editor.   Caffeine Use: 1-2 cups daily.   Social Determinants of Health   Financial Resource Strain: Not on  file  Food Insecurity: Not on file  Transportation Needs: Not on file  Physical Activity: Not on file  Stress: Not on file  Social Connections: Not on file  Intimate Partner Violence: Not on file    Past Medical History, Surgical history, Social history, and Family history were reviewed and updated as appropriate.   Please see review of systems for further details on the patient's review from today.   Objective:   Physical Exam:  There were no vitals taken for this visit.  Physical Exam Constitutional:      Appearance: Normal appearance.  Neurological:     Mental Status: She is alert.     Motor: Weakness present. No tremor.     Gait: Gait abnormal.     Comments: Using walker still  Psychiatric:        Attention and Perception: Attention and perception normal.        Mood and  Affect: Mood is anxious and depressed.        Speech: Speech normal. Speech is not slurred.        Behavior: Behavior is not slowed. Behavior is cooperative.        Thought Content: Thought content is not delusional. Thought content does not include homicidal or suicidal ideation.        Cognition and Memory: Cognition normal.     Comments: Insight and judgment good. Chronic anxiety worse situationally Mild persistent sadness.      Lab Review:     Component Value Date/Time   NA 138 12/20/2019 1140   K 4.4 12/20/2019 1140   CL 102 12/20/2019 1140   CO2 26 12/20/2019 1140   GLUCOSE 106 (Christina Escobar) 12/20/2019 1140   BUN 12 12/20/2019 1140   CREATININE 0.96 12/20/2019 1140   CALCIUM 9.9 12/20/2019 1140   PROT 7.0 12/20/2019 1140   ALBUMIN 4.0 12/20/2019 1140   AST 32 12/20/2019 1140   ALT 16 12/20/2019 1140   ALKPHOS 71 12/20/2019 1140   BILITOT 0.7 12/20/2019 1140   GFRNONAA 53 (L) 12/20/2019 1140   GFRAA >60 12/20/2019 1140       Component Value Date/Time   WBC 5.7 12/20/2019 1140   RBC 4.11 12/20/2019 1140   HGB 12.6 12/20/2019 1140   HCT 38.6 12/20/2019 1140   PLT 244 12/20/2019 1140    MCV 93.9 12/20/2019 1140   MCH 30.7 12/20/2019 1140   MCHC 32.6 12/20/2019 1140   RDW 11.5 12/20/2019 1140   LYMPHSABS 2.2 12/20/2019 1140   MONOABS 0.4 12/20/2019 1140   EOSABS 0.1 12/20/2019 1140   BASOSABS 0.1 12/20/2019 1140    No results found for: "POCLITH", "LITHIUM"   No results found for: "PHENYTOIN", "PHENOBARB", "VALPROATE", "CBMZ"   .res Assessment: Plan:    Mahlia was seen today for follow-up, anxiety and depression.  Diagnoses and all orders for this visit:  Generalized anxiety disorder -     Discontinue: LORazepam (ATIVAN) 0.5 MG tablet; 1 in the AM, 1 at 5 PM, 2 tablets at night -     escitalopram (LEXAPRO) 10 MG tablet; Take 1 tablet (10 mg total) by mouth daily. -     LORazepam (ATIVAN) 0.5 MG tablet; 1 in the AM, 1 at 5 PM, 2 tablets at night  Insomnia due to mental condition   Greater than 50% of 30 min face to face time with patient was spent on counseling and coordination of care. We discussed Ms. Haven has a long history of anxiety which in the past had contributed to unmanageable diarrhea another bowel symptoms including bowel pain.  These symptoms were much improved with a combination of lorazepam and mirtazapine.  She has some chronic insomnia which is generally manageable with the medication until situationally worse.    We had been able to reduce the lorazepam some over the last couple of years and she is probably at the lowest tolerated dose and lowest effective dose. Lexapro helped some.  Anxiety is chronic but not disabling.  She's satisfied with meds.  Pt does not describe dysphoric OC sx.  Sad not depressed.  Med sensitive.  Previously Disc relationship between IBS and diarrhea alternating with constipation.  She feels it's manageable at this time.  Was Better after Lexapro 10 mg daily increased on 07/29/20.    Continue mirtazapiine 45 mg HS which helped anxiety and stomach problems. IBS diarrhea better with increase.    Option reduce  lorazepam further to try  to minimize risk, but historically anxiety is worse in the AM She takes lorazepam 1 mg at 7A and HS takes 0.5 mg  at 5 PM.   OK reduce lorazepam 0.5 mg at 7 AM,  0.5 mg at 5 PM and 1 mg HS Sleep better with gabapentin.  Disc timing of meds.  We discussed the short-term risks associated with benzodiazepines including sedation and increased fall risk among others.  Discussed long-term side effect risk including dependence, potential withdrawal symptoms, and the potential eventual dose-related risk of dementia.  But recent studies from 2020 dispute this association between benzodiazepines and dementia risk. Newer studies in 2020 do not support an association with dementia.  Use Ambien prn.  Disc risk amnesia.    She needs the meds.    Supportive and cognitive therapy techniques around age-related declines that both she and loss of husband and  stress of upcoming move to asst living.  She appreciates the support and doesn't want to spread out visits.  Disc unhappy with new Asst Living at [facility].  Disc value of giving input to staff to improve communication.     This appt was 30 mins.  FU 4- mos  Lynder Parents, MD, DFAPA   Please see After Visit Summary for patient specific instructions.  No future appointments.  No orders of the defined types were placed in this encounter.     -------------------------------

## 2022-10-29 ENCOUNTER — Ambulatory Visit: Payer: Medicare Other | Admitting: Family Medicine

## 2022-11-12 NOTE — Progress Notes (Signed)
Subjective:  Patient ID: Christina Escobar, female    DOB: [birth year], 89 y.o.   MRN: [id number]  CC: New Patient  HPI:  Christina Escobar is a very pleasant 89 y.o. female who presents today to establish care.  Patient's psychiatrist recommended that she establish with a geriatrician - recommended Dr. 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-on aspirin 81, statin Hypertension on Norvasc 5 mg daily, lisinopril 40 mg daily Hyperlipidemia on Lipitor 40 mg daily Anxiety on Ativan 3 times daily, Lexapro 10 mg daily (prescribed by psychiatrist) Allergies on Claritin Insomnia on Remeron nightly and as needed Ambien (prescribed by psychiatrist) IBS with constipation on MiraLAX, Citrucel Hemorrhoids takes preparation H Reflux takes Tums Varicose veins     Surgical Hx: Past Surgical History:  Procedure Laterality Date   APPENDECTOMY     BASAL CELL CARCINOMA EXCISION  2007,2008   CATARACT EXTRACTION, BILATERAL Bilateral 1999   COLON RESECTION  2001   COLONOSCOPY WITH PROPOFOL N/A 10/22/2016   Procedure: COLONOSCOPY WITH PROPOFOL;  Surgeon: Milus Banister, MD;  Location: [facility];  Service: Endoscopy;  Laterality: N/A;   FECAL TRANSPLANT N/A 10/22/2016   Procedure: FECAL TRANSPLANT;  Surgeon: Milus Banister, MD;  Location: [facility];  Service: Endoscopy;  Laterality: N/A;   INCISION AND DRAINAGE  11/05/2011   thrombosed hemorrhoid   TRANSANAL EXCISION OF RECTAL MASS WITH HEMORRHOID INJECTION  2007   anal fissure    Family Hx: Family History  Problem Relation Age of Onset   Alzheimer's disease Mother    Heart failure Mother    Heart disease Father    Heart failure Father    Other Maternal Grandfather        cerebral  hemorrhage   Skin cancer Brother    Heart disease Brother    Colon cancer Neg Hx    Stomach cancer Neg Hx    Pancreatic cancer Neg Hx     Social Hx: Current Social History  Lives at [facility] DNR    Medications:  Current Outpatient Medications:    amLODipine (NORVASC) 5 MG tablet, Take 1 tablet (5 mg total) by mouth daily., Disp: 30 tablet, Rfl: 0   aspirin EC 81 MG EC tablet, Take 1 tablet (81 mg total) by mouth daily., Disp: 30 tablet, Rfl: 0   atorvastatin (LIPITOR) 40 MG tablet, Take 1 tablet (40 mg total) by mouth daily at 6 PM., Disp: 30 tablet, Rfl: 0   Calcium Carbonate-Vitamin D3 600-400 MG-UNIT TABS, Take 1 tablet by mouth daily. , Disp: , Rfl:    escitalopram (LEXAPRO) 10 MG tablet, Take 1 tablet (10 mg total) by mouth daily., Disp: 90 tablet, Rfl: 2   fluticasone (FLONASE) 50 MCG/ACT nasal spray, Place 1 spray into both nostrils at bedtime., Disp: , Rfl:    gabapentin (NEURONTIN) 100 MG capsule, Take 100 mg by mouth at bedtime., Disp: , Rfl:    lisinopril (ZESTRIL) 40 MG tablet, Take 40 mg by mouth daily., Disp: , Rfl:    loratadine (CLARITIN) 10 MG tablet, Take 10 mg by mouth daily as needed for allergies or rhinitis. , Disp: , Rfl:    LORazepam (ATIVAN) 0.5 MG tablet, 1 in the AM, 1 at 5  PM, 2 tablets at night, Disp: 120 tablet, Rfl: 5   Methylcellulose, Laxative, (CITRUCEL PO), Take by mouth See admin instructions. Mix 4.5 heaping teaspoonsful of powder into 8 ounces of water and drink once a day at bedtime, Disp: , Rfl:    mirtazapine (REMERON) 45 MG tablet, TAKE 1 TABLET BY MOUTH AT BEDTIME, Disp: 90 tablet, Rfl: 1   Multiple Vitamin (MULTIVITAMIN WITH MINERALS) TABS tablet, Take 1 tablet by mouth daily., Disp: , Rfl:    phenylephrine-shark liver oil-mineral oil-petrolatum (PREPARATION H) 0.25-14-74.9 % rectal ointment, Place 1 application rectally 2 (two) times daily as needed for hemorrhoids., Disp: , Rfl:    polyethylene glycol (MIRALAX /  GLYCOLAX) 17 g packet, Take 17 g by mouth daily., Disp: , Rfl:    simethicone (MYLICON) 0000000 MG chewable tablet, Chew 125 mg by mouth every 6 (six) hours as needed., Disp: , Rfl:    SUMAtriptan (IMITREX) 25 MG tablet, Take 25 mg by mouth once as needed for migraine., Disp: , Rfl:    zolpidem (AMBIEN) 5 MG tablet, Take 1 tablet (5 mg total) by mouth at bedtime as needed. (Patient taking differently: Take 5 mg by mouth at bedtime as needed. prn), Disp: 30 tablet, Rfl: 5   acetaminophen (TYLENOL) 325 MG tablet, Take 325-650 mg by mouth every 8 (eight) hours as needed for mild pain or headache.  (Patient not taking: Reported on 11/13/2022), Disp: , Rfl:     Preventative Screening Colonoscopy: year 2014 benign colonic mucosa, biopsies negative, few diverticuli, anal manometry with type II dissynergy, biofeedback helped (per GI Dr. Ardis Hughs 09/19/2019) Echocardiogram: year 2021 EF 70-75%, G1DD   ROS: pertinent noted in the HPI    Objective:  BP 120/64   Pulse 80   Ht '5\' 1"'$  (1.549 m)   Wt 54.3 kg   SpO2 99%   BMI 22.60 kg/m  Vitals and nursing note reviewed  General: NAD, pleasant, able to participate in exam, uses walker to ambulate Cardiac: RRR, no murmurs auscultated Respiratory: CTAB, normal WOB Abdomen: soft, non-tender, non-distended, normoactive bowel sounds Extremities: warm and well perfused Skin: warm and dry, no rashes noted Neuro: alert, no obvious focal deficits, speech normal, 5/5 strength in bilateral upper and lower extremities Psych: Normal affect and mood  Assessment & Plan:   Encounter to establish care -No significant concerns identified today, patient is stable on current medication regimen.  She has several sedating medications prescribed by her psychiatrist, but these are controlled at her ALF and appear to be providing significant benefit in her life.  She is very pleasant today and appears to care a lot about her health.  IBS seems to be well-controlled on current  regimen.  Blood pressure also stable on current regimen.  Would like to follow-up in about 3 months to check-in on her hypertension, hyperlipidemia, and any healthcare maintenance issues.   Return in about 3 months (around 02/13/2023) for HTN, HLD. August Albino, MD 11/13/2022, 2:15 PM PGY-1, [facility]

## 2022-11-13 ENCOUNTER — Ambulatory Visit (INDEPENDENT_AMBULATORY_CARE_PROVIDER_SITE_OTHER): Payer: Medicare Other | Admitting: Family Medicine

## 2022-11-13 ENCOUNTER — Encounter: Payer: Self-pay | Admitting: Family Medicine

## 2022-11-13 VITALS — BP 120/64 | HR 80 | Ht 61.0 in | Wt 119.6 lb

## 2022-11-13 DIAGNOSIS — Z0000 Encounter for general adult medical examination without abnormal findings: Secondary | ICD-10-CM | POA: Diagnosis not present

## 2022-11-13 DIAGNOSIS — F411 Generalized anxiety disorder: Secondary | ICD-10-CM | POA: Diagnosis not present

## 2022-11-13 DIAGNOSIS — Z7689 Persons encountering health services in other specified circumstances: Secondary | ICD-10-CM

## 2022-11-13 NOTE — Patient Instructions (Signed)
It was wonderful to see you today.  Please bring ALL of your medications with you to every visit.   Updates from today's visit:  It was very nice to meet you today -I look forward to getting to know you more!  I would like to see you back in about 3 months to revisit your blood pressure, cholesterol, and other medications  Please follow up in 3 months   Thank you for choosing [facility].   Please call [phone] with any questions about today's appointment.  Please be sure to schedule follow up at the front  desk before you leave today.   August Albino, MD  Family Medicine

## 2022-11-25 ENCOUNTER — Other Ambulatory Visit: Payer: Self-pay | Admitting: Psychiatry

## 2022-11-25 DIAGNOSIS — F5105 Insomnia due to other mental disorder: Secondary | ICD-10-CM

## 2023-01-11 ENCOUNTER — Encounter: Payer: Self-pay | Admitting: Psychiatry

## 2023-01-11 ENCOUNTER — Telehealth: Payer: Self-pay | Admitting: Family Medicine

## 2023-01-11 ENCOUNTER — Ambulatory Visit: Payer: Medicare Other | Admitting: Psychiatry

## 2023-01-11 DIAGNOSIS — F411 Generalized anxiety disorder: Secondary | ICD-10-CM | POA: Diagnosis not present

## 2023-01-11 DIAGNOSIS — F439 Reaction to severe stress, unspecified: Secondary | ICD-10-CM | POA: Diagnosis not present

## 2023-01-11 DIAGNOSIS — F5105 Insomnia due to other mental disorder: Secondary | ICD-10-CM

## 2023-01-11 MED ORDER — LORAZEPAM 0.5 MG PO TABS: ORAL_TABLET | ORAL | 3 refills | Status: DC

## 2023-01-11 MED ORDER — LORAZEPAM 1 MG PO TABS: ORAL_TABLET | ORAL | 3 refills | Status: DC

## 2023-01-11 NOTE — Progress Notes (Signed)
Christina [facility] [id number] 1930/07/06 89 y.o.  Subjective:   Patient ID:  Christina [facility]obar is a 89 y.o. (DOB [birth year]) female.  Chief Complaint:  Chief Complaint  Patient presents with   Follow-up   Anxiety   Sleeping Problem    Anxiety Symptoms include dizziness and nervous/anxious behavior. Patient reports no confusion, decreased concentration, palpitations, shortness of breath or suicidal ideas.    Depression        Associated symptoms include fatigue.  Associated symptoms include no decreased concentration and no suicidal ideas.  Past medical history includes anxiety.    [facility] presents to the office today for follow-up of chronic anxiety.    Seen December 2020.  No meds were changed.  QT DT Covid.  Big sadness that GD [facility] moving to [place].     Ambien used rarely. Not used #30 since Feb.  02/13/20 appt the following noted: Stroke 12/17/19 and hosp 4 days.  Then went to [facility] and upset she was given the wrong meds.  They reduced lorazepam from baseline to 0.5 mg BID and pt had NV from it and felt anxious.  These sx resolved with the return to lorazepam to 1 mg AM and 8 PM and 0.5 mg at 5 PM.    Upset she saw mice in the room.  After about a week had a fall bc of the mouse.   I can't get over it RE: poor health care from [facility].   Has since resumed normal dose of lorazepam. Still on mirtazapine 45 mg nightly. Hard time with decisions.  Not confident.  Hard to enjoy normal things.  Low energy. Fears ever having to go back to [facility].  Traumatized by the experience.  Always tense. Plan: continue She takes lorazepam 1 mg at 7A and HS takes 0.5 mg  at 5 PM.  Add Lexapro 5 for anxiety  04/15/20 appt with the following noted: Lexapro has been a big help.  I think I it's helped.  Sleep better and handled H's death better than usual and less IBS.  Handles IBS better. H died 04-11-2020. He was ready to die.  That helped me a  lot.  He was 87 yo.  Had a lot of family support for awhile but family gone. If misses sleep then next night takes Ambien.  1 son in law locally and that's it. Lost the dog too bc can't care for it. Moving to assisted living at [facility].  08/19/20 appt with following noted; Got real upset with move to apt.  I just can't accept it.  Asked to have Lexapro increased by PCP to 10 mg daily about 3 weeks ago.  It's really helped the IBS better than any med so far.  Really upset with various things and has done fine with previous moves.  Things damaged and lost. Family says she should just let it go but can't. Wonders about getting a PCP with geriatric experience.   Son will visit at Christmas.  GD and D-in-law will come at Christmas also.  Both sons really helpful. [facility] GD has baby girl premature and doing better. Plan continue Lexapro but increase to 10 mg daily.  11/18/2020 appointment with the following noted: Increased Lexapro 10 daily and has been doing well with it.    It's helped the diarrhea tremendously more than other GI treatments.  So I love it. No SE. Frustrated dealing with urinary incontinence. Had been dealing with so  many loses since here and at the last appt. Been busy taking care of evertything.  B died in 09/17/23 in [place].  One of kids took her there.   Upset PCP resigned a couple of weeks ago.  Getting a new one. Worry over not getting comprehensive care.    Questions about her BP GD [facility] is so happy with her baby girl.    Misses [name]. Plan: No med changes  03/24/2021 appointment with the following noted: Was doing alright.  Friday Wellness checkup. Tense and sad over events.  Son and D both sold homes and will move far away.  One in [place] and one in [place].  Christmas always been at their house and now they've planned to move away.  Middle son less available.  So sad about it. Making more friends where she lives.  Has 2. Youngest son's D came to [place] [facility] from [employer]  [place].  Son got better job in [place]. She finished her program. She will continue college but move for the summer. Sad not depressed. Youngest son [facility] is POA and moving to [place] with his inlaws. 6 mos in her apt.  Hard time for her. Some mornings awaken with anxiety.  Easy startle. Sleeping well bc worn out. Usually naps. Plan:Better after Lexapro 10 mg daily increased on 07/29/20.   IBS diarrhea resolved with increase.   Do not reduce lorazepam further She takes lorazepam 1 mg at 7A and HS takes 0.5 mg  at 5 PM.   07/21/21 appt noted: No migraine and doesn't mind diarrhea so no immodium. Hx CVA 12/17/19  and followed at [facility].  R leg weakness and using a walker.  Fatigue also. Got to see [facility] and 1 yo baby. D came and said pt has OC sx.  Pt says she just wants to do things herself.  Pt prefers arrangment of things in her bathroom.  D thinks she worries too much. Still reads novels and tries to stay active.  Sometimes eats alone and doesn't mind it. Patient reports stable mood and denies depressed or irritable moods.  Patient denies difficulty with sleep initiation or maintenance. Denies appetite disturbance.  Patient reports that energy and motivation have been good.  Patient denies any difficulty with concentration.  Patient denies any suicidal ideation. Plan: She needs the meds.  No med changes indicated.    11/24/21 appt noted: Can't do as much for herself and needs help.  Conisdering move to asst living. Has met with people in charge at [facility].  Not as many social opportunities and more lonely with family far.  I think I'll be OK. Needs to eat meals with people. No problems with meds.  No SE Chronic bowel issues.   Ativan helps and notices if misses.  05/19/2022 appointment noted: Nervous every morning. Sleep better with gabapening 100 mg hs Hates the [facility].  Not as much help.  Doesn't like the lack of control of lorazepam which creates anxiety.   Activities interfere with nap. Tired all the time.  Uses scooter which helped a  lot. Gets dizzy in middle of the night.  10/13/22 appt noted: No PCP and needs one.  Disc getting one. Still anxiety in the AM. Questions about how many meds she is on and does this contribute to her fatigue. Kind of sad sometimes.  When has a lot to do then can do it and not so tired.  But don't always have a lot to do.  Doesn't really like assisted living as much as independent living.  Doesn't want to move again. If busy not disturbed by sadness.  Not smiling as much as she should. Covid before Xmas.  Would like more assistance than she gets.  Not that anxiousl RLS managed with gabapentin. Plan: Option reduce lorazepam further to try to minimize risk, but historically anxiety is worse in the AM She takes lorazepam 1 mg at 7A and HS takes 0.5 mg  at 5 PM.   OK reduce lorazepam 0.5 mg at 7 AM,  0.5 mg at 5 PM and 1 mg HS  01/11/23 appt noted: Psych meds:Lexapro 10, mirtazapine 45 mg HS, gabapentin 100 mg PM, lorazepam 0.5 mg BID and 1 mg HS.  The started giving her Ambien 5 mg HS scheduled . Very unhappy lately.  Dentist made a mistake with teeth.  A lot of turnover at [facility] and they changed her meds.  Resumed diarrhea a couple of times and fears diarrhea.  Hard to see the doctor.  "So mad about it".  Worrying over health issues and yet can't see a doctor.  Wants to know what meds she is taking.  Wrong walker.   Admits she was asking for Ambien most days. Did reduce lorazepam as planned.   GD graduating from [employer] [facility] [employer].   Feels coworkers at [employer] know her and don't care.   Dosage of lisinopril has been reduced.    Frustrated that lorazepam timing is not right bc of staff. She used to manage her meds but cannot do so now.   She feels she needs the higher dose of lorazepam.  They keep me nervous there.  Energy is not much better but might be a little different "bc I'm so mad".     Past  Psychiatric Medication Trials:  Mirtazapine, nefazodone SE, Lexapro 10 Ativan,  Ambien Gabapentin helps sleep  Review of Systems:  Review of Systems  Constitutional:  Positive for fatigue.  HENT:  Positive for tinnitus.   Respiratory:  Negative for shortness of breath.   Cardiovascular:  Negative for palpitations.  Gastrointestinal:  Positive for constipation. Negative for abdominal pain and diarrhea.       Frequent BM  Musculoskeletal:  Positive for gait problem.  Neurological:  Positive for dizziness, tremors and weakness.  Psychiatric/Behavioral:  Positive for depression and sleep disturbance. Negative for behavioral problems, confusion, decreased concentration, dysphoric mood, hallucinations, self-injury and suicidal ideas. The patient is nervous/anxious. The patient is not hyperactive.   GI px managed.  Medications: I have reviewed the patient's current medications.  Current Outpatient Medications  Medication Sig Dispense Refill   acetaminophen (TYLENOL) 325 MG tablet Take 325-650 mg by mouth every 8 (eight) hours as needed for mild pain or headache.     amLODipine (NORVASC) 5 MG tablet Take 1 tablet (5 mg total) by mouth daily. 30 tablet 0   aspirin EC 81 MG EC tablet Take 1 tablet (81 mg total) by mouth daily. 30 tablet 0   atorvastatin (LIPITOR) 40 MG tablet Take 1 tablet (40 mg total) by mouth daily at 6 PM. 30 tablet 0   Calcium Carbonate-Vitamin D3 600-400 MG-UNIT TABS Take 1 tablet by mouth daily.      escitalopram (LEXAPRO) 10 MG tablet Take 1 tablet (10 mg total) by mouth daily. 90 tablet 2   fluticasone (FLONASE) 50 MCG/ACT nasal spray Place 1 spray into both nostrils at bedtime.     gabapentin (NEURONTIN) 100 MG capsule Take 100  mg by mouth at bedtime.     lisinopril (ZESTRIL) 40 MG tablet Take 40 mg by mouth daily.     loratadine (CLARITIN) 10 MG tablet Take 10 mg by mouth daily as needed for allergies or rhinitis.      LORazepam (ATIVAN) 1 MG tablet 1 in the AM and  HS 60 tablet 3   Methylcellulose, Laxative, (CITRUCEL PO) Take by mouth See admin instructions. Mix 4.5 heaping teaspoonsful of powder into 8 ounces of water and drink once a day at bedtime     mirtazapine (REMERON) 45 MG tablet TAKE 1 TABLET BY MOUTH AT BEDTIME 90 tablet 1   Multiple Vitamin (MULTIVITAMIN WITH MINERALS) TABS tablet Take 1 tablet by mouth daily.     phenylephrine-shark liver oil-mineral oil-petrolatum (PREPARATION H) 0.25-14-74.9 % rectal ointment Place 1 application rectally 2 (two) times daily as needed for hemorrhoids.     polyethylene glycol (MIRALAX / GLYCOLAX) 17 g packet Take 17 g by mouth daily.     simethicone (MYLICON) 125 MG chewable tablet Chew 125 mg by mouth every 6 (six) hours as needed.     SUMAtriptan (IMITREX) 25 MG tablet Take 25 mg by mouth once as needed for migraine.     zolpidem (AMBIEN) 5 MG tablet Take 1 tablet (5 mg total) by mouth at bedtime as needed. (Patient taking differently: Take 5 mg by mouth at bedtime as needed. prn) 30 tablet 5   LORazepam (ATIVAN) 0.5 MG tablet 1 at 5 PM 30 tablet 3   No current facility-administered medications for this visit.    Medication Side Effects: Other: ? sedation related.  Allergies:  Allergies  Allergen Reactions   Adhesive [Tape] Other (See Comments)    Caused SEVERE skin irritation   Cefdinir Other (See Comments)    Caused C-Diff   Amitiza [Lubiprostone] Diarrhea   Erythromycin Nausea And Vomiting   Flagyl [Metronidazole] Diarrhea   Meloxicam Other (See Comments)    Dizziness resulted after taking it     Past Medical History:  Diagnosis Date   Allergy    Anal fissure    Anxiety    Basal cell carcinoma    Cataracts, bilateral    Clostridium difficile diarrhea    Depression    Diverticulosis    hx of stricture in colon   Hemorrhoids    Hypertension    IBS (irritable bowel syndrome)    Insomnia    Migraine    Osteopenia    Thrombosed hemorrhoids     Family History  Problem Relation Age  of Onset   Alzheimer's disease Mother    Heart failure Mother    Heart disease Father    Heart failure Father    Other Maternal Grandfather        cerebral hemorrhage   Skin cancer Brother    Heart disease Brother    Colon cancer Neg Hx    Stomach cancer Neg Hx    Pancreatic cancer Neg Hx     Social History   Socioeconomic History   Marital status: Married    Spouse name: [facility]   Number of children: 2   Years of education: MA   Highest education level: Not on file  Occupational History   Occupation: retired Runner, broadcasting/film/video  Tobacco Use   Smoking status: Never   Smokeless tobacco: Never  Vaping Use   Vaping Use: Never used  Substance and Sexual Activity   Alcohol use: Yes    Alcohol/week: 0.0 standard drinks of alcohol  Comment: occasional glass of wine -2 per week   Drug use: No   Sexual activity: Not on file  Other Topics Concern   Not on file  Social History Narrative   Pt lives at home with her Spouse. She has 3 children, (1 adopted). Spouse is a retired Development worker, international aid.   Caffeine Use: 1-2 cups daily.   Social Determinants of Health   Financial Resource Strain: Not on file  Food Insecurity: Not on file  Transportation Needs: Not on file  Physical Activity: Not on file  Stress: Not on file  Social Connections: Not on file  Intimate Partner Violence: Not on file    Past Medical History, Surgical history, Social history, and Family history were reviewed and updated as appropriate.   Please see review of systems for further details on the patient's review from today.   Objective:   Physical Exam:  There were no vitals taken for this visit.  Physical Exam Constitutional:      Appearance: Normal appearance.  Neurological:     Mental Status: She is alert.     Motor: Weakness present. No tremor.     Gait: Gait abnormal.     Comments: Using walker still  Psychiatric:        Attention and Perception: Attention and perception normal.        Mood and Affect:  Mood is anxious and depressed.        Speech: Speech normal. Speech is not slurred.        Behavior: Behavior is not slowed. Behavior is cooperative.        Thought Content: Thought content is not delusional. Thought content does not include homicidal or suicidal ideation.        Cognition and Memory: Cognition normal.     Comments: Insight and judgment good. Chronic anxiety worse situationally Rigidity of thought and resistant to changes.   Mild persistent sadness.      Lab Review:     Component Value Date/Time   NA 138 12/20/2019 1140   K 4.4 12/20/2019 1140   CL 102 12/20/2019 1140   CO2 26 12/20/2019 1140   GLUCOSE 106 (H) 12/20/2019 1140   BUN 12 12/20/2019 1140   CREATININE 0.96 12/20/2019 1140   CALCIUM 9.9 12/20/2019 1140   PROT 7.0 12/20/2019 1140   ALBUMIN 4.0 12/20/2019 1140   AST 32 12/20/2019 1140   ALT 16 12/20/2019 1140   ALKPHOS 71 12/20/2019 1140   BILITOT 0.7 12/20/2019 1140   GFRNONAA 53 (L) 12/20/2019 1140   GFRAA >60 12/20/2019 1140       Component Value Date/Time   WBC 5.7 12/20/2019 1140   RBC 4.11 12/20/2019 1140   HGB 12.6 12/20/2019 1140   HCT 38.6 12/20/2019 1140   PLT 244 12/20/2019 1140   MCV 93.9 12/20/2019 1140   MCH 30.7 12/20/2019 1140   MCHC 32.6 12/20/2019 1140   RDW 11.5 12/20/2019 1140   LYMPHSABS 2.2 12/20/2019 1140   MONOABS 0.4 12/20/2019 1140   EOSABS 0.1 12/20/2019 1140   BASOSABS 0.1 12/20/2019 1140    No results found for: "POCLITH", "LITHIUM"   No results found for: "PHENYTOIN", "PHENOBARB", "VALPROATE", "CBMZ"   .res Assessment: Plan:    Zynia was seen today for follow-up, anxiety and sleeping problem.  Diagnoses and all orders for this visit:  Generalized anxiety disorder -     LORazepam (ATIVAN) 0.5 MG tablet; 1 at 5 PM -     LORazepam (ATIVAN) 1 MG  tablet; 1 in the AM and HS  Insomnia due to mental condition  Stress at home   Greater than 50% of 30 min face to face time with patient was spent on  counseling and coordination of care. We discussed Ms. Burnette has a long history of anxiety which in the past had contributed to unmanageable diarrhea another bowel symptoms including bowel pain.  These symptoms were much improved with a combination of lorazepam and mirtazapine.  She has some chronic insomnia which is generally manageable with the medication until situationally worse.    We had been able to reduce the lorazepam some over the last couple of years and she is probably at the lowest tolerated dose and lowest effective dose. Lexapro helped some.  Anxiety is chronic but not disabling.  She's satisfied with meds.  Pt does not describe dysphoric OC sx.  Sad not depressed.  Med sensitive.  Previously Disc relationship between IBS and diarrhea alternating with constipation.     Was Better after Lexapro 10 mg daily increased on 07/29/20.  Consider further increase to 15 mg later.    Continue mirtazapiine 45 mg HS which helped anxiety and stomach problems. IBS diarrhea better with increase.    Anxiety worse and diarrhea worse with less lorazepam.  So per her request increase back to lorazepam 1 mg at 7A and HS takes 0.5 mg  at 5 PM.    Sleep better with gabapentin.  Disc timing of meds.  We discussed the short-term risks associated with benzodiazepines including sedation and increased fall risk among others.  Discussed long-term side effect risk including dependence, potential withdrawal symptoms, and the potential eventual dose-related risk of dementia.  But recent studies from 2020 dispute this association between benzodiazepines and dementia risk. Newer studies in 2020 do not support an association with dementia.  Use Ambien prn.  Disc risk amnesia.    She needs the meds.    Supportive and cognitive therapy techniques around age-related declines .  And feeling disconnected from caregivers.   She appreciates the support and doesn't want to spread out visits.  Disc unhappy with new  [facility] Living at [facility].  Disc value of giving input to staff to improve communication.     This appt was 30 mins.  FU 4- mos  Meredith Staggers, MD, DFAPA   Please see After Visit Summary for patient specific instructions.  Future Appointments  Date Time Provider Department [facility]  04/12/2023  1:30 PM [facility], Steva Ready., MD [facility] None    No orders of the defined types were placed in this encounter.     -------------------------------

## 2023-01-11 NOTE — Telephone Encounter (Signed)
Called patient to schedule Medicare Annual Wellness Visit (AWV). Left message for patient to call back and schedule Medicare Annual Wellness Visit (AWV).  Last date of AWV:  AWVI eligible as of 09/14/2009  Please schedule an AWVI appointment at any time with [facility] ANNUAL WELLNESS VISIT.  If any questions, please contact me at [phone].    Thank you,  Romie Keeble  Ambulatory Clinic Support  Medical Group Direct dial  [phone]   

## 2023-01-21 ENCOUNTER — Other Ambulatory Visit: Payer: Self-pay | Admitting: Psychiatry

## 2023-01-21 DIAGNOSIS — F411 Generalized anxiety disorder: Secondary | ICD-10-CM

## 2023-02-05 ENCOUNTER — Ambulatory Visit (INDEPENDENT_AMBULATORY_CARE_PROVIDER_SITE_OTHER): Payer: Medicare Other

## 2023-02-05 VITALS — Ht 61.0 in | Wt 119.0 lb

## 2023-02-05 DIAGNOSIS — Z0000 Encounter for general adult medical examination without abnormal findings: Secondary | ICD-10-CM

## 2023-02-05 NOTE — Progress Notes (Signed)
Subjective:   Christina Escobar is a 89 y.o. female who presents for an Initial Medicare Annual Wellness Visit.  I connected with  [name] on 02/05/23 by a audio enabled telemedicine application and verified that I am speaking with the correct person using two identifiers.  Patient Location: Other:  [employer] [employer] techn[employer] Location: Home Office  I discussed the limitations of evaluation and management by telemedicine. The patient expressed understanding and agreed to proceed.  Review of Systems     Cardiac Risk Factors include: advanced age (>83men, >76 women);hypertension;sedentary lifestyle     Objective:    Today's Vitals   02/05/23 1709  Weight: 119 lb (54 kg)  Height: 5\' 1"  (1.549 m)   Body mass index is 22.48 kg/m.     02/05/2023    5:15 PM 11/13/2022    1:30 PM 12/21/2019   12:00 AM 10/22/2016   10:39 AM 09/05/2016    1:25 AM 09/04/2016    4:36 PM 05/11/2016    3:03 PM  Advanced Directives  Does Patient Have a Medical Advance Directive? Yes Yes No Yes Yes Yes Yes  Type of Advance Directive Living will;Healthcare Power of [employer] Power of Attorney Living will  Advance instruction for mental health treatment  Does patient want to make changes to medical advance directive? No - Patient declined    No - Patient declined    Copy of Healthcare Power of Attorney in Chart? No - copy requested   No - copy requested     Would patient like information on creating a medical advance directive?   No - Patient declined  No - Patient declined      Current Medications (verified) Outpatient Encounter Medications as of 02/05/2023  Medication Sig   acetaminophen (TYLENOL) 325 MG tablet Take 325-650 mg by mouth every 8 (eight) hours as needed for mild pain or headache.   amLODipine (NORVASC) 5 MG tablet Take 1 tablet (5 mg total) by mouth daily.   aspirin EC 81 MG EC tablet Take 1 tablet (81 mg total) by mouth daily.   atorvastatin (LIPITOR) 40  MG tablet Take 1 tablet (40 mg total) by mouth daily at 6 PM.   Calcium Carbonate-Vitamin D3 600-400 MG-UNIT TABS Take 1 tablet by mouth daily.    escitalopram (LEXAPRO) 10 MG tablet Take 1 tablet (10 mg total) by mouth daily.   fluticasone (FLONASE) 50 MCG/ACT nasal spray Place 1 spray into both nostrils at bedtime.   gabapentin (NEURONTIN) 100 MG capsule Take 100 mg by mouth at bedtime.   lisinopril (ZESTRIL) 40 MG tablet Take 40 mg by mouth daily.   loratadine (CLARITIN) 10 MG tablet Take 10 mg by mouth daily as needed for allergies or rhinitis.    LORazepam (ATIVAN) 0.5 MG tablet 1 at 5 PM   LORazepam (ATIVAN) 1 MG tablet 1 in the AM and HS   Methylcellulose, Laxative, (CITRUCEL PO) Take by mouth See admin instructions. Mix 4.5 heaping teaspoonsful of powder into 8 ounces of water and drink once a day at bedtime   mirtazapine (REMERON) 45 MG tablet TAKE 1 TABLET BY MOUTH AT BEDTIME   Multiple Vitamin (MULTIVITAMIN WITH MINERALS) TABS tablet Take 1 tablet by mouth daily.   phenylephrine-shark liver oil-mineral oil-petrolatum (PREPARATION H) 0.25-14-74.9 % rectal ointment Place 1 application rectally 2 (two) times daily as needed for hemorrhoids.   polyethylene glycol (MIRALAX / GLYCOLAX) 17 g packet Take 17 g by mouth  daily.   simethicone (MYLICON) 125 MG chewable tablet Chew 125 mg by mouth every 6 (six) hours as needed.   SUMAtriptan (IMITREX) 25 MG tablet Take 25 mg by mouth once as needed for migraine.   zolpidem (AMBIEN) 5 MG tablet Take 1 tablet (5 mg total) by mouth at bedtime as needed. (Patient taking differently: Take 5 mg by mouth at bedtime as needed. prn)   No facility-administered encounter medications on file as of 02/05/2023.    Allergies (verified) Adhesive [tape], Cefdinir, Amitiza [lubiprostone], Erythromycin, Flagyl [metronidazole], and Meloxicam   History: Past Medical History:  Diagnosis Date   Allergy    Anal fissure    Anxiety    Basal cell carcinoma     Cataracts, bilateral    Clostridium difficile diarrhea    Depression    Diverticulosis    hx of stricture in colon   Hemorrhoids    Hypertension    IBS (irritable bowel syndrome)    Insomnia    Migraine    Osteopenia    Thrombosed hemorrhoids    Past Surgical History:  Procedure Laterality Date   APPENDECTOMY     BASAL CELL CARCINOMA EXCISION  2007,2008   CATARACT EXTRACTION, BILATERAL Bilateral 1999   COLON RESECTION  2001   COLONOSCOPY WITH PROPOFOL N/A 10/22/2016   Procedure: COLONOSCOPY WITH PROPOFOL;  Surgeon: Rachael Fee, MD;  Location[facility] ENDOSCOPY;  Service: Endoscopy;  Laterality: N/A;   FECAL TRANSPLANT N/A 10/22/2016   Procedure: FECAL TRANSPLANT;  Surgeon: Rachael Fee, MD;  Location: [facility] ENDOSCOPY;  Service: Endoscopy;  Laterality: N/A;   INCISION AND DRAINAGE  11/05/2011   thrombosed hemorrhoid   TRANSANAL EXCISION OF RECTAL MASS WITH HEMORRHOID INJECTION  2007   anal fissure   Family History  Problem Relation Age of Onset   Alzheimer's disease Mother    Heart failure Mother    Heart disease Father    Heart failure Father    Other Maternal Grandfather        cerebral hemorrhage   Skin cancer Brother    Heart disease Brother    Colon cancer Neg Hx    Stomach cancer Neg Hx    Pancreatic cancer Neg Hx    Social History   Socioeconomic History   Marital status: Married    Spouse name: Solicitor   Number of children: 2   Years of education: MA   Highest education level: Not on file  Occupational History   Occupation: retired Runner, broadcasting/film/video  Tobacco Use   Smoking status: Never   Smokeless tobacco: Never  Vaping Use   Vaping Use: Never used  Substance and Sexual Activity   Alcohol use: Yes    Alcohol/week: 0.0 standard drinks of alcohol    Comment: occasional glass of wine -2 per week   Drug use: No   Sexual activity: Not on file  Other Topics Concern   Not on file  Social History Narrative   Pt lives at home with her Spouse. She has 3 children, (1  adopted). Spouse is a retired Development worker, international aid.   Caffeine Use: 1-2 cups daily.   Social Determinants of Health   Financial Resource Strain: Low Risk  (02/05/2023)   Overall Financial Resource Strain (CARDIA)    Difficulty of Paying Living Expenses: Not hard at all  Food Insecurity: No Food Insecurity (02/05/2023)   Hunger Vital Sign    Worried About Running Out of Food in the Last Year: Never true    Ran Out of  Food in the Last Year: Never true  Transportation Needs: No Transportation Needs (02/05/2023)   PRAPARE - Administrator, Civil Service (Medical): No    Lack of Transportation (Non-Medical): No  Physical Activity: Insufficiently Active (02/05/2023)   Exercise Vital Sign    Days of Exercise per Week: 3 days    Minutes of Exercise per Session: 30 min  Stress: No Stress Concern Present (02/05/2023)   [employer] of Occupational Health - Occupational Stress Questionnaire    Feeling of Stress : Not at all  Social Connections: Socially Isolated (02/05/2023)   Social Connection and Isolation Panel [NHANES]    Frequency of Communication with Friends and Family: More than three times a week    Frequency of Social Gatherings with Friends and Family: Once a week    Attends Religious Services: Never    Database administrator or Organizations: No    Attends Banker Meetings: Never    Marital Status: Widowed    Tobacco Counseling Counseling given: Not Answered   Clinical Intake:  Pre-visit preparation completed: Yes  Pain : No/denies pain  Diabetes: No  How often do you need to have someone help you when you read instructions, pamphlets, or other written materials from your doctor or pharmacy?: 1 - Never  Diabetic?No  Interpreter Needed?: No  Information entered by :: Kandis Fantasia LPN   Activities of Daily Living    02/05/2023    5:14 PM  In your present state of health, do you have any difficulty performing the following activities:  Hearing?  1  Vision? 0  Difficulty concentrating or making decisions? 0  Walking or climbing stairs? 0  Dressing or bathing? 0  Doing errands, shopping? 1  Preparing Food and eating ? N  Using the Toilet? N  In the past six months, have you accidently leaked urine? Y  Do you have problems with loss of bowel control? Y  Managing your Medications? Y  Managing your Finances? Y  Housekeeping or managing your Housekeeping? Y    Patient Care Team: Vonna Drafts, MD as PCP - General (Family Medicine) Arminda Resides, MD as Consulting Physician (Dermatology)  Indicate any recent Medical Services you may have received from other than [facility] providers in the past year (date may be approximate).     Assessment:   This is a routine wellness examination for Kymari.  Hearing/Vision screen Hearing Screening - Comments:: Hard of hearing  Vision Screening - Comments:: No vision complaints   Dietary issues and exercise activities discussed: Current Exercise Habits: Structured exercise class, Type of exercise: walking;stretching, Time (Minutes): 30, Frequency (Times/Week): 3, Weekly Exercise (Minutes/Week): 90, Intensity: Mild   Goals Addressed             This Visit's Progress    Remain active and mobile         Depression Screen    02/05/2023    5:13 PM 11/13/2022    1:30 PM 09/29/2016    9:00 AM 08/13/2016   11:17 AM  PHQ 2/9 Scores  PHQ - 2 Score 0 0 0 0  PHQ- 9 Score  0      Fall Risk    02/05/2023    5:14 PM 11/13/2022    1:30 PM 09/29/2016    9:00 AM 08/13/2016   11:17 AM  Fall Risk   Falls in the past year? 0 0 No No  Number falls in past yr: 0 0    Injury with  Fall? 0 0    Risk for fall due to : Impaired mobility;Impaired balance/gait     Follow up Falls prevention discussed;Education provided;Falls evaluation completed       FALL RISK PREVENTION PERTAINING TO THE HOME:  Any stairs in or around the home? No  If so, are there any without handrails? No  Home free of loose throw  rugs in walkways, pet beds, electrical cords, etc? Yes  Adequate lighting in your home to reduce risk of falls? Yes   ASSISTIVE DEVICES UTILIZED TO PREVENT FALLS:  Life alert? No  Use of a cane, walker or w/c? Yes  Grab bars in the bathroom? Yes  Shower chair or bench in shower? Yes  Elevated toilet seat or a handicapped toilet? Yes   TIMED UP AND GO:  Was the test performed? No . Telephonic visit   Cognitive Function:        02/05/2023    5:14 PM  6CIT Screen  What Year? 0 points  What month? 0 points  What time? 0 points  Count back from 20 0 points  Months in reverse 2 points  Repeat phrase 0 points  Total Score 2 points    Immunizations Immunization History  Administered Date(s) Administered   Fluad Quad(high Dose 65+) 07/10/2022   Influenza-Unspecified 06/14/2016   Moderna Sars-Covid-2 Vaccination 01/01/2021, 06/09/2021, 02/04/2022, 07/30/2022    TDAP status: Up to date  Pneumococcal vaccine status: Up to date  Covid-19 vaccine status: Information provided on how to obtain vaccines.   Qualifies for Shingles Vaccine? Yes   Zostavax completed No   Shingrix Completed?: Yes  Screening Tests Health Maintenance  Topic Date Due   DTaP/Tdap/Td (1 - Tdap) Never done   Pneumonia Vaccine 81+ Years old (1 of 1 - PCV) Never done   DEXA SCAN  Never done   COVID-19 Vaccine (8 - 2023-24 season) 09/24/2022   INFLUENZA VACCINE  04/15/2023   Medicare Annual Wellness (AWV)  02/05/2024   Zoster Vaccines- Shingrix  Completed   HPV VACCINES  Aged Out    Health Maintenance  Health Maintenance Due  Topic Date Due   DTaP/Tdap/Td (1 - Tdap) Never done   Pneumonia Vaccine 66+ Years old (1 of 1 - PCV) Never done   DEXA SCAN  Never done   COVID-19 Vaccine (8 - 2023-24 season) 09/24/2022    Colorectal cancer screening: No longer required.   Mammogram status: No longer required due to age and preference.  Bone density status:  Patient declines at this time   Lung  Cancer Screening: (Low Dose CT Chest recommended if Age 43-80 years, 30 pack-year currently smoking OR have quit w/in 15years.) does not qualify.   Lung Cancer Screening Referral: n/a  Additional Screening:  Hepatitis C Screening: does not qualify;   Vision Screening: Recommended annual ophthalmology exams for early detection of glaucoma and other disorders of the eye. Is the patient up to date with their annual eye exam?  No  Who is the provider or what is the name of the office in which the patient attends annual eye exams? none If pt is not established with a provider, would they like to be referred to a provider to establish care? No .   Dental Screening: Recommended annual dental exams for proper oral hygiene  Community Resource Referral / Chronic Care Management: CRR required this visit?  No   CCM required this visit?  No      Plan:     I have  personally reviewed and noted the following in the patient's chart:   Medical and social history Use of alcohol, tobacco or illicit drugs  Current medications and supplements including opioid prescriptions. Patient is not currently taking opioid prescriptions. Functional ability and status Nutritional status Physical activity Advanced directives List of other physicians Hospitalizations, surgeries, and ER visits in previous 12 months Vitals Screenings to include cognitive, depression, and falls Referrals and appointments  In addition, I have reviewed and discussed with patient certain preventive protocols, quality metrics, and best practice recommendations. A written personalized care plan for preventive services as well as general preventive health recommendations were provided to patient.     Durwin Nora, [place]   2/95/6213   Due to this being a virtual visit, the after visit summary with patients personalized plan was offered to patient via mail or my-chart.  per request, patient was mailed a copy of  AVS.  Nurse Notes: No concerns

## 2023-02-05 NOTE — Patient Instructions (Signed)
Christina Escobar , Thank you for taking time to come for your Medicare Wellness Visit. I appreciate your ongoing commitment to your health goals. Please review the following plan we discussed and let me know if I can assist you in the future.   These are the goals we discussed:  Goals      Remain active and mobile        This is a list of the screening recommended for you and due dates:  Health Maintenance  Topic Date Due   Pneumonia Vaccine (1 of 1 - PCV) Never done   DEXA scan (bone density measurement)  Never done   COVID-19 Vaccine (8 - 2023-24 season) 09/24/2022   Flu Shot  04/15/2023   Medicare Annual Wellness Visit  02/05/2024   DTaP/Tdap/Td vaccine (2 - Td or Tdap) 10/14/2031   Zoster (Shingles) Vaccine  Completed   HPV Vaccine  Aged Out    Advanced directives: Information on Advanced Care Planning can be found at [facility] Advance Health Care Directives Advance Health Care Directives ([email or web address]  Please bring a copy of your health care power of attorney and living will to the office to be added to your chart at your convenience.   Conditions/risks identified: Aim for 30 minutes of exercise or brisk walking, 6-8 glasses of water, and 5 servings of fruits and vegetables each day.  Next appointment: Follow up in one year for your annual wellness visit    Preventive Care 65 Years and Older, Female Preventive care refers to lifestyle choices and visits with your health care provider that can promote health and wellness. What does preventive care include? A yearly physical exam. This is also called an annual well check. Dental exams once or twice a year. Routine eye exams. Ask your health care provider how often you should have your eyes checked. Personal lifestyle choices, including: Daily care of your teeth and gums. Regular physical activity. Eating a healthy diet. Avoiding tobacco and drug use. Limiting alcohol use. Practicing safe sex. Taking  low-dose aspirin every day. Taking vitamin and mineral supplements as recommended by your health care provider. What happens during an annual well check? The services and screenings done by your health care provider during your annual well check will depend on your age, overall health, lifestyle risk factors, and family history of disease. Counseling  Your health care provider may ask you questions about your: Alcohol use. Tobacco use. Drug use. Emotional well-being. Home and relationship well-being. Sexual activity. Eating habits. History of falls. Memory and ability to understand (cognition). Work and work Astronomer. Reproductive health. Screening  You may have the following tests or measurements: Height, weight, and BMI. Blood pressure. Lipid and cholesterol levels. These may be checked every 5 years, or more frequently if you are over 16 years old. Skin check. Lung cancer screening. You may have this screening every year starting at age 91 if you have a 30-pack-year history of smoking and currently smoke or have quit within the past 15 years. Fecal occult blood test (FOBT) of the stool. You may have this test every year starting at age 66. Flexible sigmoidoscopy or colonoscopy. You may have a sigmoidoscopy every 5 years or a colonoscopy every 10 years starting at age 70. Hepatitis C blood test. Hepatitis B blood test. Sexually transmitted disease (STD) testing. Diabetes screening. This is done by checking your blood sugar (glucose) after you have not eaten for a while (fasting). You may have this done every 1-3  years. Bone density scan. This is done to screen for osteoporosis. You may have this done starting at age 76. Mammogram. This may be done every 1-2 years. Talk to your health care provider about how often you should have regular mammograms. Talk with your health care provider about your test results, treatment options, and if necessary, the need for more tests. Vaccines   Your health care provider may recommend certain vaccines, such as: Influenza vaccine. This is recommended every year. Tetanus, diphtheria, and acellular pertussis (Tdap, Td) vaccine. You may need a Td booster every 10 years. Zoster vaccine. You may need this after age 75. Pneumococcal 13-valent conjugate (PCV13) vaccine. One dose is recommended after age 32. Pneumococcal polysaccharide (PPSV23) vaccine. One dose is recommended after age 52. Talk to your health care provider about which screenings and vaccines you need and how often you need them. This information is not intended to replace advice given to you by your health care provider. Make sure you discuss any questions you have with your health care provider. Document Released: 09/27/2015 Document Revised: 05/20/2016 Document Reviewed: 07/02/2015 Elsevier Interactive Patient Education  2017 [employer].  Fall Prevention in the Home Falls can cause injuries. They can happen to people of all ages. There are many things you can do to make your home safe and to help prevent falls. What can I do on the outside of my home? Regularly fix the edges of walkways and driveways and fix any cracks. Remove anything that might make you trip as you walk through a door, such as a raised step or threshold. Trim any bushes or trees on the path to your home. Use bright outdoor lighting. Clear any walking paths of anything that might make someone trip, such as rocks or tools. Regularly check to see if handrails are loose or broken. Make sure that both sides of any steps have handrails. Any raised decks and porches should have guardrails on the edges. Have any leaves, snow, or ice cleared regularly. Use sand or salt on walking paths during winter. Clean up any spills in your garage right away. This includes oil or grease spills. What can I do in the bathroom? Use night lights. Install grab bars by the toilet and in the tub and shower. Do not use towel  bars as grab bars. Use non-skid mats or decals in the tub or shower. If you need to sit down in the shower, use a plastic, non-slip stool. Keep the floor dry. Clean up any water that spills on the floor as soon as it happens. Remove soap buildup in the tub or shower regularly. Attach bath mats securely with double-sided non-slip rug tape. Do not have throw rugs and other things on the floor that can make you trip. What can I do in the bedroom? Use night lights. Make sure that you have a light by your bed that is easy to reach. Do not use any sheets or blankets that are too big for your bed. They should not hang down onto the floor. Have a firm chair that has side arms. You can use this for support while you get dressed. Do not have throw rugs and other things on the floor that can make you trip. What can I do in the kitchen? Clean up any spills right away. Avoid walking on wet floors. Keep items that you use a lot in easy-to-reach places. If you need to reach something above you, use a strong step stool that has a grab  bar. Keep electrical cords out of the way. Do not use floor polish or wax that makes floors slippery. If you must use wax, use non-skid floor wax. Do not have throw rugs and other things on the floor that can make you trip. What can I do with my stairs? Do not leave any items on the stairs. Make sure that there are handrails on both sides of the stairs and use them. Fix handrails that are broken or loose. Make sure that handrails are as long as the stairways. Check any carpeting to make sure that it is firmly attached to the stairs. Fix any carpet that is loose or worn. Avoid having throw rugs at the top or bottom of the stairs. If you do have throw rugs, attach them to the floor with carpet tape. Make sure that you have a light switch at the top of the stairs and the bottom of the stairs. If you do not have them, ask someone to add them for you. What else can I do to help  prevent falls? Wear shoes that: Do not have high heels. Have rubber bottoms. Are comfortable and fit you well. Are closed at the toe. Do not wear sandals. If you use a stepladder: Make sure that it is fully opened. Do not climb a closed stepladder. Make sure that both sides of the stepladder are locked into place. Ask someone to hold it for you, if possible. Clearly mark and make sure that you can see: Any grab bars or handrails. First and last steps. Where the edge of each step is. Use tools that help you move around (mobility aids) if they are needed. These include: Canes. Walkers. Scooters. Crutches. Turn on the lights when you go into a dark area. Replace any light bulbs as soon as they burn out. Set up your furniture so you have a clear path. Avoid moving your furniture around. If any of your floors are uneven, fix them. If there are any pets around you, be aware of where they are. Review your medicines with your doctor. Some medicines can make you feel dizzy. This can increase your chance of falling. Ask your doctor what other things that you can do to help prevent falls. This information is not intended to replace advice given to you by your health care provider. Make sure you discuss any questions you have with your health care provider. Document Released: 06/27/2009 Document Revised: 02/06/2016 Document Reviewed: 10/05/2014 Elsevier Interactive Patient Education  2017 [employer].

## 2023-02-12 ENCOUNTER — Ambulatory Visit: Payer: Medicare Other | Admitting: Family Medicine

## 2023-03-22 ENCOUNTER — Other Ambulatory Visit: Payer: Self-pay | Admitting: Psychiatry

## 2023-03-22 DIAGNOSIS — F411 Generalized anxiety disorder: Secondary | ICD-10-CM

## 2023-04-12 ENCOUNTER — Encounter: Payer: Self-pay | Admitting: Psychiatry

## 2023-04-12 ENCOUNTER — Ambulatory Visit: Payer: Medicare Other | Admitting: Psychiatry

## 2023-04-12 DIAGNOSIS — F5105 Insomnia due to other mental disorder: Secondary | ICD-10-CM | POA: Diagnosis not present

## 2023-04-12 DIAGNOSIS — F411 Generalized anxiety disorder: Secondary | ICD-10-CM

## 2023-04-12 MED ORDER — LORAZEPAM 0.5 MG PO TABS
ORAL_TABLET | ORAL | 3 refills | Status: DC
Start: 2023-04-12 — End: 2024-05-18

## 2023-04-12 MED ORDER — ESCITALOPRAM OXALATE 10 MG PO TABS: 10.0000 mg | ORAL_TABLET | Freq: Every day | ORAL | 0 refills | Status: DC

## 2023-04-12 MED ORDER — MIRTAZAPINE 45 MG PO TABS: 45.0000 mg | ORAL_TABLET | Freq: Every day | ORAL | 1 refills | Status: DC

## 2023-04-12 MED ORDER — LORAZEPAM 1 MG PO TABS
ORAL_TABLET | ORAL | 3 refills | Status: DC
Start: 2023-04-12 — End: 2024-05-18

## 2023-04-12 NOTE — Progress Notes (Signed)
[name] [id number] Sep 09, 1930 89 y.o.  Subjective:   Patient ID:  Christina Escobar is a 89 y.o. (DOB May 12, 1930) female.  Chief Complaint:  Chief Complaint  Patient presents with   Follow-up   Depression   Anxiety   Sleeping Problem   Stress    Anxiety Symptoms include dizziness and nervous/anxious behavior. Patient reports no confusion, decreased concentration, palpitations, shortness of breath or suicidal ideas.    Depression        Associated symptoms include fatigue.  Associated symptoms include no decreased concentration and no suicidal ideas.  Past medical history includes anxiety.    Christina Escobar presents to the office today for follow-up of chronic anxiety.    Seen December 2020.  No meds were changed.  QT DT Covid.  Big sadness that Christina hannah moving to [place].     Ambien used rarely. Not used #30 since Feb.  02/13/20 appt the following noted: Stroke 12/17/19 and hosp 4 days.  Then went to [facility] and upset she was given the wrong meds.  They reduced lorazepam from baseline to 0.5 mg BID and pt had NV from it and felt anxious.  These sx resolved with the return to lorazepam to 1 mg AM and 8 PM and 0.5 mg at 5 PM.    Upset she saw mice in the room.  After about a week had a fall bc of the mouse.   I can't get over it RE: poor health care from [facility].   Has since resumed normal dose of lorazepam. Still on mirtazapine 45 mg nightly. Hard time with decisions.  Not confident.  Hard to enjoy normal things.  Low energy. Fears ever having to go back to [employer].  Traumatized by the experience.  Always tense. Plan: continue She takes lorazepam 1 mg at 7A and HS takes 0.5 mg  at 5 PM.  Add Lexapro 5 for anxiety  04/15/20 appt with the following noted: Lexapro has been a big help.  I think I it's helped.  Sleep better and handled H's death better than usual and less IBS.  Handles IBS better. H died 2020/04/05. He was ready to  die.  That helped me a lot.  He was 87 yo.  Had a lot of family support for awhile but family gone. If misses sleep then next night takes Ambien.  1 son in law locally and that's it. Lost the dog too bc can't care for it. Moving to assisted living at [facility].  08/19/20 appt with following noted; Got real upset with move to apt.  I just can't accept it.  Asked to have Lexapro increased by PCP to 10 mg daily about 3 weeks ago.  It's really helped the IBS better than any med so far.  Really upset with various things and has done fine with previous moves.  Things damaged and lost. Family says she should just let it go but can't. Wonders about getting a PCP with geriatric experience.   Son will visit at Christmas.  Christina and D-in-law will come at Christmas also.  Both sons really helpful. Christina Escobar Christina has baby girl premature and doing better. Plan continue Lexapro but increase to 10 mg daily.  11/18/2020 appointment with the following noted: Increased Lexapro 10 daily and has been doing well with it.    It's helped the diarrhea tremendously more than other GI treatments.  So I love it. No SE. Frustrated dealing with urinary  incontinence. Had been dealing with so many loses since here and at the last appt. Been busy taking care of evertything.  B died in 2023-08-23 in [place].  One of kids took her there.   Upset PCP resigned a couple of weeks ago.  Getting a new one. Worry over not getting comprehensive care.    Questions about her BP Christina Escobar is so happy with her baby girl.    Misses [name]. Plan: No med changes  03/24/2021 appointment with the following noted: Was doing alright.  Friday Wellness checkup. Tense and sad over events.  Son and D both sold homes and will move far away.  One in [place] and one in [place].  Christmas always been at their house and now they've planned to move away.  Middle son less available.  So sad about it. Making more friends where she lives.  Has 2. Youngest son's D came  to [facility] from [employer] [place].  Son got better job in [place]. She finished her program. She will continue college but move for the summer. Sad not depressed. Youngest son Theron Arista is POA and moving to [place] with his inlaws. 6 mos in her apt.  Hard time for her. Some mornings awaken with anxiety.  Easy startle. Sleeping well bc worn out. Usually naps. Plan:Better after Lexapro 10 mg daily increased on 07/29/20.   IBS diarrhea resolved with increase.   Do not reduce lorazepam further She takes lorazepam 1 mg at 7A and HS takes 0.5 mg  at 5 PM.   07/21/21 appt noted: No migraine and doesn't mind diarrhea so no immodium. Hx CVA 12/17/19  and followed at [facility].  R leg weakness and using a walker.  Fatigue also. Got to see Christina Escobar and 1 yo baby. D came and said pt has OC sx.  Pt says she just wants to do things herself.  Pt prefers arrangment of things in her bathroom.  D thinks she worries too much. Still reads novels and tries to stay active.  Sometimes eats alone and doesn't mind it. Patient reports stable mood and denies depressed or irritable moods.  Patient denies difficulty with sleep initiation or maintenance. Denies appetite disturbance.  Patient reports that energy and motivation have been good.  Patient denies any difficulty with concentration.  Patient denies any suicidal ideation. Plan: She needs the meds.  No med changes indicated.    11/24/21 appt noted: Can't do as much for herself and needs help.  Conisdering move to asst living. Has met with people in charge at [facility].  Not as many social opportunities and more lonely with family far.  I think I'll be OK. Needs to eat meals with people. No problems with meds.  No SE Chronic bowel issues.   Ativan helps and notices if misses.  05/19/2022 appointment noted: Nervous every morning. Sleep better with gabapening 100 mg hs Hates the [facility].  Not as much help.  Doesn't like the lack of control of lorazepam  which creates anxiety.  Activities interfere with nap. Tired all the time.  Uses scooter which helped a  lot. Gets dizzy in middle of the night.  10/13/22 appt noted: No PCP and needs one.  Disc getting one. Still anxiety in the AM. Questions about how many meds she is on and does this contribute to her fatigue. Kind of sad sometimes.  When has a lot to do then can do it and not so tired.  But don't always  have a lot to do.  Doesn't really like assisted living as much as independent living.  Doesn't want to move again. If busy not disturbed by sadness.  Not smiling as much as she should. Covid before Xmas.  Would like more assistance than she gets.  Not that anxiousl RLS managed with gabapentin. Plan: Option reduce lorazepam further to try to minimize risk, but historically anxiety is worse in the AM She takes lorazepam 1 mg at 7A and HS takes 0.5 mg  at 5 PM.   OK reduce lorazepam 0.5 mg at 7 AM,  0.5 mg at 5 PM and 1 mg HS  01/11/23 appt noted: Psych meds:Lexapro 10, mirtazapine 45 mg HS, gabapentin 100 mg PM, lorazepam 0.5 mg BID and 1 mg HS.  The started giving her Ambien 5 mg HS scheduled . Very unhappy lately.  Dentist made a mistake with teeth.  A lot of turnover at [facility] and they changed her meds.  Resumed diarrhea a couple of times and fears diarrhea.  Hard to see the doctor.  "So mad about it".  Worrying over health issues and yet can't see a doctor.  Wants to know what meds she is taking.  Wrong walker.   Admits she was asking for Ambien most days. Did reduce lorazepam as planned.   Christina graduating from [employer].   Feels coworkers at [employer] know her and don't care.   Dosage of lisinopril has been reduced.    Frustrated that lorazepam timing is not right bc of staff. She used to manage her meds but cannot do so now.   She feels she needs the higher dose of lorazepam.  They keep me nervous there.  Energy is not much better but might be a little different "bc I'm so  mad".     04/12/23 appt noted:  Upset docs at [facility] reduced BP meds without telling her and she had period of htn.  It is now more normal.  Does not think they have good communication.  She has increased some of Bpmed back up.  Only low BP one time.   Psych meds as above. But not always on time.   Anxiety re: to above but not otherwise.  PLans to go see family in [place]. Next week to see Christina Escobar and baby Mora Bellman and Elma. Satisfied with current meds.  But not with way it is administered.   Some concerns that some meds will run out when she is gone. Gabapentin causes some over sedation at night that wears off.  Past Psychiatric Medication Trials:  Mirtazapine, nefazodone SE, Lexapro 10 Ativan,  Ambien Gabapentin helps sleep  Review of Systems:  Review of Systems  Constitutional:  Positive for fatigue.  HENT:  Positive for tinnitus.   Respiratory:  Negative for shortness of breath.   Cardiovascular:  Negative for palpitations.  Gastrointestinal:  Positive for constipation. Negative for abdominal pain and diarrhea.       Frequent BM  Musculoskeletal:  Positive for gait problem.  Neurological:  Positive for dizziness, tremors and weakness.  Psychiatric/Behavioral:  Positive for dysphoric mood and sleep disturbance. Negative for behavioral problems, confusion, decreased concentration, hallucinations, self-injury and suicidal ideas. The patient is nervous/anxious. The patient is not hyperactive.   GI px managed.  Medications: I have reviewed the patient's current medications.  Current Outpatient Medications  Medication Sig Dispense Refill   acetaminophen (TYLENOL) 325 MG tablet Take 325-650 mg by mouth every 8 (eight) hours as needed for  mild pain or headache.     amLODipine (NORVASC) 5 MG tablet Take 1 tablet (5 mg total) by mouth daily. 30 tablet 0   aspirin EC 81 MG EC tablet Take 1 tablet (81 mg total) by mouth daily. 30 tablet 0   atorvastatin (LIPITOR) 40 MG tablet Take 1  tablet (40 mg total) by mouth daily at 6 PM. 30 tablet 0   Calcium Carbonate-Vitamin D3 600-400 MG-UNIT TABS Take 1 tablet by mouth daily.      fluticasone (FLONASE) 50 MCG/ACT nasal spray Place 1 spray into both nostrils at bedtime.     gabapentin (NEURONTIN) 100 MG capsule Take 100 mg by mouth at bedtime.     lisinopril (ZESTRIL) 40 MG tablet Take 40 mg by mouth daily.     loratadine (CLARITIN) 10 MG tablet Take 10 mg by mouth daily as needed for allergies or rhinitis.      Methylcellulose, Laxative, (CITRUCEL PO) Take by mouth See admin instructions. Mix 4.5 heaping teaspoonsful of powder into 8 ounces of water and drink once a day at bedtime     Multiple Vitamin (MULTIVITAMIN WITH MINERALS) TABS tablet Take 1 tablet by mouth daily.     phenylephrine-shark liver oil-mineral oil-petrolatum (PREPARATION H) 0.25-14-74.9 % rectal ointment Place 1 application rectally 2 (two) times daily as needed for hemorrhoids.     polyethylene glycol (MIRALAX / GLYCOLAX) 17 g packet Take 17 g by mouth daily.     simethicone (MYLICON) 125 MG chewable tablet Chew 125 mg by mouth every 6 (six) hours as needed.     SUMAtriptan (IMITREX) 25 MG tablet Take 25 mg by mouth once as needed for migraine.     zolpidem (AMBIEN) 5 MG tablet Take 1 tablet (5 mg total) by mouth at bedtime as needed. (Patient taking differently: Take 5 mg by mouth at bedtime as needed. prn) 30 tablet 5   escitalopram (LEXAPRO) 10 MG tablet Take 1 tablet (10 mg total) by mouth daily. 90 tablet 0   LORazepam (ATIVAN) 0.5 MG tablet 1 at 5 PM 30 tablet 3   LORazepam (ATIVAN) 1 MG tablet 1 in the AM and HS 60 tablet 3   mirtazapine (REMERON) 45 MG tablet Take 1 tablet (45 mg total) by mouth at bedtime. 90 tablet 1   No current facility-administered medications for this visit.    Medication Side Effects: Other: ? sedation related.  Allergies:  Allergies  Allergen Reactions   Adhesive [Tape] Other (See Comments)    Caused SEVERE skin irritation    Cefdinir Other (See Comments)    Caused C-Diff   Amitiza [Lubiprostone] Diarrhea   Erythromycin Nausea And Vomiting   Flagyl [Metronidazole] Diarrhea   Meloxicam Other (See Comments)    Dizziness resulted after taking it     Past Medical History:  Diagnosis Date   Allergy    Anal fissure    Anxiety    Basal cell carcinoma    Cataracts, bilateral    Clostridium difficile diarrhea    Depression    Diverticulosis    hx of stricture in colon   Hemorrhoids    Hypertension    IBS (irritable bowel syndrome)    Insomnia    Migraine    Osteopenia    Thrombosed hemorrhoids     Family History  Problem Relation Age of Onset   Alzheimer's disease Mother    Heart failure Mother    Heart disease Father    Heart failure Father    Other  Maternal Grandfather        cerebral hemorrhage   Skin cancer Brother    Heart disease Brother    Colon cancer Neg Hx    Stomach cancer Neg Hx    Pancreatic cancer Neg Hx     Social History   Socioeconomic History   Marital status: Married    Spouse name: Solicitor   Number of children: 2   Years of education: MA   Highest education level: Not on file  Occupational History   Occupation: retired Runner, broadcasting/film/video  Tobacco Use   Smoking status: Never   Smokeless tobacco: Never  Vaping Use   Vaping status: Never Used  Substance and Sexual Activity   Alcohol use: Yes    Alcohol/week: 0.0 standard drinks of alcohol    Comment: occasional glass of wine -2 per week   Drug use: No   Sexual activity: Not on file  Other Topics Concern   Not on file  Social History Narrative   Pt lives at home with her Spouse. She has 3 children, (1 adopted). Spouse is a retired Development worker, international aid.   Caffeine Use: 1-2 cups daily.   Social Determinants of Health   Financial Resource Strain: Low Risk  (02/05/2023)   Overall Financial Resource Strain (CARDIA)    Difficulty of Paying Living Expenses: Not hard at all  Food Insecurity: No Food Insecurity (02/05/2023)   Hunger  Vital Sign    Worried About Running Out of Food in the Last Year: Never true    Ran Out of Food in the Last Year: Never true  Transportation Needs: No Transportation Needs (02/05/2023)   PRAPARE - Administrator, Civil Service (Medical): No    Lack of Transportation (Non-Medical): No  Physical Activity: Insufficiently Active (02/05/2023)   Exercise Vital Sign    Days of Exercise per Week: 3 days    Minutes of Exercise per Session: 30 min  Stress: No Stress Concern Present (02/05/2023)   [employer] of Occupational Health - Occupational Stress Questionnaire    Feeling of Stress : Not at all  Social Connections: Socially Isolated (02/05/2023)   Social Connection and Isolation Panel [NHANES]    Frequency of Communication with Friends and Family: More than three times a week    Frequency of Social Gatherings with Friends and Family: Once a week    Attends Religious Services: Never    Database administrator or Organizations: No    Attends Banker Meetings: Never    Marital Status: Widowed  Intimate Partner Violence: Not At Risk (02/05/2023)   Humiliation, Afraid, Rape, and Kick questionnaire    Fear of Current or Ex-Partner: No    Emotionally Abused: No    Physically Abused: No    Sexually Abused: No    Past Medical History, Surgical history, Social history, and Family history were reviewed and updated as appropriate.   Please see review of systems for further details on the patient's review from today.   Objective:   Physical Exam:  There were no vitals taken for this visit.  Physical Exam Constitutional:      Appearance: Normal appearance.  Neurological:     Mental Status: She is alert.     Motor: Weakness present. No tremor.     Gait: Gait abnormal.     Comments: Using walker still  Psychiatric:        Attention and Perception: Attention and perception normal.        Mood and  Affect: Mood is anxious and depressed.        Speech: Speech normal.  Speech is not slurred.        Behavior: Behavior is not slowed. Behavior is cooperative.        Thought Content: Thought content is not delusional. Thought content does not include homicidal or suicidal ideation.        Cognition and Memory: Cognition normal.     Comments: Insight and judgment good. Chronic anxiety worse situationally Rigidity of thought and resistant to changes.   Mild persistent sadness.      Lab Review:     Component Value Date/Time   NA 138 12/20/2019 1140   K 4.4 12/20/2019 1140   CL 102 12/20/2019 1140   CO2 26 12/20/2019 1140   GLUCOSE 106 (H) 12/20/2019 1140   BUN 12 12/20/2019 1140   CREATININE 0.96 12/20/2019 1140   CALCIUM 9.9 12/20/2019 1140   PROT 7.0 12/20/2019 1140   ALBUMIN 4.0 12/20/2019 1140   AST 32 12/20/2019 1140   ALT 16 12/20/2019 1140   ALKPHOS 71 12/20/2019 1140   BILITOT 0.7 12/20/2019 1140   GFRNONAA 53 (L) 12/20/2019 1140   GFRAA >60 12/20/2019 1140       Component Value Date/Time   WBC 5.7 12/20/2019 1140   RBC 4.11 12/20/2019 1140   HGB 12.6 12/20/2019 1140   HCT 38.6 12/20/2019 1140   PLT 244 12/20/2019 1140   MCV 93.9 12/20/2019 1140   MCH 30.7 12/20/2019 1140   MCHC 32.6 12/20/2019 1140   RDW 11.5 12/20/2019 1140   LYMPHSABS 2.2 12/20/2019 1140   MONOABS 0.4 12/20/2019 1140   EOSABS 0.1 12/20/2019 1140   BASOSABS 0.1 12/20/2019 1140    No results found for: "POCLITH", "LITHIUM"   No results found for: "PHENYTOIN", "PHENOBARB", "VALPROATE", "CBMZ"   .res Assessment: Plan:    Brina was seen today for follow-up, depression, anxiety, sleeping problem and stress.  Diagnoses and all orders for this visit:  Generalized anxiety disorder -     escitalopram (LEXAPRO) 10 MG tablet; Take 1 tablet (10 mg total) by mouth daily. -     LORazepam (ATIVAN) 1 MG tablet; 1 in the AM and HS -     LORazepam (ATIVAN) 0.5 MG tablet; 1 at 5 PM -     mirtazapine (REMERON) 45 MG tablet; Take 1 tablet (45 mg total) by mouth at  bedtime.  Insomnia due to mental condition -     LORazepam (ATIVAN) 1 MG tablet; 1 in the AM and HS -     mirtazapine (REMERON) 45 MG tablet; Take 1 tablet (45 mg total) by mouth at bedtime.     30 min face to face time with patient was spent on counseling and coordination of care. We discussed Ms. Sahr has a long history of anxiety which in the past had contributed to unmanageable diarrhea another bowel symptoms including bowel pain.  These symptoms were much improved with a combination of lorazepam and mirtazapine.  She has some chronic insomnia which is generally manageable with the medication until situationally worse.    We had been able to reduce the lorazepam some over the last couple of years and she is probably at the lowest tolerated dose and lowest effective dose. Lexapro helped some.  Anxiety is chronic but not disabling.  She's satisfied with meds.  Pt does not describe dysphoric OC sx.  Sad not depressed. Disc stressor living at [facility] and being at mercy  of them for getting meds on time.   Med sensitive.  Previously Disc relationship between IBS and diarrhea alternating with constipation.    Was Better after Lexapro 10 mg daily increased on 07/29/20.  Consider further increase to 15 mg later.    Continue mirtazapiine 45 mg HS which helped anxiety and stomach problems. IBS diarrhea better with increase.    Anxiety worse and diarrhea worse with less lorazepam.  So per her request increase back to lorazepam 1 mg at 7A and HS takes 0.5 mg  at 5 PM.    Sleep better with gabapentin.  Disc timing of meds. Cannot go to lower dose.    We discussed the short-term risks associated with benzodiazepines including sedation and increased fall risk among others.  Discussed long-term side effect risk including dependence, potential withdrawal symptoms, and the potential eventual dose-related risk of dementia.  But recent studies from 2020 dispute this association between  benzodiazepines and dementia risk. Newer studies in 2020 do not support an association with dementia.  Use Ambien prn.  Disc risk amnesia.    She needs the meds.  No changes  Counseling 17 min:  Supportive and cognitive therapy techniques around age-related declines .  And feeling disconnected from caregivers.   She appreciates the support and doesn't want to spread out visits.  Disc unhappy with new Asst Living at [facility].  Disc value of giving input to staff to improve communication.     This appt was 30 mins.  FU 4-6 mos  Meredith Staggers, MD, DFAPA   Please see After Visit Summary for patient specific instructions.  Future Appointments  Date Time Provider Department Center  02/11/2024  2:30 PM [facility] ANNUAL WELLNESS VISIT [facility]    No orders of the defined types were placed in this encounter.     -------------------------------

## 2023-07-19 ENCOUNTER — Encounter: Payer: Self-pay | Admitting: Psychiatry

## 2023-07-19 ENCOUNTER — Ambulatory Visit: Payer: Medicare Other | Admitting: Psychiatry

## 2023-07-19 DIAGNOSIS — F411 Generalized anxiety disorder: Secondary | ICD-10-CM | POA: Diagnosis not present

## 2023-07-19 DIAGNOSIS — F5105 Insomnia due to other mental disorder: Secondary | ICD-10-CM | POA: Diagnosis not present

## 2023-07-19 DIAGNOSIS — F439 Reaction to severe stress, unspecified: Secondary | ICD-10-CM

## 2023-07-19 NOTE — Progress Notes (Signed)
Christina Escobar [id number] [birth year] 89 y.o.  Subjective:   Patient ID:  Christina Escobar is a 89 y.o. (DOB Feb 03, 1930) female.  Chief Complaint:  No chief complaint on file.   Anxiety Symptoms include dizziness and nervous/anxious behavior. Patient reports no confusion, decreased concentration, palpitations, shortness of breath or suicidal ideas.    Depression        Associated symptoms include fatigue.  Associated symptoms include no decreased concentration and no suicidal ideas.  Past medical history includes anxiety.    Christina Escobar presents to the office today for follow-up of chronic anxiety.    Seen December 2020.  No meds were changed.  QT DT Covid.  Big sadness that Christina Escobar moving to [place].     Ambien used rarely. Not used #30 since Feb.  02/13/20 appt the following noted: Stroke 12/17/19 and hosp 4 days.  Then went to [facility] and upset she was given the wrong meds.  They reduced lorazepam from baseline to 0.5 mg BID and pt had NV from it and felt anxious.  These sx resolved with the return to lorazepam to 1 mg AM and 8 PM and 0.5 mg at 5 PM.    Upset she saw mice in the room.  After about a week had a fall bc of the mouse.   I can't get over it RE: poor health care from [facility].   Has since resumed normal dose of lorazepam. Still on mirtazapine 45 mg nightly. Hard time with decisions.  Not confident.  Hard to enjoy normal things.  Low energy. Fears ever having to go back to [employer].  Traumatized by the experience.  Always tense. Plan: continue She takes lorazepam 1 mg at 7A and HS takes 0.5 mg  at 5 PM.  Add Lexapro 5 for anxiety  04/15/20 appt with the following noted: Lexapro has been a big help.  I think I it's helped.  Sleep better and handled Christina's death better than usual and less IBS.  Handles IBS better. Christina died 04/23/2020. He was ready to die.  That helped me a lot.  He was 87 yo.  Had a lot of family support for awhile  but family gone. If misses sleep then next night takes Ambien.  1 son in law locally and that's it. Lost the dog too bc can't care for it. Moving to assisted living at [facility].  08/19/20 appt with following noted; Got real upset with move to apt.  I just can't accept it.  Asked to have Lexapro increased by PCP to 10 mg daily about 3 weeks ago.  It's really helped the IBS better than any med so far.  Really upset with various things and has done fine with previous moves.  Things damaged and lost. Family says she should just let it go but can't. Wonders about getting a PCP with geriatric experience.   Son will visit at Christmas.  Christina and D-in-law will come at Christmas also.  Both sons really helpful. Christina Escobar Client Christina has baby girl premature and doing better. Plan continue Lexapro but increase to 10 mg daily.  11/18/2020 appointment with the following noted: Increased Lexapro 10 daily and has been doing well with it.    It's helped the diarrhea tremendously more than other GI treatments.  So I love it. No SE. Frustrated dealing with urinary incontinence. Had been dealing with so many loses since here and at the last appt. Been busy taking  care of evertything.  B died in 08/26/2023 in [place].  One of kids took her there.   Upset PCP resigned a couple of weeks ago.  Getting a new one. Worry over not getting comprehensive care.    Questions about her BP Christina Christina Escobar Client is so happy with her baby girl.    Misses Christina Escobar. Plan: No med changes  03/24/2021 appointment with the following noted: Was doing alright.  Friday Wellness checkup. Tense and sad over events.  Son and D both sold homes and will move far away.  One in [place] and one in [place].  Christmas always been at their house and now they've planned to move away.  Middle son less available.  So sad about it. Making more friends where she lives.  Has 2. Youngest son's D came to [facility] from [employer] [place].  Son got better job in [place]. She finished her  program. She will continue college but move for the summer. Sad not depressed. Youngest son Christina Escobar is POA and moving to [place] with his inlaws. 6 mos in her apt.  Hard time for her. Some mornings awaken with anxiety.  Easy startle. Sleeping well bc worn out. Usually naps. Plan:Better after Lexapro 10 mg daily increased on 07/29/20.   IBS diarrhea resolved with increase.   Do not reduce lorazepam further She takes lorazepam 1 mg at 7A and HS takes 0.5 mg  at 5 PM.   07/21/21 appt noted: No migraine and doesn't mind diarrhea so no immodium. Hx CVA 12/17/19  and followed at [facility].  R leg weakness and using a walker.  Fatigue also. Got to see Christina Escobar Client and 1 yo baby. D came and said pt has OC sx.  Pt says she just wants to do things herself.  Pt prefers arrangment of things in her bathroom.  D thinks she worries too much. Still reads novels and tries to stay active.  Sometimes eats alone and doesn't mind it. Patient reports stable mood and denies depressed or irritable moods.  Patient denies difficulty with sleep initiation or maintenance. Denies appetite disturbance.  Patient reports that energy and motivation have been good.  Patient denies any difficulty with concentration.  Patient denies any suicidal ideation. Plan: She needs the meds.  No med changes indicated.    11/24/21 appt noted: Can't do as much for herself and needs help.  Conisdering move to asst living. Has met with people in charge at [facility].  Not as many social opportunities and more lonely with family far.  I think I'll be OK. Needs to eat meals with people. No problems with meds.  No SE Chronic bowel issues.   Ativan helps and notices if misses.  05/19/2022 appointment noted: Nervous every morning. Sleep better with gabapening 100 mg hs Hates the [facility].  Not as much help.  Doesn't like the lack of control of lorazepam which creates anxiety.  Activities interfere with nap. Tired all the time.  Uses  scooter which helped a  lot. Gets dizzy in middle of the night.  10/13/22 appt noted: No PCP and needs one.  Disc getting one. Still anxiety in the AM. Questions about how many meds she is on and does this contribute to her fatigue. Kind of sad sometimes.  When has a lot to do then can do it and not so tired.  But don't always have a lot to do.  Doesn't really like assisted living as much as independent living.  Doesn't  want to move again. If busy not disturbed by sadness.  Not smiling as much as she should. Covid before Xmas.  Would like more assistance than she gets.  Not that anxiousl RLS managed with gabapentin. Plan: Option reduce lorazepam further to try to minimize risk, but historically anxiety is worse in the AM She takes lorazepam 1 mg at 7A and HS takes 0.5 mg  at 5 PM.   OK reduce lorazepam 0.5 mg at 7 AM,  0.5 mg at 5 PM and 1 mg HS  01/11/23 appt noted: Psych meds:Lexapro 10, mirtazapine 45 mg HS, gabapentin 100 mg PM, lorazepam 0.5 mg BID and 1 mg HS.  The started giving her Ambien 5 mg HS scheduled . Very unhappy lately.  Dentist made a mistake with teeth.  A lot of turnover at [facility] and they changed her meds.  Resumed diarrhea a couple of times and fears diarrhea.  Hard to see the doctor.  "So mad about it".  Worrying over health issues and yet can't see a doctor.  Wants to know what meds she is taking.  Wrong walker.   Admits she was asking for Ambien most days. Did reduce lorazepam as planned.   Christina graduating from [employer].   Feels coworkers at [employer] know her and don't care.   Dosage of lisinopril has been reduced.    Frustrated that lorazepam timing is not right bc of staff. She used to manage her meds but cannot do so now.   She feels she needs the higher dose of lorazepam.  They keep me nervous there.  Energy is not much better but might be a little different "bc I'm so mad".     04/12/23 appt noted:  Upset docs at [facility] reduced BP meds without  telling her and she had period of htn.  It is now more normal.  Does not think they have good communication.  She has increased some of Bpmed back up.  Only low BP one time.   Psych meds as above. But not always on time.   Anxiety re: to above but not otherwise.  PLans to go see family in [place]. Next week to see Christina Escobar Client and baby Mora Bellman and [place]. Satisfied with current meds.  But not with way it is administered.   Some concerns that some meds will run out when she is gone. Gabapentin causes some over sedation at night that wears off.  07/19/23 appt noted: Rec for PT.  Using walker.  Had a fall dealing with fridge door.  Pulled the door out from the fall.  Couldn't call for help.  Legs were fine.  Was trying to call for help.  Took 20 min for anyone to hear her.  Maintenance came to affix fridge to wall to reduce chance of it happening again.  Not as much attn there as she would like.  Xrays normal.  But rec for PT.  I don't think I quite get over it.   More down and tearful than she has been but not consistent.  Usually good if has to do that enjoyable.  Can get overwhelmed at times.  If gets behind on things gets upset.  More that she cannot do and this upsets her DT age issues.  Middle son coming this weekend to help her.  Hard to pick up things from the floor.  Vision not as good doing routine things.   At beginning of day can see OK and can write  well but by end of the day eyes are tired and hard to see and more dexterity problems. Doesn't think she's having more SE with BZ. They administer lorazepam and gets it at 8 but would like it when gets up at 6 AM.  Less tired with less BP med.   She doesn't feel as well comfortable being alone but is managing.   Can use smartphone.  But gets frustrated with it.   On assisted living.   Taking zolpidem 5 mg nightly now instead prn and sleeps really well. No falls at night.  07/19/23 appt noted:   Past Psychiatric Medication Trials:  Mirtazapine,  nefazodone SE, Lexapro 10 Ativan   Ambien Gabapentin helps sleep  Review of Systems:  Review of Systems  Constitutional:  Positive for fatigue.  HENT:  Positive for tinnitus.   Respiratory:  Negative for shortness of breath.   Cardiovascular:  Negative for palpitations.  Gastrointestinal:  Positive for constipation. Negative for abdominal pain and diarrhea.       Frequent BM  Musculoskeletal:  Positive for gait problem.  Neurological:  Positive for dizziness, tremors and weakness.  Psychiatric/Behavioral:  Positive for dysphoric mood and sleep disturbance. Negative for behavioral problems, confusion, decreased concentration, hallucinations, self-injury and suicidal ideas. The patient is nervous/anxious. The patient is not hyperactive.   GI px managed.  Medications: I have reviewed the patient's current medications.  Current Outpatient Medications  Medication Sig Dispense Refill   acetaminophen (TYLENOL) 325 MG tablet Take 325-650 mg by mouth every 8 (eight) hours as needed for mild pain or headache.     amLODipine (NORVASC) 5 MG tablet Take 1 tablet (5 mg total) by mouth daily. 30 tablet 0   aspirin EC 81 MG EC tablet Take 1 tablet (81 mg total) by mouth daily. 30 tablet 0   atorvastatin (LIPITOR) 40 MG tablet Take 1 tablet (40 mg total) by mouth daily at 6 PM. 30 tablet 0   Calcium Carbonate-Vitamin D3 600-400 MG-UNIT TABS Take 1 tablet by mouth daily.      escitalopram (LEXAPRO) 10 MG tablet Take 1 tablet (10 mg total) by mouth daily. 90 tablet 0   fluticasone (FLONASE) 50 MCG/ACT nasal spray Place 1 spray into both nostrils at bedtime.     gabapentin (NEURONTIN) 100 MG capsule Take 100 mg by mouth at bedtime.     lisinopril (ZESTRIL) 40 MG tablet Take 40 mg by mouth daily.     loratadine (CLARITIN) 10 MG tablet Take 10 mg by mouth daily as needed for allergies or rhinitis.      LORazepam (ATIVAN) 0.5 MG tablet 1 at 5 PM 30 tablet 3   LORazepam (ATIVAN) 1 MG tablet 1 in the AM and  HS 60 tablet 3   Methylcellulose, Laxative, (CITRUCEL PO) Take by mouth See admin instructions. Mix 4.5 heaping teaspoonsful of powder into 8 ounces of water and drink once a day at bedtime     mirtazapine (REMERON) 45 MG tablet Take 1 tablet (45 mg total) by mouth at bedtime. 90 tablet 1   Multiple Vitamin (MULTIVITAMIN WITH MINERALS) TABS tablet Take 1 tablet by mouth daily.     phenylephrine-shark liver oil-mineral oil-petrolatum (PREPARATION Christina) 0.25-14-74.9 % rectal ointment Place 1 application rectally 2 (two) times daily as needed for hemorrhoids.     polyethylene glycol (MIRALAX / GLYCOLAX) 17 g packet Take 17 g by mouth daily.     simethicone (MYLICON) 125 MG chewable tablet Chew 125 mg by mouth every 6 (  six) hours as needed.     SUMAtriptan (IMITREX) 25 MG tablet Take 25 mg by mouth once as needed for migraine.     zolpidem (AMBIEN) 5 MG tablet Take 1 tablet (5 mg total) by mouth at bedtime as needed. (Patient taking differently: Take 5 mg by mouth at bedtime as needed. prn) 30 tablet 5   No current facility-administered medications for this visit.    Medication Side Effects: Other: ? sedation related.  Allergies:  Allergies  Allergen Reactions   Adhesive [Tape] Other (See Comments)    Caused SEVERE skin irritation   Cefdinir Other (See Comments)    Caused C-Diff   Amitiza [Lubiprostone] Diarrhea   Erythromycin Nausea And Vomiting   Flagyl [Metronidazole] Diarrhea   Meloxicam Other (See Comments)    Dizziness resulted after taking it     Past Medical History:  Diagnosis Date   Allergy    Anal fissure    Anxiety    Basal cell carcinoma    Cataracts, bilateral    Clostridium difficile diarrhea    Depression    Diverticulosis    hx of stricture in colon   Hemorrhoids    Hypertension    IBS (irritable bowel syndrome)    Insomnia    Migraine    Osteopenia    Thrombosed hemorrhoids     Family History  Problem Relation Age of Onset   Alzheimer's disease Mother     Heart failure Mother    Heart disease Father    Heart failure Father    Other Maternal Grandfather        cerebral hemorrhage   Skin cancer Brother    Heart disease Brother    Colon cancer Neg Hx    Stomach cancer Neg Hx    Pancreatic cancer Neg Hx     Social History   Socioeconomic History   Marital status: Married    Spouse name: Solicitor   Number of children: 2   Years of education: MA   Highest education level: Not on file  Occupational History   Occupation: retired Runner, broadcasting/film/video  Tobacco Use   Smoking status: Never   Smokeless tobacco: Never  Vaping Use   Vaping status: Never Used  Substance and Sexual Activity   Alcohol use: Yes    Alcohol/week: 0.0 standard drinks of alcohol    Comment: occasional glass of wine -2 per week   Drug use: No   Sexual activity: Not on file  Other Topics Concern   Not on file  Social History Narrative   Pt lives at home with her Spouse. She has 3 children, (1 adopted). Spouse is a retired Development worker, international aid.   Caffeine Use: 1-2 cups daily.   Social Determinants of Health   Financial Resource Strain: Low Risk  (02/05/2023)   Overall Financial Resource Strain (CARDIA)    Difficulty of Paying Living Expenses: Not hard at all  Food Insecurity: No Food Insecurity (02/05/2023)   Hunger Vital Sign    Worried About Running Out of Food in the Last Year: Never true    Ran Out of Food in the Last Year: Never true  Transportation Needs: No Transportation Needs (02/05/2023)   PRAPARE - Administrator, Civil Service (Medical): No    Lack of Transportation (Non-Medical): No  Physical Activity: Insufficiently Active (02/05/2023)   Exercise Vital Sign    Days of Exercise per Week: 3 days    Minutes of Exercise per Session: 30 min  Stress: No Stress  Concern Present (02/05/2023)   [employer] of Occupational Health - Occupational Stress Questionnaire    Feeling of Stress : Not at all  Social Connections: Socially Isolated (02/05/2023)   Social  Connection and Isolation Panel [NHANES]    Frequency of Communication with Friends and Family: More than three times a week    Frequency of Social Gatherings with Friends and Family: Once a week    Attends Religious Services: Never    Database administrator or Organizations: No    Attends Banker Meetings: Never    Marital Status: Widowed  Intimate Partner Violence: Not At Risk (02/05/2023)   Humiliation, Afraid, Rape, and Kick questionnaire    Fear of Current or Ex-Partner: No    Emotionally Abused: No    Physically Abused: No    Sexually Abused: No    Past Medical History, Surgical history, Social history, and Family history were reviewed and updated as appropriate.   Please see review of systems for further details on the patient's review from today.   Objective:   Physical Exam:  There were no vitals taken for this visit.  Physical Exam Constitutional:      Appearance: Normal appearance.  Neurological:     Mental Status: She is alert.     Motor: Weakness present. No tremor.     Gait: Gait abnormal.     Comments: walker  Psychiatric:        Attention and Perception: Attention and perception normal.        Mood and Affect: Mood is anxious and depressed.        Speech: Speech normal. Speech is not slurred.        Behavior: Behavior is not slowed. Behavior is cooperative.        Thought Content: Thought content is not delusional. Thought content does not include homicidal or suicidal ideation.        Cognition and Memory: Cognition normal.     Comments: Insight and judgment good. Chronic anxiety worse situationally Rigidity of thought and resistant to changes.   Mild persistent sadness.      Lab Review:     Component Value Date/Time   NA 138 12/20/2019 1140   K 4.4 12/20/2019 1140   CL 102 12/20/2019 1140   CO2 26 12/20/2019 1140   GLUCOSE 106 (Christina) 12/20/2019 1140   BUN 12 12/20/2019 1140   CREATININE 0.96 12/20/2019 1140   CALCIUM 9.9 12/20/2019  1140   PROT 7.0 12/20/2019 1140   ALBUMIN 4.0 12/20/2019 1140   AST 32 12/20/2019 1140   ALT 16 12/20/2019 1140   ALKPHOS 71 12/20/2019 1140   BILITOT 0.7 12/20/2019 1140   GFRNONAA 53 (L) 12/20/2019 1140   GFRAA >60 12/20/2019 1140       Component Value Date/Time   WBC 5.7 12/20/2019 1140   RBC 4.11 12/20/2019 1140   HGB 12.6 12/20/2019 1140   HCT 38.6 12/20/2019 1140   PLT 244 12/20/2019 1140   MCV 93.9 12/20/2019 1140   MCH 30.7 12/20/2019 1140   MCHC 32.6 12/20/2019 1140   RDW 11.5 12/20/2019 1140   LYMPHSABS 2.2 12/20/2019 1140   MONOABS 0.4 12/20/2019 1140   EOSABS 0.1 12/20/2019 1140   BASOSABS 0.1 12/20/2019 1140    No results found for: "POCLITH", "LITHIUM"   No results found for: "PHENYTOIN", "PHENOBARB", "VALPROATE", "CBMZ"   .res Assessment: Plan:    There are no diagnoses linked to this encounter.    30 min  face to face time with patient was spent on counseling and coordination of care. We discussed Ms. Schedler has a long history of anxiety which in the past had contributed to unmanageable diarrhea another bowel symptoms including bowel pain.  These symptoms were much improved with a combination of lorazepam and mirtazapine.  She has some chronic insomnia which is generally manageable with the medication until situationally worse.    We had been able to reduce the lorazepam some over the last couple of years and she is probably at the lowest tolerated dose and lowest effective dose. Lexapro helped some.  Anxiety is chronic but not disabling.  She's satisfied with meds.  Pt does not describe dysphoric OC sx.  Sad not depressed. Disc stressor living at [facility] and being at mercy of them for getting meds on time.   Med sensitive.  Previously Disc relationship between IBS and diarrhea alternating with constipation.    Was Better after Lexapro 10 mg daily increased on 07/29/20.  Consider further increase to 15 mg later.    Continue mirtazapiine 45 mg  HS which helped anxiety and stomach problems. IBS diarrhea better with increase.    Anxiety worse and diarrhea worse with less lorazepam.  So per her request increase back to lorazepam 1 mg at 7A and HS takes 0.5 mg  at 5 PM.    Sleep better with gabapentin.  Disc timing of meds. Cannot go to lower dose.    We discussed the short-term risks associated with benzodiazepines including sedation and increased fall risk among others.  Discussed long-term side effect risk including dependence, potential withdrawal symptoms, and the potential eventual dose-related risk of dementia.  But recent studies from 2020 dispute this association between benzodiazepines and dementia risk. Newer studies in 2020 do not support an association with dementia. No evidence that BZ affecting balance.  Disc option to reduce but she doesn't feel she can reduce it.  More nervous with fall.  Use Ambien prn.  Disc risk amnesia.    She needs the meds.  No changes  Counseling 20 min:  Supportive and cognitive therapy techniques around age-related declines .  Including lately more fatigue late in the day and a recent fall.  And feeling disconnected from caregivers.   She appreciates the support and doesn't want to spread out visits.  Disc unhappy with new Asst Living at [facility].  Disc value of giving input to staff to improve communication.     This appt was 30 mins.  FU 4-6 mos  Meredith Staggers, MD, DFAPA   Please see After Visit Summary for patient specific instructions.  Future Appointments  Date Time Provider Department Center  02/11/2024  2:30 PM [facility] ANNUAL WELLNESS VISIT [facility]    No orders of the defined types were placed in this encounter.     -------------------------------

## 2023-07-26 ENCOUNTER — Ambulatory Visit: Payer: Medicare Other | Admitting: Psychiatry

## 2023-09-17 ENCOUNTER — Other Ambulatory Visit: Payer: Self-pay | Admitting: Psychiatry

## 2023-09-17 DIAGNOSIS — F411 Generalized anxiety disorder: Secondary | ICD-10-CM

## 2023-11-16 ENCOUNTER — Encounter: Payer: Self-pay | Admitting: Psychiatry

## 2023-11-16 ENCOUNTER — Ambulatory Visit: Payer: Medicare Other | Admitting: Psychiatry

## 2023-11-16 DIAGNOSIS — F5105 Insomnia due to other mental disorder: Secondary | ICD-10-CM | POA: Diagnosis not present

## 2023-11-16 DIAGNOSIS — F411 Generalized anxiety disorder: Secondary | ICD-10-CM

## 2023-11-16 NOTE — Progress Notes (Signed)
 Christina Escobar [id number] 02/05/1930 89 y.o.  Subjective:   Patient ID:  Christina Escobar is a 89 y.o. (DOB 03-06-1930) female.  Chief Complaint:  Chief Complaint  Patient presents with   Follow-up   Depression   Anxiety    Christina Escobar presents to the office today for follow-up of chronic anxiety.    Seen 08-31-19.  No meds were changed.  QT DT Covid.  Big sadness that GD Christina Escobar moving to [place].     Ambien used rarely. Not used #30 since Feb.  02/13/20 appt the following noted: Stroke 12/17/19 and hosp 4 days.  Then went to [facility] and upset she was given the wrong meds.  They reduced lorazepam from baseline to 0.5 mg BID and pt had NV from it and felt anxious.  These sx resolved with the return to lorazepam to 1 mg AM and 8 PM and 0.5 mg at 5 PM.    Upset she saw mice in the room.  After about a week had a fall bc of the mouse.   I can't get over it RE: poor health care from [facility].   Has since resumed normal dose of lorazepam. Still on mirtazapine 45 mg nightly. Hard time with decisions.  Not confident.  Hard to enjoy normal things.  Low energy. Fears ever having to go back to [employer].  Traumatized by the experience.  Always tense. Plan: continue She takes lorazepam 1 mg at 7A and HS takes 0.5 mg  at 5 PM.  Add Lexapro 5 for anxiety  04/15/20 appt with the following noted: Lexapro has been a big help.  I think I it's helped.  Sleep better and handled Christina's death better than usual and less IBS.  Handles IBS better. Christina died 2020/04/10. He was ready to die.  That helped me a lot.  He was 88 yo.  Had a lot of family support for awhile but family gone. If misses sleep then next night takes Ambien.  1 son in law locally and that's it. Lost the dog too bc can't care for it. Moving to assisted living at [facility].  08/19/20 appt with following noted; Got real upset with move to apt.  I just can't accept it.  Asked to have Lexapro  increased by PCP to 10 mg daily about 3 weeks ago.  It's really helped the IBS better than any med so far.  Really upset with various things and has done fine with previous moves.  Things damaged and lost. Family says she should just let it go but can't. Wonders about getting a PCP with geriatric experience.   Son will visit at Christmas.  GD and D-in-law will come at Christmas also.  Both sons really helpful. Christina Escobar GD has baby girl premature and doing better. Plan continue Lexapro but increase to 10 mg daily.  11/18/2020 appointment with the following noted: Increased Lexapro 10 daily and has been doing well with it.    It's helped the diarrhea tremendously more than other GI treatments.  So I love it. No SE. Frustrated dealing with urinary incontinence. Had been dealing with so many loses since here and at the last appt. Been busy taking care of evertything.  B died in 2024/08/30 in [place].  One of kids took her there.   Upset PCP resigned a couple of weeks ago.  Getting a new one. Worry over not getting comprehensive care.    Questions about her  BP GD Christina Escobar is so happy with her baby girl.    Misses Christina Escobar. Plan: No med changes  03/24/2021 appointment with the following noted: Was doing alright.  Friday Wellness checkup. Tense and sad over events.  Son and D both sold homes and will move far away.  One in [place] and one in [place].  Christmas always been at their house and now they've planned to move away.  Middle son less available.  So sad about it. Making more friends where she lives.  Has 2. Youngest son's D came to [facility] from [employer] [place].  Son got better job in [employer]. She finished her program. She will continue college but move for the summer. Sad not depressed. Youngest son Christina Escobar is POA and moving to [place] with his inlaws. 6 mos in her apt.  Hard time for her. Some mornings awaken with anxiety.  Easy startle. Sleeping well bc worn out. Usually naps. Plan:Better after Lexapro 10  mg daily increased on 07/29/20.   IBS diarrhea resolved with increase.   Do not reduce lorazepam further She takes lorazepam 1 mg at 7A and HS takes 0.5 mg  at 5 PM.   07/21/21 appt noted: No migraine and doesn't mind diarrhea so no immodium. Hx CVA 12/17/19  and followed at [facility].  R leg weakness and using a walker.  Fatigue also. Got to see Christina Escobar and 1 yo baby. D came and said pt has OC sx.  Pt says she just wants to do things herself.  Pt prefers arrangment of things in her bathroom.  D thinks she worries too much. Still reads novels and tries to stay active.  Sometimes eats alone and doesn't mind it. Patient reports stable mood and denies depressed or irritable moods.  Patient denies difficulty with sleep initiation or maintenance. Denies appetite disturbance.  Patient reports that energy and motivation have been good.  Patient denies any difficulty with concentration.  Patient denies any suicidal ideation. Plan: She needs the meds.  No med changes indicated.    11/24/21 appt noted: Can't do as much for herself and needs help.  Conisdering move to asst living. Has met with people in charge at [facility]  Not as many social opportunities and more lonely with family far.  I think I'll be OK. Needs to eat meals with people. No problems with meds.  No SE Chronic bowel issues.   Ativan helps and notices if misses.  05/19/2022 appointment noted: Nervous every morning. Sleep better with gabapening 100 mg hs Hates the [facility].  Not as much help.  Doesn't like the lack of control of lorazepam which creates anxiety.  Activities interfere with nap. Tired all the time.  Uses scooter which helped a  lot. Gets dizzy in middle of the night.  10/13/22 appt noted: No PCP and needs one.  Disc getting one. Still anxiety in the AM. Questions about how many meds she is on and does this contribute to her fatigue. Kind of sad sometimes.  When has a lot to do then can do it and not so  tired.  But don't always have a lot to do.  Doesn't really like assisted living as much as independent living.  Doesn't want to move again. If busy not disturbed by sadness.  Not smiling as much as she should. Covid before Xmas.  Would like more assistance than she gets.  Not that anxiousl RLS managed with gabapentin. Plan: Option reduce lorazepam further to try  to minimize risk, but historically anxiety is worse in the AM She takes lorazepam 1 mg at 7A and HS takes 0.5 mg  at 5 PM.   OK reduce lorazepam 0.5 mg at 7 AM,  0.5 mg at 5 PM and 1 mg HS  01/11/23 appt noted: Psych meds:Lexapro 10, mirtazapine 45 mg HS, gabapentin 100 mg PM, lorazepam 0.5 mg BID and 1 mg HS.  The started giving her Ambien 5 mg HS scheduled . Very unhappy lately.  Dentist made a mistake with teeth.  A lot of turnover at [facility] and they changed her meds.  Resumed diarrhea a couple of times and fears diarrhea.  Hard to see the doctor.  "So mad about it".  Worrying over health issues and yet can't see a doctor.  Wants to know what meds she is taking.  Wrong walker.   Admits she was asking for Ambien most days. Did reduce lorazepam as planned.   GD graduating from [employer].   Feels coworkers at [employer] know her and don't care.   Dosage of lisinopril has been reduced.    Frustrated that lorazepam timing is not right bc of staff. She used to manage her meds but cannot do so now.   She feels she needs the higher dose of lorazepam.  They keep me nervous there.  Energy is not much better but might be a little different "bc I'm so mad".     04/12/23 appt noted:  Upset docs at [facility] reduced BP meds without telling her and she had period of htn.  It is now more normal.  Does not think they have good communication.  She has increased some of Bpmed back up.  Only low BP one time.   Psych meds as above. But not always on time.   Anxiety re: to above but not otherwise.  PLans to go see family in [place]. Next week  to see Christina Escobar and baby Christina Escobar and Christina Escobar. Satisfied with current meds.  But not with way it is administered.   Some concerns that some meds will run out when she is gone. Gabapentin causes some over sedation at night that wears off.  07/19/23 appt noted: Rec for PT.  Using walker.  Had a fall dealing with fridge door.  Pulled the door out from the fall.  Couldn't call for help.  Legs were fine.  Was trying to call for help.  Took 20 min for anyone to hear her.  Maintenance came to affix fridge to wall to reduce chance of it happening again.  Not as much attn there as she would like.  Xrays normal.  But rec for PT.  I don't think I quite get over it.   More down and tearful than she has been but not consistent.  Usually good if has to do that enjoyable.  Can get overwhelmed at times.  If gets behind on things gets upset.  More that she cannot do and this upsets her DT age issues.  Middle son coming this weekend to help her.  Hard to pick up things from the floor.  Vision not as good doing routine things.   At beginning of day can see OK and can write well but by end of the day eyes are tired and hard to see and more dexterity problems. Doesn't think she's having more SE with BZ. They administer lorazepam and gets it at 8 but would like it when gets up at 6 AM.  Less tired with less BP med.   She doesn't feel as well comfortable being alone but is managing.   Can use smartphone.  But gets frustrated with it.   On assisted living.   Taking zolpidem 5 mg nightly now instead prn and sleeps really well. No falls at night.  11/16/23 appt noted: Meds same but added Syntrhoid and hard to change her routine :Lexapro 10, mirtazapine 45 mg HS, gabapentin 100 mg PM, lorazepam 0.5 mg BID and 1 mg HS. Levothyroxine 25 AM, zolpidem 5 mg HS Has to get up earlier to take it and doesn't like that.  So same amount of tiredness as ever.  No change in sx noted with levothyroxine.  Did have positive thing of family visisting  her lately.  Been out doing things and that has cheered her.  Enjoying family lately has been a blessing.   D in law visiting and it's been so nice.  She's a Administrator, Civil Service.  Generally sleeping well now with Ambien 5 HS.  No SE.   Can be a little irritable and sad if nothing to look forward to.  Excited about Christina Escobar and GD Christina Escobar coming to visit.     Past Psychiatric Medication Trials:  Mirtazapine, nefazodone SE, Lexapro 10 Ativan   Ambien Gabapentin helps sleep  Review of Systems:  Review of Systems  Constitutional:  Positive for fatigue.  HENT:  Positive for tinnitus.   Respiratory:  Negative for shortness of breath.   Cardiovascular:  Negative for palpitations.  Gastrointestinal:  Positive for constipation. Negative for abdominal pain and diarrhea.       Frequent BM  Musculoskeletal:  Positive for gait problem.  Neurological:  Positive for dizziness, tremors and weakness.  Psychiatric/Behavioral:  Positive for dysphoric mood and sleep disturbance. Negative for behavioral problems, confusion, decreased concentration, hallucinations, self-injury and suicidal ideas. The patient is nervous/anxious. The patient is not hyperactive.   GI px managed.  Medications: I have reviewed the patient's current medications.  Current Outpatient Medications  Medication Sig Dispense Refill   acetaminophen (TYLENOL) 325 MG tablet Take 325-650 mg by mouth every 8 (eight) hours as needed for mild pain or headache.     amLODipine (NORVASC) 5 MG tablet Take 1 tablet (5 mg total) by mouth daily. (Patient taking differently: Take 2.5 mg by mouth daily.) 30 tablet 0   aspirin EC 81 MG EC tablet Take 1 tablet (81 mg total) by mouth daily. 30 tablet 0   atorvastatin (LIPITOR) 40 MG tablet Take 1 tablet (40 mg total) by mouth daily at 6 PM. 30 tablet 0   Calcium Carbonate-Vitamin D3 600-400 MG-UNIT TABS Take 1 tablet by mouth daily.      escitalopram (LEXAPRO) 10 MG tablet TAKE ONE (1) TABLET BY MOUTH EVERY DAY 90 tablet 0    fluticasone (FLONASE) 50 MCG/ACT nasal spray Place 1 spray into both nostrils at bedtime.     gabapentin (NEURONTIN) 100 MG capsule Take 100 mg by mouth at bedtime.     levothyroxine (SYNTHROID) 25 MCG tablet Take 25 mcg by mouth daily.     lisinopril (ZESTRIL) 10 MG tablet Take 40 mg by mouth daily.     loratadine (CLARITIN) 10 MG tablet Take 10 mg by mouth daily as needed for allergies or rhinitis.      LORazepam (ATIVAN) 0.5 MG tablet 1 at 5 PM 30 tablet 3   LORazepam (ATIVAN) 1 MG tablet 1 in the AM and HS 60 tablet 3   Methylcellulose, Laxative, (  CITRUCEL PO) Take by mouth See admin instructions. Mix 4.5 heaping teaspoonsful of powder into 8 ounces of water and drink once a day at bedtime     mirtazapine (REMERON) 45 MG tablet Take 1 tablet (45 mg total) by mouth at bedtime. 90 tablet 1   Multiple Vitamin (MULTIVITAMIN WITH MINERALS) TABS tablet Take 1 tablet by mouth daily.     phenylephrine-shark liver oil-mineral oil-petrolatum (PREPARATION Christina) 0.25-14-74.9 % rectal ointment Place 1 application rectally 2 (two) times daily as needed for hemorrhoids.     polyethylene glycol (MIRALAX / GLYCOLAX) 17 g packet Take 17 g by mouth daily.     simethicone (MYLICON) 125 MG chewable tablet Chew 125 mg by mouth every 6 (six) hours as needed.     SUMAtriptan (IMITREX) 25 MG tablet Take 25 mg by mouth once as needed for migraine.     zolpidem (AMBIEN) 5 MG tablet Take 1 tablet (5 mg total) by mouth at bedtime as needed. (Patient taking differently: Take 5 mg by mouth at bedtime.) 30 tablet 5   No current facility-administered medications for this visit.    Medication Side Effects: Other: ? sedation related.  Allergies:  Allergies  Allergen Reactions   Adhesive [Tape] Other (See Comments)    Caused SEVERE skin irritation   Cefdinir Other (See Comments)    Caused C-Diff   Amitiza [Lubiprostone] Diarrhea   Erythromycin Nausea And Vomiting   Flagyl [Metronidazole] Diarrhea   Meloxicam Other  (See Comments)    Dizziness resulted after taking it     Past Medical History:  Diagnosis Date   Allergy    Anal fissure    Anxiety    Basal cell carcinoma    Cataracts, bilateral    Clostridium difficile diarrhea    Depression    Diverticulosis    hx of stricture in colon   Hemorrhoids    Hypertension    IBS (irritable bowel syndrome)    Insomnia    Migraine    Osteopenia    Thrombosed hemorrhoids     Family History  Problem Relation Age of Onset   Alzheimer's disease Mother    Heart failure Mother    Heart disease Father    Heart failure Father    Other Maternal Grandfather        cerebral hemorrhage   Skin cancer Brother    Heart disease Brother    Colon cancer Neg Hx    Stomach cancer Neg Hx    Pancreatic cancer Neg Hx     Social History   Socioeconomic History   Marital status: Married    Spouse name: Christina Escobar   Number of children: 2   Years of education: MA   Highest education level: Not on file  Occupational History   Occupation: retired Runner, broadcasting/film/video  Tobacco Use   Smoking status: Never   Smokeless tobacco: Never  Vaping Use   Vaping status: Never Used  Substance and Sexual Activity   Alcohol use: Yes    Alcohol/week: 0.0 standard drinks of alcohol    Comment: occasional glass of wine -2 per week   Drug use: No   Sexual activity: Not on file  Other Topics Concern   Not on file  Social History Narrative   Pt lives at home with her Spouse. She has 3 children, (1 adopted). Spouse is a retired Development worker, international aid.   Caffeine Use: 1-2 cups daily.   Social Drivers of Health   Financial Resource Strain: Low Risk  (02/05/2023)  Overall Financial Resource Strain (CARDIA)    Difficulty of Paying Living Expenses: Not hard at all  Food Insecurity: No Food Insecurity (02/05/2023)   Hunger Vital Sign    Worried About Running Out of Food in the Last Year: Never true    Ran Out of Food in the Last Year: Never true  Transportation Needs: No Transportation Needs  (02/05/2023)   PRAPARE - Administrator, Civil Service (Medical): No    Lack of Transportation (Non-Medical): No  Physical Activity: Insufficiently Active (02/05/2023)   Exercise Vital Sign    Days of Exercise per Week: 3 days    Minutes of Exercise per Session: 30 min  Stress: No Stress Concern Present (02/05/2023)   [employer] of Occupational Health - Occupational Stress Questionnaire    Feeling of Stress : Not at all  Social Connections: Socially Isolated (02/05/2023)   Social Connection and Isolation Panel [NHANES]    Frequency of Communication with Friends and Family: More than three times a week    Frequency of Social Gatherings with Friends and Family: Once a week    Attends Religious Services: Never    Database administrator or Organizations: No    Attends Banker Meetings: Never    Marital Status: Widowed  Intimate Partner Violence: Not At Risk (02/05/2023)   Humiliation, Afraid, Rape, and Kick questionnaire    Fear of Current or Ex-Partner: No    Emotionally Abused: No    Physically Abused: No    Sexually Abused: No    Past Medical History, Surgical history, Social history, and Family history were reviewed and updated as appropriate.   Please see review of systems for further details on the patient's review from today.   Objective:   Physical Exam:  There were no vitals taken for this visit.  Physical Exam Constitutional:      Appearance: Normal appearance.  Neurological:     Mental Status: She is alert.     Motor: Weakness present. No tremor.     Gait: Gait abnormal.     Comments: Walker   Psychiatric:        Attention and Perception: Attention and perception normal.        Mood and Affect: Mood is anxious and depressed.        Speech: Speech normal. Speech is not slurred.        Behavior: Behavior is not slowed. Behavior is cooperative.        Thought Content: Thought content is not delusional. Thought content does not include  homicidal or suicidal ideation.        Cognition and Memory: Cognition normal.     Comments: Insight and judgment good. Chronic anxiety worse situationally Rigidity of thought and resistant to changes.   Mild persistent sadness but no worse. Good cognition for age.      Lab Review:     Component Value Date/Time   NA 138 12/20/2019 1140   K 4.4 12/20/2019 1140   CL 102 12/20/2019 1140   CO2 26 12/20/2019 1140   GLUCOSE 106 (Christina) 12/20/2019 1140   BUN 12 12/20/2019 1140   CREATININE 0.96 12/20/2019 1140   CALCIUM 9.9 12/20/2019 1140   PROT 7.0 12/20/2019 1140   ALBUMIN 4.0 12/20/2019 1140   AST 32 12/20/2019 1140   ALT 16 12/20/2019 1140   ALKPHOS 71 12/20/2019 1140   BILITOT 0.7 12/20/2019 1140   GFRNONAA 53 (L) 12/20/2019 1140   GFRAA >60 12/20/2019  1140       Component Value Date/Time   WBC 5.7 12/20/2019 1140   RBC 4.11 12/20/2019 1140   HGB 12.6 12/20/2019 1140   HCT 38.6 12/20/2019 1140   PLT 244 12/20/2019 1140   MCV 93.9 12/20/2019 1140   MCH 30.7 12/20/2019 1140   MCHC 32.6 12/20/2019 1140   RDW 11.5 12/20/2019 1140   LYMPHSABS 2.2 12/20/2019 1140   MONOABS 0.4 12/20/2019 1140   EOSABS 0.1 12/20/2019 1140   BASOSABS 0.1 12/20/2019 1140    No results found for: "POCLITH", "LITHIUM"   No results found for: "PHENYTOIN", "PHENOBARB", "VALPROATE", "CBMZ"   .res Assessment: Plan:    Daleyssa was seen today for follow-up, depression and anxiety.  Diagnoses and all orders for this visit:  Generalized anxiety disorder  Insomnia due to mental condition    30 min face to face time with patient was spent on counseling and coordination of care. We discussed Ms. Elsberry has a long history of anxiety which in the past had contributed to unmanageable diarrhea another bowel symptoms including bowel pain.  These symptoms were much improved with a combination of lorazepam and mirtazapine.  She has some chronic insomnia which is generally manageable with the  medication until situationally worse.    We had been able to reduce the lorazepam some over the last couple of years and she is probably at the lowest tolerated dose and lowest effective dose. Lexapro helped some.  Anxiety is chronic but not disabling.  It would help if could get lorazepam when awakens at 630 AM but doesn't get it until 9 AM.  Better after lorazepam. .  She's satisfied with meds otherwise.  Pt does not describe dysphoric OC sx.  Not depressed. Disc stressor living at [facility] and being at mercy of them for getting meds on time.   Med sensitive.  [facility] MAR [facility]  Previously Disc relationship between IBS and diarrhea alternating with constipation.    Was Better after Lexapro 10 mg daily increased on 07/29/20.   Consider further increase to 15 mg later.  She's reluctant to change but might help irritability.  Continue mirtazapiine 45 mg HS which helped anxiety and stomach problems. IBS diarrhea better with increase.    Anxiety worse and diarrhea worse with less lorazepam.  So per her request increase back to lorazepam 1 mg at 7A and HS takes 0.5 mg  at 5 PM.    Sleep better with gabapentin.  Disc timing of meds. Cannot go to lower dose.    We discussed the short-term risks associated with benzodiazepines including sedation and increased fall risk among others.  Discussed long-term side effect risk including dependence, potential withdrawal symptoms, and the potential eventual dose-related risk of dementia.  But recent studies from 2020 dispute this association between benzodiazepines and dementia risk. Newer studies in 2020 do not support an association with dementia. No evidence that BZ affecting balance.  Disc option to reduce but she doesn't feel she can reduce it.    Use Ambien prn.  Disc risk amnesia.    She needs the meds.  No changes  Disc adjusting to Levothyroxine can take months but is likely to help energy.  This appt was 30 mins.  FU 4-6  mos  Christina Staggers, MD, DFAPA   Please see After Visit Summary for patient specific instructions.  Future Appointments  Date Time Provider Department Center  02/10/2024  2:20 PM [facility] ANNUAL Joan Flores VISIT [facility]  No orders of the defined types were placed in this encounter.     -------------------------------

## 2023-12-06 ENCOUNTER — Other Ambulatory Visit: Payer: Self-pay | Admitting: Psychiatry

## 2023-12-06 DIAGNOSIS — F411 Generalized anxiety disorder: Secondary | ICD-10-CM

## 2024-01-17 ENCOUNTER — Other Ambulatory Visit: Payer: Self-pay | Admitting: Psychiatry

## 2024-01-17 DIAGNOSIS — F411 Generalized anxiety disorder: Secondary | ICD-10-CM

## 2024-01-17 DIAGNOSIS — F5105 Insomnia due to other mental disorder: Secondary | ICD-10-CM

## 2024-02-11 ENCOUNTER — Telehealth: Payer: Self-pay

## 2024-02-11 NOTE — Transitions of Care (Post Inpatient/ED Visit) (Signed)
   02/11/2024  Name: Christina Escobar MRN: [id number] DOB: [birth year]  Today's TOC FU Call Status: Today's TOC FU Call Status:: Unsuccessful Call (1st Attempt) Unsuccessful Call (1st Attempt) Date: 02/11/24  Attempted to reach the patient regarding the most recent Inpatient/ED visit.  Follow Up Plan: Additional outreach attempts will be made to reach the patient to complete the Transitions of Care (Post Inpatient/ED visit) call.   Signature Darrall Ellison, LPN [facility] Nurse Health Advisor Direct Dial [phone]

## 2024-02-15 NOTE — Transitions of Care (Post Inpatient/ED Visit) (Unsigned)
   02/15/2024  Name: Christina Escobar MRN: [id number] DOB: 03-18-1930  Today's TOC FU Call Status: Today's TOC FU Call Status:: Unsuccessful Call (2nd Attempt) Unsuccessful Call (1st Attempt) Date: 02/11/24 Unsuccessful Call (2nd Attempt) Date: 02/15/24  Attempted to reach the patient regarding the most recent Inpatient/ED visit.  Follow Up Plan: Additional outreach attempts will be made to reach the patient to complete the Transitions of Care (Post Inpatient/ED visit) call.   Signature Darrall Ellison, LPN [facility] Nurse Health Advisor Direct Dial [phone]

## 2024-02-16 NOTE — Transitions of Care (Post Inpatient/ED Visit) (Signed)
   02/16/2024  Name: CHARNELE SEMPLE MRN: [id number] DOB: September 01, 1930  Today's TOC FU Call Status: Today's TOC FU Call Status:: Unsuccessful Call (3rd Attempt) Unsuccessful Call (1st Attempt) Date: 02/11/24 Unsuccessful Call (2nd Attempt) Date: 02/15/24 Unsuccessful Call (3rd Attempt) Date: 02/16/24  Attempted to reach the patient regarding the most recent Inpatient/ED visit.  Follow Up Plan: No further outreach attempts will be made at this time. We have been unable to contact the patient.  Signature Darrall Ellison, LPN [facility] Nurse Health Advisor Direct Dial [phone]

## 2024-05-18 ENCOUNTER — Ambulatory Visit: Admitting: Psychiatry

## 2024-05-18 ENCOUNTER — Encounter: Payer: Self-pay | Admitting: Psychiatry

## 2024-05-18 DIAGNOSIS — G2581 Restless legs syndrome: Secondary | ICD-10-CM

## 2024-05-18 DIAGNOSIS — F5105 Insomnia due to other mental disorder: Secondary | ICD-10-CM | POA: Diagnosis not present

## 2024-05-18 DIAGNOSIS — F411 Generalized anxiety disorder: Secondary | ICD-10-CM | POA: Diagnosis not present

## 2024-05-18 MED ORDER — LORAZEPAM 0.5 MG PO TABS: ORAL_TABLET | ORAL | 3 refills | Status: DC

## 2024-05-18 MED ORDER — ESCITALOPRAM OXALATE 10 MG PO TABS: 10.0000 mg | ORAL_TABLET | Freq: Every day | ORAL | 1 refills | Status: DC

## 2024-05-18 MED ORDER — LORAZEPAM 1 MG PO TABS: ORAL_TABLET | ORAL | 3 refills | Status: DC

## 2024-05-18 MED ORDER — MIRTAZAPINE 45 MG PO TABS: 45.0000 mg | ORAL_TABLET | Freq: Every day | ORAL | 1 refills | Status: DC

## 2024-05-18 NOTE — Progress Notes (Signed)
 Christina Escobar [id number] 1930-09-06 89 y.o.  Subjective:   Patient ID:  Christina Escobar is a 89 y.o. (DOB [birth year]) female.  Chief Complaint:  Chief Complaint  Patient presents with   Follow-up   Depression   Anxiety   Stress   Sleeping Problem    Christina Escobar presents to the office today for follow-up of chronic anxiety.    Seen August 24, 2019.  No meds were changed.  QT DT Covid.  Big sadness that Christina Escobar Christina Escobar moving to [place].     Ambien  used rarely. Not used #30 since Feb.  02/13/20 appt the following noted: Stroke 12/17/19 and hosp 4 days.  Then went to [facility] and upset she was given the wrong meds.  They reduced lorazepam  from baseline to 0.5 mg BID and pt had NV from it and felt anxious.  These sx resolved with the return to lorazepam  to 1 mg AM and 8 PM and 0.5 mg at 5 PM.    Upset she saw mice in the room.  After about a week had a fall bc of the mouse.   I can't get over it RE: poor health care from [facility].   Has since resumed normal dose of lorazepam . Still on mirtazapine  45 mg nightly. Hard time with decisions.  Not confident.  Hard to enjoy normal things.  Low energy. Fears ever having to go back to [employer].  Traumatized by the experience.  Always tense. Plan: continue She takes lorazepam  1 mg at 7A and HS takes 0.5 mg  at 5 PM.  Add Lexapro  5 for anxiety  04/15/20 appt with the following noted: Lexapro  has been a big help.  I think I it's helped.  Sleep better and handled Christina Escobar's death better than usual and less IBS.  Handles IBS better. Christina Escobar died 05-Apr-2020. He was ready to die.  That helped me a lot.  He was 88 yo.  Had a lot of family support for awhile but family gone. If misses sleep then next night takes Ambien .  1 Christina Escobar in law locally and that's it. Lost the dog too bc can't care for it. Moving to assisted living at [facility].  08/19/20 appt with following noted; Got real upset with move to apt.  I just can't accept  it.  Asked to have Lexapro  increased by PCP to 10 mg daily about 3 weeks ago.  It's really helped the IBS better than any med so far.  Really upset with various things and has done fine with previous moves.  Things damaged and lost. Family says she should just let it go but can't. Wonders about getting a PCP with geriatric experience.   Christina Escobar will visit at Christmas.  Christina Escobar and Christina Escobar will come at Christmas also.  Both sons really helpful. Escobar Christina Escobar has baby girl premature and doing better. Plan continue Lexapro  but increase to 10 mg daily.  11/18/2020 appointment with the following noted: Increased Lexapro  10 daily and has been doing well with it.    It's helped the diarrhea tremendously more than other GI treatments.  So I love it. No SE. Frustrated dealing with urinary incontinence. Had been dealing with so many loses since here and at the last appt. Been busy taking care of evertything.  B died in 2024-08-23 in [place].  One of kids took her there.   Upset PCP resigned a couple of weeks ago.  Getting a new one. Worry over not getting comprehensive  care.    Questions about her BP Christina Escobar is so happy with her baby girl.    Misses Christina Escobar [name]. Plan: No med changes  03/24/2021 appointment with the following noted: Was doing alright.  Friday Wellness checkup. Tense and sad over events.  Christina Escobar and Christina Escobar both sold homes and will move far away.  One in [place] and one in [place] state.  Christmas always been at their house and now they've planned to move away.  Middle Christina Escobar less available.  So sad about it. Making more friends where she lives.  Has 2. Christina Christina Escobar's Christina Escobar came to [facility] from [employer] [place].  Christina Escobar got better job in [place]. She finished her program. She will continue college but move for the summer. Sad not depressed. Christina Escobar is POA and moving to [place] with his inlaws. 6 mos in her apt.  Hard time for her. Some mornings awaken with anxiety.  Easy startle. Sleeping well bc worn out. Usually  naps. Plan:Better after Lexapro  10 mg daily increased on 07/29/20.   IBS diarrhea resolved with increase.   Do not reduce lorazepam  further She takes lorazepam  1 mg at 7A and HS takes 0.5 mg  at 5 PM.   07/21/21 appt noted: No migraine and doesn't mind diarrhea so no immodium. Hx CVA 12/17/19  and followed at [facility].  R leg weakness and using a walker.  Fatigue also. Got to see Escobar and 1 yo baby. Christina Escobar came and said pt has OC sx.  Pt says she just wants to do things herself.  Pt prefers arrangment of things in her bathroom.  Christina Escobar thinks she worries too much. Still reads novels and tries to stay active.  Sometimes eats alone and doesn't mind it. Patient reports stable mood and denies depressed or irritable moods.  Patient denies difficulty with sleep initiation or maintenance. Denies appetite disturbance.  Patient reports that energy and motivation have been good.  Patient denies any difficulty with concentration.  Patient denies any suicidal ideation. Plan: She needs the meds.  No med changes indicated.    11/24/21 appt noted: Can't do as much for herself and needs help.  Conisdering move to asst living. Has met with people in charge at [facility].  Not as many social opportunities and more lonely with family far.  I think I'll be OK. Needs to eat meals with people. No problems with meds.  No SE Chronic bowel issues.   Ativan  helps and notices if misses.  05/19/2022 appointment noted: Nervous every morning. Sleep better with gabapening 100 mg hs Hates the [facility].  Not as much help.  Doesn't like the lack of control of lorazepam  which creates anxiety.  Activities interfere with nap. Tired all the time.  Uses scooter which helped a  lot. Gets dizzy in middle of the night.  10/13/22 appt noted: No PCP and needs one.  Disc getting one. Still anxiety in the AM. Questions about how many meds she is on and does this contribute to her fatigue. Kind of sad sometimes.  When has a  lot to do then can do it and not so tired.  But don't always have a lot to do.  Doesn't really like assisted living as much as independent living.  Doesn't want to move again. If busy not disturbed by sadness.  Not smiling as much as she should. Covid before Xmas.  Would like more assistance than she gets.  Not that anxiousl RLS managed with gabapentin.  Plan: Option reduce lorazepam  further to try to minimize risk, but historically anxiety is worse in the AM She takes lorazepam  1 mg at 7A and HS takes 0.5 mg  at 5 PM.   OK reduce lorazepam  0.5 mg at 7 AM,  0.5 mg at 5 PM and 1 mg HS  01/11/23 appt noted: Psych meds:Lexapro  10, mirtazapine  45 mg HS, gabapentin 100 mg PM, lorazepam  0.5 mg BID and 1 mg HS.  The started giving her Ambien  5 mg HS scheduled . Very unhappy lately.  Dentist made a mistake with teeth.  A lot of turnover at [facility] and they changed her meds.  Resumed diarrhea a couple of times and fears diarrhea.  Hard to see the doctor.  So mad about it.  Worrying over health issues and yet can't see a doctor.  Wants to know what meds she is taking.  Wrong walker.   Admits she was asking for Ambien  most days. Did reduce lorazepam  as planned.   Christina Escobar graduating from [employer].   Feels coworkers at [employer] know her and don't care.   Dosage of lisinopril  has been reduced.    Frustrated that lorazepam  timing is not right bc of staff. She used to manage her meds but cannot do so now.   She feels she needs the higher dose of lorazepam .  They keep me nervous there.  Energy is not much better but might be a little different bc I'm so mad.     04/12/23 appt noted:  Upset docs at [facility] reduced BP meds without telling her and she had period of htn.  It is now more normal.  Does not think they have good communication.  She has increased some of Bpmed back up.  Only low BP one time.   Psych meds as above. But not always on time.   Anxiety re: to above but not otherwise.  PLans to  go see family in [place]. Next week to see Escobar and baby Sherron and [place]. Satisfied with current meds.  But not with way it is administered.   Some concerns that some meds will run out when she is gone. Gabapentin causes some over sedation at night that wears off.  07/19/23 appt noted: Rec for PT.  Using walker.  Had a fall dealing with fridge door.  Pulled the door out from the fall.  Couldn't call for help.  Legs were fine.  Was trying to call for help.  Took 20 min for anyone to hear her.  Maintenance came to affix fridge to wall to reduce chance of it happening again.  Not as much attn there as she would like.  Xrays normal.  But rec for PT.  I don't think I quite get over it.   More down and tearful than she has been but not consistent.  Usually good if has to do that enjoyable.  Can get overwhelmed at times.  If gets behind on things gets upset.  More that she cannot do and this upsets her DT age issues.  Middle Christina Escobar coming this weekend to help her.  Hard to pick up things from the floor.  Vision not as good doing routine things.   At beginning of day can see OK and can write well but by end of the day eyes are tired and hard to see and more dexterity problems. Doesn't think she's having more SE with BZ. They administer lorazepam  and gets it at 8 but would like  it when gets up at 6 AM.  Less tired with less BP med.   She doesn't feel as well comfortable being alone but is managing.   Can use smartphone.  But gets frustrated with it.   On assisted living.   Taking zolpidem  5 mg nightly now instead prn and sleeps really well. No falls at night.  11/16/23 appt noted: Meds same but added Syntrhoid and hard to change her routine :Lexapro  10, mirtazapine  45 mg HS, gabapentin 100 mg PM, lorazepam  0.5 mg BID and 1 mg HS. Levothyroxine 25 AM, zolpidem  5 mg HS Has to get up earlier to take it and doesn't like that.  So same amount of tiredness as ever.  No change in sx noted with levothyroxine.  Did have  positive thing of family visisting her lately.  Been out doing things and that has cheered her.  Enjoying family lately has been a blessing.   Christina Escobar in law visiting and it's been so nice.  She's a Administrator, Civil Service.  Generally sleeping well now with Ambien  5 HS.  No SE.   Can be a little irritable and sad if nothing to look forward to.  Excited about Escobar and Christina Escobar Ruby coming to visit.    05/18/24 APPT NOTED:  Med: :Lexapro  10, mirtazapine  45 mg HS, gabapentin 100 mg PM, lorazepam  1 mg am and 0.5 mg and 1 mg HS. Levothyroxine 25 AM, zolpidem  5 mg HS nightly Didn't notice any benefit with TSH.  But normal level now. 3.91 on 01/10/24. No other med changes.   One big thing happened that is unexplained.  Passed out on the toilet.  To hospital [facility].  Normal workup.  No further incidents.   Need Ambien  nightly now .   Gabapentin sedating at night, wears off the strong effect by [place].  Taking it for RLS, controlled.   Found out she has gluten problem.   Used to feel compelled to pick up things from floor and now I don't.  Reads instead of doing chores.  Enjoys it a lot.   Has Macular degeneration.   Some periods of dep since here.   Niece visists tomorrow.  And I want be depressed.  But when alone a long time then will get angry and down.  Maybe more discouraged and disappointed.  Not very happy unless someone in family is coming to visit.  Her best friend died .   Not irritable all the time just when provoked.  Is more nervous in am .  Afterpills at night relaxes and feels better.  Overall she feels she is doing well under the circumstances.    Past Psychiatric Medication Trials:   Mirtazapine , nefazodone SE, Lexapro  15 Ativan    Ambien  Gabapentin helps sleep and RLS  Review of Systems:  Review of Systems  Constitutional:  Positive for fatigue.  HENT:  Positive for tinnitus.   Respiratory:  Negative for shortness of breath.   Cardiovascular:  Negative for palpitations.  Gastrointestinal:  Positive for  constipation. Negative for abdominal pain and diarrhea.       Frequent BM  Musculoskeletal:  Positive for gait problem.  Neurological:  Positive for dizziness, tremors and weakness.  Psychiatric/Behavioral:  Positive for dysphoric mood and sleep disturbance. Negative for behavioral problems, confusion, decreased concentration, hallucinations, self-injury and suicidal ideas. The patient is nervous/anxious. The patient is not hyperactive.   GI px managed.  Medications: I have reviewed the patient's current medications.  Current Outpatient Medications  Medication Sig Dispense  Refill   acetaminophen  (TYLENOL ) 325 MG tablet Take 325-650 mg by mouth every 8 (eight) hours as needed for mild pain or headache.     amLODipine  (NORVASC ) 5 MG tablet Take 1 tablet (5 mg total) by mouth daily. (Patient taking differently: Take 2.5 mg by mouth daily.) 30 tablet 0   aspirin  EC 81 MG EC tablet Take 1 tablet (81 mg total) by mouth daily. 30 tablet 0   atorvastatin  (LIPITOR) 40 MG tablet Take 1 tablet (40 mg total) by mouth daily at 6 PM. 30 tablet 0   Calcium  Carbonate-Vitamin D3 600-400 MG-UNIT TABS Take 1 tablet by mouth daily.      fluticasone  (FLONASE ) 50 MCG/ACT nasal spray Place 1 spray into both nostrils at bedtime.     gabapentin (NEURONTIN) 100 MG capsule Take 100 mg by mouth at bedtime.     levothyroxine (SYNTHROID) 25 MCG tablet Take 25 mcg by mouth daily.     lisinopril  (ZESTRIL ) 10 MG tablet Take 40 mg by mouth daily.     loratadine  (CLARITIN ) 10 MG tablet Take 10 mg by mouth daily as needed for allergies or rhinitis.      Methylcellulose, Laxative, (CITRUCEL PO) Take by mouth See admin instructions. Mix 4.5 heaping teaspoonsful of powder into 8 ounces of water and drink once a day at bedtime     Multiple Vitamin (MULTIVITAMIN WITH MINERALS) TABS tablet Take 1 tablet by mouth daily.     phenylephrine-shark liver oil-mineral oil-petrolatum (PREPARATION Christina Escobar) 0.25-14-74.9 % rectal ointment Place 1  application rectally 2 (two) times daily as needed for hemorrhoids.     polyethylene glycol (MIRALAX  / GLYCOLAX ) 17 g packet Take 17 g by mouth daily.     simethicone  (MYLICON) 125 MG chewable tablet Chew 125 mg by mouth every 6 (six) hours as needed.     SUMAtriptan  (IMITREX ) 25 MG tablet Take 25 mg by mouth once as needed for migraine.     zolpidem  (AMBIEN ) 5 MG tablet Take 1 tablet (5 mg total) by mouth at bedtime as needed. (Patient taking differently: Take 5 mg by mouth at bedtime.) 30 tablet 5   escitalopram  (LEXAPRO ) 10 MG tablet Take 1 tablet (10 mg total) by mouth daily. 90 tablet 1   LORazepam  (ATIVAN ) 0.5 MG tablet 1 at 5 PM 30 tablet 3   LORazepam  (ATIVAN ) 1 MG tablet 1 in the AM and HS 60 tablet 3   mirtazapine  (REMERON ) 45 MG tablet Take 1 tablet (45 mg total) by mouth at bedtime. 90 tablet 1   No current facility-administered medications for this visit.    Medication Side Effects: Other: ? sedation related.  Allergies:  Allergies  Allergen Reactions   Adhesive [Tape] Other (See Comments)    Caused SEVERE skin irritation   Cefdinir Other (See Comments)    Caused C-Diff   Amitiza  [Lubiprostone ] Diarrhea   Erythromycin Nausea And Vomiting   Flagyl [Metronidazole] Diarrhea   Meloxicam Other (See Comments)    Dizziness resulted after taking it     Past Medical History:  Diagnosis Date   Allergy    Anal fissure    Anxiety    Basal cell carcinoma    Cataracts, bilateral    Clostridium difficile diarrhea    Depression    Diverticulosis    hx of stricture in colon   Hemorrhoids    Hypertension    IBS (irritable bowel syndrome)    Insomnia    Migraine    Osteopenia    Thrombosed  hemorrhoids     Family History  Problem Relation Age of Onset   Alzheimer's disease Mother    Heart failure Mother    Heart disease Father    Heart failure Father    Other Maternal Grandfather        cerebral hemorrhage   Skin cancer Brother    Heart disease Brother    Colon  cancer Neg Hx    Stomach cancer Neg Hx    Pancreatic cancer Neg Hx     Social History   Socioeconomic History   Marital status: Married    Spouse name: Solicitor   Number of children: 2   Years of education: MA   Highest education level: Not on file  Occupational History   Occupation: retired Runner, broadcasting/film/video  Tobacco Use   Smoking status: Never   Smokeless tobacco: Never  Vaping Use   Vaping status: Never Used  Substance and Sexual Activity   Alcohol use: Yes    Alcohol/week: 0.0 standard drinks of alcohol    Comment: occasional glass of wine -2 per week   Drug use: No   Sexual activity: Not on file  Other Topics Concern   Not on file  Social History Narrative   Pt lives at home with her Spouse. She has 3 children, (1 adopted). Spouse is a retired Development worker, international aid.   Caffeine  Use: 1-2 cups daily.   Social Drivers of Corporate investment banker Strain: Low Risk  (02/05/2023)   Overall Financial Resource Strain (CARDIA)    Difficulty of Paying Living Expenses: Not hard at all  Food Insecurity: Low Risk  (02/07/2024)   Received from [facility]   Hunger Vital Sign    Within the past 12 months, you worried that your food would run out before you got money to buy more: Never true    Within the past 12 months, the food you bought just didn't last and you didn't have money to get more. : Never true  Transportation Needs: No Transportation Needs (02/07/2024)   Received from [facility]    In the past 12 months, has lack of reliable transportation kept you from medical appointments, meetings, work or from getting things needed for daily living? : No  Physical Activity: Insufficiently Active (02/05/2023)   Exercise Vital Sign    Days of Exercise per Week: 3 days    Minutes of Exercise per Session: 30 min  Stress: No Stress Concern Present (02/05/2023)   [employer] of Occupational Health - Occupational Stress Questionnaire    Feeling of Stress : Not at all  Social  Connections: Socially Isolated (02/05/2023)   Social Connection and Isolation Panel    Frequency of Communication with Friends and Family: More than three times a week    Frequency of Social Gatherings with Friends and Family: Once a week    Attends Religious Services: Never    Database administrator or Organizations: No    Attends Banker Meetings: Never    Marital Status: Widowed  Intimate Partner Violence: Not At Risk (02/05/2023)   Humiliation, Afraid, Rape, and Kick questionnaire    Fear of Current or Ex-Partner: No    Emotionally Abused: No    Physically Abused: No    Sexually Abused: No    Past Medical History, Surgical history, Social history, and Family history were reviewed and updated as appropriate.   Please see review of systems for further details on the patient's review from today.  Objective:   Physical Exam:  There were no vitals taken for this visit.  Physical Exam Constitutional:      Appearance: Normal appearance.  Neurological:     Mental Status: She is alert.     Motor: Weakness present. No tremor.     Gait: Gait abnormal.     Comments: Walker   Psychiatric:        Attention and Perception: Attention and perception normal.        Mood and Affect: Mood is anxious and depressed.        Speech: Speech normal. Speech is not slurred.        Behavior: Behavior is not slowed. Behavior is cooperative.        Thought Content: Thought content is not delusional. Thought content does not include homicidal or suicidal ideation.        Cognition and Memory: Cognition normal.     Comments: Insight and judgment good. Chronic anxiety better with incr BZ Rigidity of thought less prominent Mild irritability Good cognition for age.      Lab Review:     Component Value Date/Time   NA 138 12/20/2019 1140   K 4.4 12/20/2019 1140   CL 102 12/20/2019 1140   CO2 26 12/20/2019 1140   GLUCOSE 106 (Christina Escobar) 12/20/2019 1140   BUN 12 12/20/2019 1140   CREATININE  0.96 12/20/2019 1140   CALCIUM  9.9 12/20/2019 1140   PROT 7.0 12/20/2019 1140   ALBUMIN 4.0 12/20/2019 1140   AST 32 12/20/2019 1140   ALT 16 12/20/2019 1140   ALKPHOS 71 12/20/2019 1140   BILITOT 0.7 12/20/2019 1140   GFRNONAA 53 (L) 12/20/2019 1140   GFRAA >60 12/20/2019 1140       Component Value Date/Time   WBC 5.7 12/20/2019 1140   RBC 4.11 12/20/2019 1140   HGB 12.6 12/20/2019 1140   HCT 38.6 12/20/2019 1140   PLT 244 12/20/2019 1140   MCV 93.9 12/20/2019 1140   MCH 30.7 12/20/2019 1140   MCHC 32.6 12/20/2019 1140   RDW 11.5 12/20/2019 1140   LYMPHSABS 2.2 12/20/2019 1140   MONOABS 0.4 12/20/2019 1140   EOSABS 0.1 12/20/2019 1140   BASOSABS 0.1 12/20/2019 1140    No results found for: POCLITH, LITHIUM   No results found for: PHENYTOIN, PHENOBARB, VALPROATE, CBMZ   .res Assessment: Plan:    Kenyanna was seen today for follow-up, depression, anxiety, stress and sleeping problem.  Diagnoses and all orders for this visit:  Generalized anxiety disorder -     mirtazapine  (REMERON ) 45 MG tablet; Take 1 tablet (45 mg total) by mouth at bedtime. -     LORazepam  (ATIVAN ) 0.5 MG tablet; 1 at 5 PM -     LORazepam  (ATIVAN ) 1 MG tablet; 1 in the AM and HS -     escitalopram  (LEXAPRO ) 10 MG tablet; Take 1 tablet (10 mg total) by mouth daily.  Insomnia due to mental condition -     mirtazapine  (REMERON ) 45 MG tablet; Take 1 tablet (45 mg total) by mouth at bedtime. -     LORazepam  (ATIVAN ) 1 MG tablet; 1 in the AM and HS  Controlled restless legs syndrome     30 min face to face time with patient was spent on counseling and coordination of care. We discussed Ms. Can has a long history of anxiety which in the past had contributed to unmanageable diarrhea another bowel symptoms including bowel pain.  These symptoms were much improved with  a combination of lorazepam  and mirtazapine .  She has some chronic insomnia which is generally manageable with the  medication until situationally worse.    We had been able to reduce the lorazepam  some over the last couple of years and she is probably at the lowest tolerated dose and lowest effective dose. Lexapro  helped some.  Anxiety is chronic but not disabling.  It would help if could get lorazepam  when awakens at 630 AM but doesn't get it until 9 AM.  Better after lorazepam . SABRA  She's satisfied with meds otherwise.  Pt does not describe dysphoric OC sx.  Not depressed. Disc grief process.  Has moved into acceptance stage as expeced.   Overall doing well .  No med changes  Med sensitive.  Reviewed MAR from [employer].  She takes her owqn meds.  Previously Disc relationship between IBS and diarrhea alternating with constipation.    Was Better after Lexapro  10 mg daily increased on 07/29/20.   Consider further increase to 15 mg later.  She's reluctant to change but might help irritability.  Continue mirtazapiine 45 mg HS which helped anxiety and stomach problems. IBS diarrhea better with increase.    Anxiety worse and diarrhea worse with less lorazepam .  So per her request increase back to lorazepam  1 mg at 7A and HS takes 0.5 mg  at 5 PM.    Sleep better with gabapentin.  Disc timing of meds. Cannot go to lower dose.    We discussed the short-term risks associated with benzodiazepines including sedation and increased fall risk among others.  Discussed long-term side effect risk including dependence, potential withdrawal symptoms, and the potential eventual dose-related risk of dementia.  But recent studies from 2020 dispute this association between benzodiazepines and dementia risk. Newer studies in 2020 do not support an association with dementia. No evidence that BZ affecting balance.  Disc option to reduce but she doesn't feel she can reduce it.  Objective, subjective reasons to believe BZ works.  Counseling 20 min: on depression and need for social interaction which clearly brings her out of it.   Also keeping up with children and gchildren.    Use Ambien  regularly now.  Disc risk amnesia.    She needs the meds.  No changes:   Med: :Lexapro  10, mirtazapine  45 mg HS, gabapentin 100 mg PM, lorazepam  0.5 mg BID and 1 mg HS. Levothyroxine 25 AM, zolpidem  5 mg HS nightly  Disc adjusting to Levothyroxine can take months but is likely to help energy.  This appt was 30 mins.  FU 4-6 mos  Lorene Macintosh, MD, DFAPA   Please see After Visit Summary for patient specific instructions.  No future appointments.   No orders of the defined types were placed in this encounter.     -------------------------------

## 2024-09-18 ENCOUNTER — Other Ambulatory Visit: Payer: Self-pay | Admitting: Psychiatry

## 2024-09-18 DIAGNOSIS — F411 Generalized anxiety disorder: Secondary | ICD-10-CM

## 2024-10-19 ENCOUNTER — Encounter: Payer: Self-pay | Admitting: Psychiatry

## 2024-10-19 ENCOUNTER — Ambulatory Visit: Admitting: Psychiatry

## 2024-10-19 DIAGNOSIS — F411 Generalized anxiety disorder: Secondary | ICD-10-CM

## 2024-10-19 DIAGNOSIS — F5105 Insomnia due to other mental disorder: Secondary | ICD-10-CM

## 2024-10-19 DIAGNOSIS — L280 Lichen simplex chronicus: Secondary | ICD-10-CM

## 2024-10-19 MED ORDER — MIRTAZAPINE 45 MG PO TABS: 45.0000 mg | ORAL_TABLET | Freq: Every day | ORAL | 1 refills | Status: AC

## 2024-10-19 MED ORDER — LORAZEPAM 1 MG PO TABS: ORAL_TABLET | ORAL | 3 refills | Status: AC

## 2024-10-19 MED ORDER — ESCITALOPRAM OXALATE 10 MG PO TABS: 10.0000 mg | ORAL_TABLET | Freq: Every day | ORAL | 1 refills | Status: AC

## 2024-10-19 MED ORDER — LORAZEPAM 0.5 MG PO TABS: ORAL_TABLET | ORAL | 3 refills | Status: AC

## 2024-10-19 NOTE — Patient Instructions (Signed)
 Option cotton gloves to help with compulsive scratching.

## 2024-10-19 NOTE — Progress Notes (Signed)
 Christina Escobar [id number] 07-16-1930 89 y.o.  Subjective:   Patient ID:  Christina Escobar is a 89 y.o. (DOB 1930-05-03) female.  Chief Complaint:  Chief Complaint  Patient presents with   Follow-up   Anxiety   Sleeping Problem   Medication Reaction    RAKEB KIBBLE presents to the office today for follow-up of chronic anxiety.    Seen 20-Sep-2019.  No meds were changed.  QT DT Covid.  Big sadness that [name] moving to [place].     Ambien  used rarely. Not used #30 since Feb.  02/13/20 appt the following noted: Stroke 12/17/19 and hosp 4 days.  Then went to [facility] and upset she was given the wrong meds.  They reduced lorazepam  from baseline to 0.5 mg BID and pt had NV from it and felt anxious.  These sx resolved with the return to lorazepam  to 1 mg AM and 8 PM and 0.5 mg at 5 PM.    Upset she saw mice in the room.  After about a week had a fall bc of the mouse.   I can't get over it RE: poor health care from [facility].   Has since resumed normal dose of lorazepam . Still on mirtazapine  45 mg nightly. Hard time with decisions.  Not confident.  Hard to enjoy normal things.  Low energy. Fears ever having to go back to [employer].  Traumatized by the experience.  Always tense. Plan: continue She takes lorazepam  1 mg at 7A and HS takes 0.5 mg  at 5 PM.  Add Lexapro  5 for anxiety  04/15/20 appt with the following noted: Lexapro  has been a big help.  I think I it's helped.  Sleep better and handled H's death better than usual and less IBS.  Handles IBS better. H died 2020/05/01. He was ready to die.  That helped me a lot.  He was 89 yo.  Had a lot of family support for awhile but family gone. If misses sleep then next night takes Ambien .  1 son in law locally and that's it. Lost the dog too bc can't care for it. Moving to assisted living at [facility].  08/19/20 appt with following noted; Got real upset with move to apt.  I just can't accept  it.  Asked to have Lexapro  increased by PCP to 10 mg daily about 3 weeks ago.  It's really helped the IBS better than any med so far.  Really upset with various things and has done fine with previous moves.  Things damaged and lost. Family says she should just let it go but can't. Wonders about getting a PCP with geriatric experience.   Son will visit at Christmas.  GD and D-in-law will come at Christmas also.  Both sons really helpful. [name] GD has baby girl premature and doing better. Plan continue Lexapro  but increase to 10 mg daily.  11/18/2020 appointment with the following noted: Increased Lexapro  10 daily and has been doing well with it.    It's helped the diarrhea tremendously more than other GI treatments.  So I love it. No SE. Frustrated dealing with urinary incontinence. Had been dealing with so many loses since here and at the last appt. Been busy taking care of evertything.  B died in 09-19-2025 in [place].  One of kids took her there.   Upset PCP resigned a couple of weeks ago.  Getting a new one. Worry over not getting comprehensive care.  Questions about her BP GD [name] is so happy with her baby girl.    Misses [name]. Plan: No med changes  03/24/2021 appointment with the following noted: Was doing alright.  Friday Wellness checkup. Tense and sad over events.  Son and D both sold homes and will move far away.  One in [place] and one in [place] state.  Christmas always been at their house and now they've planned to move away.  Middle son less available.  So sad about it. Making more friends where she lives.  Has 2. Youngest son's D came to [employer] from [employer] [place].  Son got better job in [place]. She finished her program. She will continue college but move for the summer. Sad not depressed. Youngest son Maude is POA and moving to [place] with his inlaws. 6 mos in her apt.  Hard time for her. Some mornings awaken with anxiety.  Easy startle. Sleeping well bc worn out. Usually  naps. Plan:Better after Lexapro  10 mg daily increased on 07/29/20.   IBS diarrhea resolved with increase.   Do not reduce lorazepam  further She takes lorazepam  1 mg at 7A and HS takes 0.5 mg  at 5 PM.   07/21/21 appt noted: No migraine and doesn't mind diarrhea so no immodium. Hx CVA 12/17/19  and followed at [facility].  R leg weakness and using a walker.  Fatigue also. Got to see [name] and 1 yo baby. D came and said pt has OC sx.  Pt says she just wants to do things herself.  Pt prefers arrangment of things in her bathroom.  D thinks she worries too much. Still reads novels and tries to stay active.  Sometimes eats alone and doesn't mind it. Patient reports stable mood and denies depressed or irritable moods.  Patient denies difficulty with sleep initiation or maintenance. Denies appetite disturbance.  Patient reports that energy and motivation have been good.  Patient denies any difficulty with concentration.  Patient denies any suicidal ideation. Plan: She needs the meds.  No med changes indicated.    11/24/21 appt noted: Can't do as much for herself and needs help.  Conisdering move to asst living. Has met with people in charge at [facility].  Not as many social opportunities and more lonely with family far.  I think I'll be OK. Needs to eat meals with people. No problems with meds.  No SE Chronic bowel issues.   Ativan  helps and notices if misses.  05/19/2022 appointment noted: Nervous every morning. Sleep better with gabapening 100 mg hs [facility].  Not as much help.  Doesn't like the lack of control of lorazepam  which creates anxiety.  Activities interfere with nap. Tired all the time.  Uses scooter which helped a  lot. Gets dizzy in middle of the night.  10/13/22 appt noted: No PCP and needs one.  Disc getting one. Still anxiety in the AM. Questions about how many meds she is on and does this contribute to her fatigue. Kind of sad sometimes.  When has a  lot to do then can do it and not so tired.  But don't always have a lot to do.  Doesn't really like assisted living as much as independent living.  Doesn't want to move again. If busy not disturbed by sadness.  Not smiling as much as she should. Covid before Xmas.  Would like more assistance than she gets.  Not that anxiousl RLS managed with gabapentin. Plan: Option reduce lorazepam   further to try to minimize risk, but historically anxiety is worse in the AM She takes lorazepam  1 mg at 7A and HS takes 0.5 mg  at 5 PM.   OK reduce lorazepam  0.5 mg at 7 AM,  0.5 mg at 5 PM and 1 mg HS  01/11/23 appt noted: Psych meds:Lexapro  10, mirtazapine  45 mg HS, gabapentin 100 mg PM, lorazepam  0.5 mg BID and 1 mg HS.  The started giving her Ambien  5 mg HS scheduled . Very unhappy lately.  Dentist made a mistake with teeth.  A lot of turnover at [facility] and they changed her meds.  Resumed diarrhea a couple of times and fears diarrhea.  Hard to see the doctor.  So mad about it.  Worrying over health issues and yet can't see a doctor.  Wants to know what meds she is taking.  Wrong walker.   Admits she was asking for Ambien  most days. Did reduce lorazepam  as planned.   GD graduating from [employer].   Feels coworkers at [employer] know her and don't care.   Dosage of lisinopril  has been reduced.    Frustrated that lorazepam  timing is not right bc of staff. She used to manage her meds but cannot do so now.   She feels she needs the higher dose of lorazepam .  They keep me nervous there.  Energy is not much better but might be a little different bc I'm so mad.     04/12/23 appt noted:  Upset docs at [facility] reduced BP meds without telling her and she had period of htn.  It is now more normal.  Does not think they have good communication.  She has increased some of Bpmed back up.  Only low BP one time.   Psych meds as above. But not always on time.   Anxiety re: to above but not otherwise.  PLans to  go see family in [place]. Next week to see [name] and baby Sherron and [place]. Satisfied with current meds.  But not with way it is administered.   Some concerns that some meds will run out when she is gone. Gabapentin causes some over sedation at night that wears off.  07/19/23 appt noted: Rec for PT.  Using walker.  Had a fall dealing with fridge door.  Pulled the door out from the fall.  Couldn't call for help.  Legs were fine.  Was trying to call for help.  Took 20 min for anyone to hear her.  Maintenance came to affix fridge to wall to reduce chance of it happening again.  Not as much attn there as she would like.  Xrays normal.  But rec for PT.  I don't think I quite get over it.   More down and tearful than she has been but not consistent.  Usually good if has to do that enjoyable.  Can get overwhelmed at times.  If gets behind on things gets upset.  More that she cannot do and this upsets her DT age issues.  Middle son coming this weekend to help her.  Hard to pick up things from the floor.  Vision not as good doing routine things.   At beginning of day can see OK and can write well but by end of the day eyes are tired and hard to see and more dexterity problems. Doesn't think she's having more SE with BZ. They administer lorazepam  and gets it at 8 but would like it when gets up  at 6 AM.  Less tired with less BP med.   She doesn't feel as well comfortable being alone but is managing.   Can use smartphone.  But gets frustrated with it.   On assisted living.   Taking zolpidem  5 mg nightly now instead prn and sleeps really well. No falls at night.  11/16/23 appt noted: Meds same but added Syntrhoid and hard to change her routine :Lexapro  10, mirtazapine  45 mg HS, gabapentin 100 mg PM, lorazepam  0.5 mg BID and 1 mg HS. Levothyroxine 25 AM, zolpidem  5 mg HS Has to get up earlier to take it and doesn't like that.  So same amount of tiredness as ever.  No change in sx noted with levothyroxine.  Did have  positive thing of family visisting her lately.  Been out doing things and that has cheered her.  Enjoying family lately has been a blessing.   D in law visiting and it's been so nice.  She's a administrator, civil service.  Generally sleeping well now with Ambien  5 HS.  No SE.   Can be a little irritable and sad if nothing to look forward to.  Excited about [name] and GD Ruby coming to visit.    05/18/24 APPT NOTED:  Med: :Lexapro  10, mirtazapine  45 mg HS, gabapentin 100 mg PM, lorazepam  1 mg am and 0.5 mg and 1 mg HS. Levothyroxine 25 AM, zolpidem  5 mg HS nightly Didn't notice any benefit with TSH.  But normal level now. 3.91 on 01/10/24. No other med changes.   One big thing happened that is unexplained.  Passed out on the toilet.  To hospital [facility].  Normal workup.  No further incidents.   Need Ambien  nightly now .   Gabapentin sedating at night, wears off the strong effect by [place].  Taking it for RLS, controlled.   Found out she has gluten problem.   Used to feel compelled to pick up things from floor and now I don't.  Reads instead of doing chores.  Enjoys it a lot.   Has Macular degeneration.   Some periods of dep since here.   Niece visists tomorrow.  And I want be depressed.  But when alone a long time then will get angry and down.  Maybe more discouraged and disappointed.  Not very happy unless someone in family is coming to visit.  Her best friend died .   Not irritable all the time just when provoked.  Is more nervous in am .  Afterpills at night relaxes and feels better.  Overall she feels she is doing well under the circumstances.    10/19/24 appt noted:  Med: Lexapro  10, mirtazapine  45 mg HS, gabapentin 100 mg PM, lorazepam  1 mg am and 0.5 mg and 1 mg HS. Levothyroxine 25 AM, zolpidem  5 mg HS nightly now Sleep is very good now but needed zolpidem  to add in order to sleep.   Problem with health started in Oct.  Dendritis and the tx caused itching.  Now got into habit of scratching her head and now  scratched it too much .  Itching chest and head.  Such a habit I don't know how to stop.  Almost compulsive scratching without itching now.  Wonders about how to stop it.  D in law is a international aid/development worker.   Cannot eat deserts at dining room Staff at dining room is caring.  Good appetite.   Too much company and decorations before Xmas and then after Xmas got overwhelmed and  dep and wouldn't do anything for awhile.  No visitors for awhile and took until Jan 9 to get over it when niece came and helped her put up [employer].  Not good arrangement with this Xmas  routine.  Not markedly dep now.   Overall helath is good and no new concerns other than mentioned.  Past Psychiatric Medication Trials:   Mirtazapine , nefazodone SE, Lexapro  15 Ativan    Ambien  Gabapentin helps sleep and RLS  Review of Systems:  Review of Systems  Constitutional:  Positive for fatigue.  HENT:  Positive for tinnitus.   Respiratory:  Negative for shortness of breath.   Cardiovascular:  Negative for palpitations.  Gastrointestinal:  Positive for constipation. Negative for abdominal pain and diarrhea.       Frequent BM  Musculoskeletal:  Positive for gait problem.  Neurological:  Positive for dizziness, tremors and weakness.  Psychiatric/Behavioral:  Positive for dysphoric mood and sleep disturbance. Negative for behavioral problems, confusion, decreased concentration, hallucinations, self-injury and suicidal ideas. The patient is nervous/anxious. The patient is not hyperactive.   GI px managed.  Medications: I have reviewed the patient's current medications.  Current Outpatient Medications  Medication Sig Dispense Refill   amLODipine  (NORVASC ) 5 MG tablet Take 1 tablet (5 mg total) by mouth daily. (Patient taking differently: Take 2.5 mg by mouth daily.) 30 tablet 0   gabapentin (NEURONTIN) 100 MG capsule Take 100 mg by mouth at bedtime.     levothyroxine (SYNTHROID) 25 MCG tablet Take 25 mcg by mouth daily.      lisinopril  (ZESTRIL ) 10 MG tablet Take 40 mg by mouth daily. (Patient taking differently: Take 10 mg by mouth daily.)     zolpidem  (AMBIEN ) 5 MG tablet Take 1 tablet (5 mg total) by mouth at bedtime as needed. 30 tablet 5   acetaminophen  (TYLENOL ) 325 MG tablet Take 325-650 mg by mouth every 8 (eight) hours as needed for mild pain or headache.     aspirin  EC 81 MG EC tablet Take 1 tablet (81 mg total) by mouth daily. 30 tablet 0   atorvastatin  (LIPITOR) 40 MG tablet Take 1 tablet (40 mg total) by mouth daily at 6 PM. 30 tablet 0   Calcium  Carbonate-Vitamin D3 600-400 MG-UNIT TABS Take 1 tablet by mouth daily.      escitalopram  (LEXAPRO ) 10 MG tablet Take 1 tablet (10 mg total) by mouth daily. 90 tablet 1   loratadine  (CLARITIN ) 10 MG tablet Take 10 mg by mouth daily as needed for allergies or rhinitis.      LORazepam  (ATIVAN ) 0.5 MG tablet 1 at 5 PM 30 tablet 3   LORazepam  (ATIVAN ) 1 MG tablet 1 in the AM and HS 60 tablet 3   Methylcellulose, Laxative, (CITRUCEL PO) Take by mouth See admin instructions. Mix 4.5 heaping teaspoonsful of powder into 8 ounces of water and drink once a day at bedtime (Patient not taking: Reported on 10/19/2024)     mirtazapine  (REMERON ) 45 MG tablet Take 1 tablet (45 mg total) by mouth at bedtime. 90 tablet 1   Multiple Vitamin (MULTIVITAMIN WITH MINERALS) TABS tablet Take 1 tablet by mouth daily.     phenylephrine-shark liver oil-mineral oil-petrolatum (PREPARATION H) 0.25-14-74.9 % rectal ointment Place 1 application rectally 2 (two) times daily as needed for hemorrhoids.     polyethylene glycol (MIRALAX  / GLYCOLAX ) 17 g packet Take 17 g by mouth daily. (Patient not taking: Reported on 10/19/2024)     simethicone  (MYLICON) 125 MG chewable tablet Chew  125 mg by mouth every 6 (six) hours as needed. (Patient not taking: Reported on 10/19/2024)     No current facility-administered medications for this visit.    Medication Side Effects: Other: ? sedation related.  Allergies:   Allergies  Allergen Reactions   Adhesive [Tape] Other (See Comments)    Caused SEVERE skin irritation   Cefdinir Other (See Comments)    Caused C-Diff   Amitiza  [Lubiprostone ] Diarrhea   Erythromycin Nausea And Vomiting   Flagyl [Metronidazole] Diarrhea   Meloxicam Other (See Comments)    Dizziness resulted after taking it     Past Medical History:  Diagnosis Date   Allergy    Anal fissure    Anxiety    Basal cell carcinoma    Cataracts, bilateral    Clostridium difficile diarrhea    Depression    Diverticulosis    hx of stricture in colon   Hemorrhoids    Hypertension    IBS (irritable bowel syndrome)    Insomnia    Migraine    Osteopenia    Thrombosed hemorrhoids     Family History  Problem Relation Age of Onset   Alzheimer's disease Mother    Heart failure Mother    Heart disease Father    Heart failure Father    Other Maternal Grandfather        cerebral hemorrhage   Skin cancer Brother    Heart disease Brother    Colon cancer Neg Hx    Stomach cancer Neg Hx    Pancreatic cancer Neg Hx     Social History   Socioeconomic History   Marital status: Married    Spouse name: Solicitor   Number of children: 2   Years of education: MA   Highest education level: Not on file  Occupational History   Occupation: retired runner, broadcasting/film/video  Tobacco Use   Smoking status: Never   Smokeless tobacco: Never  Vaping Use   Vaping status: Never Used  Substance and Sexual Activity   Alcohol use: Yes    Alcohol/week: 0.0 standard drinks of alcohol    Comment: occasional glass of wine -2 per week   Drug use: No   Sexual activity: Not on file  Other Topics Concern   Not on file  Social History Narrative   Pt lives at home with her Spouse. She has 3 children, (1 adopted). Spouse is a retired development worker, international aid.   Caffeine  Use: 1-2 cups daily.   Social Drivers of Health   Tobacco Use: Low Risk (10/19/2024)   Patient History    Smoking Tobacco Use: Never    Smokeless Tobacco Use:  Never    Passive Exposure: Not on file  Recent Concern: Tobacco Use - Medium Risk (07/31/2024)   [facility]   Patient History    Passive Exposure: Not on file    Smokeless Tobacco Use: Former    Smoking Tobacco Use: Never  Physicist, Medical Strain: Low Risk (02/05/2023)   Overall Financial Resource Strain (CARDIA)    Difficulty of Paying Living Expenses: Not hard at all  Food Insecurity: Low Risk (02/07/2024)   Received from [facility]   Epic    Within the past 12 months, you worried that your food would run out before you got money to buy more: Never true    Within the past 12 months, the food you bought just didn't last and you didn't have money to get more. : Never true  Transportation Needs: No Transportation Needs (  02/07/2024)   Received from [facility]   Transportation    In the past 12 months, has lack of reliable transportation kept you from medical appointments, meetings, work or from getting things needed for daily living? : No  Physical Activity: Insufficiently Active (02/05/2023)   Exercise Vital Sign    Days of Exercise per Week: 3 days    Minutes of Exercise per Session: 30 min  Stress: No Stress Concern Present (02/05/2023)   [employer] of Occupational Health - Occupational Stress Questionnaire    Feeling of Stress : Not at all  Social Connections: Socially Isolated (02/05/2023)   Social Connection and Isolation Panel    Frequency of Communication with Friends and Family: More than three times a week    Frequency of Social Gatherings with Friends and Family: Once a week    Attends Religious Services: Never    Database Administrator or Organizations: No    Attends Banker Meetings: Never    Marital Status: Widowed  Intimate Partner Violence: Not At Risk (02/05/2023)   Humiliation, Afraid, Rape, and Kick questionnaire    Fear of Current or Ex-Partner: No    Emotionally Abused: No    Physically Abused: No    Sexually Abused: No   Depression (PHQ2-9): Low Risk (02/05/2023)   Depression (PHQ2-9)    PHQ-2 Score: 0  Alcohol Screen: Low Risk (02/05/2023)   Alcohol Screen    Last Alcohol Screening Score (AUDIT): 0  Housing: Low Risk (02/07/2024)   Received from [facility]   Epic    What is your living situation today?: I have a steady place to live    Think about the place you live. Do you have problems with any of the following? Choose all that apply:: None/None on this list  Utilities: Low Risk (02/07/2024)   Received from [facility]   Utilities    In the past 12 months has the electric, gas, oil, or water company threatened to shut off services in your home? : No  Health Literacy: Not on file    Past Medical History, Surgical history, Social history, and Family history were reviewed and updated as appropriate.   Please see review of systems for further details on the patient's review from today.   Objective:   Physical Exam:  There were no vitals taken for this visit.  Physical Exam Constitutional:      Appearance: Normal appearance.  Neurological:     Mental Status: She is alert.     Motor: Weakness present. No tremor.     Gait: Gait abnormal.     Comments: Walker   Psychiatric:        Attention and Perception: Attention and perception normal.        Mood and Affect: Mood is anxious. Mood is not depressed.        Speech: Speech normal. Speech is not slurred.        Behavior: Behavior is not slowed. Behavior is cooperative.        Thought Content: Thought content is not delusional. Thought content does not include homicidal or suicidal ideation.        Cognition and Memory: Cognition normal.     Comments: Insight and judgment good. Chronic anxiety better with incr BZ Rigidity of thought less prominent  Good cognition for age.      Lab Review:     Component Value Date/Time   NA 138 12/20/2019 1140   K 4.4 12/20/2019 1140  CL 102 12/20/2019 1140   CO2 26 12/20/2019 1140   GLUCOSE 106  (H) 12/20/2019 1140   BUN 12 12/20/2019 1140   CREATININE 0.96 12/20/2019 1140   CALCIUM  9.9 12/20/2019 1140   PROT 7.0 12/20/2019 1140   ALBUMIN 4.0 12/20/2019 1140   AST 32 12/20/2019 1140   ALT 16 12/20/2019 1140   ALKPHOS 71 12/20/2019 1140   BILITOT 0.7 12/20/2019 1140   GFRNONAA 53 (L) 12/20/2019 1140   GFRAA >60 12/20/2019 1140       Component Value Date/Time   WBC 5.7 12/20/2019 1140   RBC 4.11 12/20/2019 1140   HGB 12.6 12/20/2019 1140   HCT 38.6 12/20/2019 1140   PLT 244 12/20/2019 1140   MCV 93.9 12/20/2019 1140   MCH 30.7 12/20/2019 1140   MCHC 32.6 12/20/2019 1140   RDW 11.5 12/20/2019 1140   LYMPHSABS 2.2 12/20/2019 1140   MONOABS 0.4 12/20/2019 1140   EOSABS 0.1 12/20/2019 1140   BASOSABS 0.1 12/20/2019 1140    No results found for: POCLITH, LITHIUM   No results found for: PHENYTOIN, PHENOBARB, VALPROATE, CBMZ   .res Assessment: Plan:    [place] was seen today for follow-up, anxiety, sleeping problem and medication reaction.  Diagnoses and all orders for this visit:  Generalized anxiety disorder -     mirtazapine  (REMERON ) 45 MG tablet; Take 1 tablet (45 mg total) by mouth at bedtime. -     LORazepam  (ATIVAN ) 0.5 MG tablet; 1 at 5 PM -     LORazepam  (ATIVAN ) 1 MG tablet; 1 in the AM and HS -     escitalopram  (LEXAPRO ) 10 MG tablet; Take 1 tablet (10 mg total) by mouth daily.  Insomnia due to mental condition -     mirtazapine  (REMERON ) 45 MG tablet; Take 1 tablet (45 mg total) by mouth at bedtime. -     LORazepam  (ATIVAN ) 1 MG tablet; 1 in the AM and HS  Neurodermatitis      30 min face to face time with patient was spent on counseling and coordination of care. We discussed Ms. Weidinger has a long history of anxiety which in the past had contributed to unmanageable diarrhea another bowel symptoms including bowel pain.  These symptoms were much improved with a combination of lorazepam  and mirtazapine .  She has some chronic insomnia  which is generally manageable with the medication until situationally worse.    We had been able to reduce the lorazepam  some over the last couple of years and she is probably at the lowest tolerated dose and lowest effective dose. Lexapro  helped some.  Anxiety is chronic but not disabling.  It would help if could get lorazepam  when awakens at 630 AM but doesn't get it until 9 AM.  Better after lorazepam . .  She's satisfied with meds otherwise.  Pt does not describe dysphoric OC sx.  Not depressed. Disc grief process.  Has moved into acceptance stage as expeced.   Overall doing well .  No med changes  Med sensitive.  Reviewed MAR from [employer].  She takes her owqn meds.  Previously Disc relationship between IBS and diarrhea alternating with constipation.    Was Better after Lexapro  10 mg daily increased on 07/29/20.   Consider further increase to 15 mg later.  She's reluctant to change but might help irritability.  Continue mirtazapiine 45 mg HS which helped anxiety and stomach problems. IBS diarrhea better with increase.    Anxiety worse and diarrhea worse with less  lorazepam .  So per her request increase back to lorazepam  1 mg at 7A and HS takes 0.5 mg  at 5 PM.    Sleep better with gabapentin.  Disc timing of meds. Cannot go to lower dose.    We discussed the short-term risks associated with benzodiazepines including sedation and increased fall risk among others.  Discussed long-term side effect risk including dependence, potential withdrawal symptoms, and the potential eventual dose-related risk of dementia.  But recent studies from 2020 dispute this association between benzodiazepines and dementia risk. Newer studies in 2020 do not support an association with dementia. No evidence that BZ affecting balance.  Disc option to reduce but she doesn't feel she can reduce it.  Objective, subjective reasons to believe BZ works.  Counseling 20 min: on depression and need for social  interaction which clearly brings her out of it.  Also keeping up with children and gchildren.    Use Ambien  regularly now.  Disc risk amnesia.    She needs the meds.  No changes:   Med: :Lexapro  10, mirtazapine  45 mg HS, gabapentin 100 mg PM, lorazepam  0.5 mg BID and 1 mg HS. Levothyroxine 25 AM, zolpidem  5 mg HS nightly  Disc option cotton gloves for compulsive scratching or poss going up in Lexapro .  She doesn't want to take take any more pills.   Option NAC off label.   Option increasing gabapentin.    This appt was 30 mins.  FU 4-6 mos  Lorene Macintosh, MD, DFAPA   Please see After Visit Summary for patient specific instructions.  Future Appointments  Date Time Provider Department Center  03/19/2025  2:30 PM Cottle, Lorene KANDICE Raddle., MD [facility] None     No orders of the defined types were placed in this encounter.     -------------------------------

## 2025-03-19 ENCOUNTER — Ambulatory Visit: Admitting: Psychiatry
